# Patient Record
Sex: Male | Born: 1950 | Race: White | Hispanic: No | Smoking: Never smoker
Health system: Southern US, Community
[De-identification: ages and names within clinical notes are randomized; demographics above are authoritative.]

## PROBLEM LIST (undated history)

## (undated) DIAGNOSIS — G8929 Other chronic pain: Secondary | ICD-10-CM

## (undated) DIAGNOSIS — E785 Hyperlipidemia, unspecified: Secondary | ICD-10-CM

## (undated) DIAGNOSIS — E119 Type 2 diabetes mellitus without complications: Secondary | ICD-10-CM

## (undated) DIAGNOSIS — I480 Paroxysmal atrial fibrillation: Secondary | ICD-10-CM

## (undated) DIAGNOSIS — I809 Phlebitis and thrombophlebitis of unspecified site: Secondary | ICD-10-CM

## (undated) DIAGNOSIS — M549 Dorsalgia, unspecified: Secondary | ICD-10-CM

## (undated) DIAGNOSIS — I2510 Atherosclerotic heart disease of native coronary artery without angina pectoris: Secondary | ICD-10-CM

## (undated) DIAGNOSIS — I4892 Unspecified atrial flutter: Secondary | ICD-10-CM

## (undated) DIAGNOSIS — I10 Essential (primary) hypertension: Secondary | ICD-10-CM

## (undated) DIAGNOSIS — I8390 Asymptomatic varicose veins of unspecified lower extremity: Secondary | ICD-10-CM

## (undated) HISTORY — DX: Phlebitis and thrombophlebitis of unspecified site: I80.9

## (undated) HISTORY — DX: Other chronic pain: G89.29

## (undated) HISTORY — DX: Essential (primary) hypertension: I10

## (undated) HISTORY — PX: CHOLECYSTECTOMY: SHX55

## (undated) HISTORY — DX: Type 2 diabetes mellitus without complications: E11.9

## (undated) HISTORY — DX: Dorsalgia, unspecified: M54.9

## (undated) HISTORY — DX: Paroxysmal atrial fibrillation: I48.0

## (undated) HISTORY — DX: Hyperlipidemia, unspecified: E78.5

## (undated) HISTORY — DX: Unspecified atrial flutter: I48.92

## (undated) HISTORY — DX: Asymptomatic varicose veins of unspecified lower extremity: I83.90

## (undated) HISTORY — DX: Atherosclerotic heart disease of native coronary artery without angina pectoris: I25.10

---

## 2002-03-19 HISTORY — PX: CORONARY ARTERY BYPASS GRAFT: SHX141

## 2003-01-12 ENCOUNTER — Inpatient Hospital Stay (HOSPITAL_COMMUNITY): Admission: AD | Admit: 2003-01-12 | Discharge: 2003-01-22 | Payer: Self-pay

## 2003-01-13 ENCOUNTER — Encounter: Payer: Self-pay | Admitting: Cardiology

## 2003-04-05 ENCOUNTER — Inpatient Hospital Stay (HOSPITAL_COMMUNITY): Admission: EM | Admit: 2003-04-05 | Discharge: 2003-04-06 | Payer: Self-pay | Admitting: *Deleted

## 2004-12-14 ENCOUNTER — Emergency Department (HOSPITAL_COMMUNITY): Admission: EM | Admit: 2004-12-14 | Discharge: 2004-12-14 | Payer: Self-pay | Admitting: Emergency Medicine

## 2004-12-19 ENCOUNTER — Ambulatory Visit: Payer: Self-pay | Admitting: Cardiology

## 2006-03-19 HISTORY — PX: CARDIAC CATHETERIZATION: SHX172

## 2006-12-16 ENCOUNTER — Ambulatory Visit: Payer: Self-pay | Admitting: *Deleted

## 2006-12-16 ENCOUNTER — Inpatient Hospital Stay (HOSPITAL_COMMUNITY): Admission: EM | Admit: 2006-12-16 | Discharge: 2006-12-18 | Payer: Self-pay | Admitting: Emergency Medicine

## 2006-12-17 ENCOUNTER — Encounter: Payer: Self-pay | Admitting: Cardiology

## 2006-12-17 ENCOUNTER — Ambulatory Visit: Payer: Self-pay | Admitting: Surgery

## 2007-01-09 ENCOUNTER — Ambulatory Visit: Payer: Self-pay | Admitting: Cardiology

## 2007-12-16 ENCOUNTER — Emergency Department (HOSPITAL_COMMUNITY): Admission: EM | Admit: 2007-12-16 | Discharge: 2007-12-16 | Payer: Self-pay | Admitting: Emergency Medicine

## 2008-07-12 ENCOUNTER — Emergency Department (HOSPITAL_COMMUNITY): Admission: EM | Admit: 2008-07-12 | Discharge: 2008-07-12 | Payer: Self-pay | Admitting: Emergency Medicine

## 2008-07-12 ENCOUNTER — Encounter: Payer: Self-pay | Admitting: Cardiology

## 2008-07-30 ENCOUNTER — Encounter: Payer: Self-pay | Admitting: Cardiology

## 2008-07-30 ENCOUNTER — Ambulatory Visit: Payer: Self-pay | Admitting: Cardiology

## 2008-08-02 ENCOUNTER — Encounter: Payer: Self-pay | Admitting: Cardiology

## 2008-08-02 ENCOUNTER — Ambulatory Visit: Payer: Self-pay | Admitting: Cardiology

## 2008-08-03 ENCOUNTER — Encounter: Payer: Self-pay | Admitting: Cardiology

## 2008-08-05 ENCOUNTER — Ambulatory Visit: Payer: Self-pay | Admitting: Cardiology

## 2008-08-06 ENCOUNTER — Ambulatory Visit: Payer: Self-pay | Admitting: Cardiology

## 2008-12-03 ENCOUNTER — Encounter: Admission: RE | Admit: 2008-12-03 | Discharge: 2008-12-03 | Payer: Self-pay | Admitting: Endocrinology

## 2009-01-03 DIAGNOSIS — I2510 Atherosclerotic heart disease of native coronary artery without angina pectoris: Secondary | ICD-10-CM

## 2009-01-03 DIAGNOSIS — R609 Edema, unspecified: Secondary | ICD-10-CM | POA: Insufficient documentation

## 2009-03-19 DIAGNOSIS — I4892 Unspecified atrial flutter: Secondary | ICD-10-CM

## 2009-03-19 HISTORY — DX: Unspecified atrial flutter: I48.92

## 2009-06-17 ENCOUNTER — Ambulatory Visit: Payer: Self-pay | Admitting: Cardiology

## 2009-06-17 DIAGNOSIS — E782 Mixed hyperlipidemia: Secondary | ICD-10-CM

## 2009-09-02 ENCOUNTER — Telehealth (INDEPENDENT_AMBULATORY_CARE_PROVIDER_SITE_OTHER): Payer: Self-pay | Admitting: *Deleted

## 2009-09-21 ENCOUNTER — Encounter: Payer: Self-pay | Admitting: Cardiology

## 2009-09-26 ENCOUNTER — Encounter (INDEPENDENT_AMBULATORY_CARE_PROVIDER_SITE_OTHER): Payer: Self-pay | Admitting: Surgery

## 2009-09-26 ENCOUNTER — Encounter: Payer: Self-pay | Admitting: Cardiology

## 2009-09-26 ENCOUNTER — Ambulatory Visit: Payer: Self-pay | Admitting: Cardiology

## 2009-09-26 ENCOUNTER — Ambulatory Visit (HOSPITAL_COMMUNITY): Admission: RE | Admit: 2009-09-26 | Discharge: 2009-09-28 | Payer: Self-pay | Admitting: Surgery

## 2009-10-06 ENCOUNTER — Ambulatory Visit: Payer: Self-pay | Admitting: Cardiology

## 2009-10-06 ENCOUNTER — Telehealth (INDEPENDENT_AMBULATORY_CARE_PROVIDER_SITE_OTHER): Payer: Self-pay | Admitting: *Deleted

## 2009-10-06 DIAGNOSIS — I4892 Unspecified atrial flutter: Secondary | ICD-10-CM

## 2009-10-06 DIAGNOSIS — I10 Essential (primary) hypertension: Secondary | ICD-10-CM | POA: Insufficient documentation

## 2009-10-11 ENCOUNTER — Ambulatory Visit: Payer: Self-pay | Admitting: Cardiology

## 2009-10-11 LAB — CONVERTED CEMR LAB: POC INR: 1.2

## 2009-10-18 ENCOUNTER — Ambulatory Visit: Payer: Self-pay | Admitting: Cardiology

## 2009-10-18 LAB — CONVERTED CEMR LAB: POC INR: 1.6

## 2009-10-19 ENCOUNTER — Emergency Department (HOSPITAL_COMMUNITY): Admission: EM | Admit: 2009-10-19 | Discharge: 2009-10-20 | Payer: Self-pay | Admitting: Emergency Medicine

## 2009-10-25 ENCOUNTER — Ambulatory Visit: Payer: Self-pay | Admitting: Cardiology

## 2009-10-25 LAB — CONVERTED CEMR LAB: POC INR: 4.5

## 2009-11-01 ENCOUNTER — Ambulatory Visit: Payer: Self-pay | Admitting: Cardiology

## 2009-11-01 LAB — CONVERTED CEMR LAB: POC INR: 2.1

## 2009-11-11 ENCOUNTER — Ambulatory Visit: Payer: Self-pay | Admitting: Cardiology

## 2009-11-11 ENCOUNTER — Encounter: Payer: Self-pay | Admitting: Cardiology

## 2009-11-11 LAB — CONVERTED CEMR LAB: POC INR: 4

## 2009-11-18 ENCOUNTER — Ambulatory Visit: Payer: Self-pay | Admitting: Cardiology

## 2009-11-18 ENCOUNTER — Encounter (INDEPENDENT_AMBULATORY_CARE_PROVIDER_SITE_OTHER): Payer: Self-pay | Admitting: *Deleted

## 2009-11-18 DIAGNOSIS — M109 Gout, unspecified: Secondary | ICD-10-CM

## 2009-11-18 LAB — CONVERTED CEMR LAB: POC INR: 3.4

## 2009-11-24 ENCOUNTER — Ambulatory Visit (HOSPITAL_COMMUNITY): Admission: RE | Admit: 2009-11-24 | Discharge: 2009-11-24 | Payer: Self-pay | Admitting: Cardiology

## 2009-11-24 ENCOUNTER — Ambulatory Visit: Payer: Self-pay | Admitting: Cardiology

## 2009-11-28 ENCOUNTER — Encounter: Payer: Self-pay | Admitting: Physician Assistant

## 2009-11-29 ENCOUNTER — Ambulatory Visit: Payer: Self-pay | Admitting: Cardiology

## 2009-11-29 LAB — CONVERTED CEMR LAB: POC INR: 3

## 2009-12-06 ENCOUNTER — Ambulatory Visit: Payer: Self-pay | Admitting: Cardiology

## 2009-12-06 LAB — CONVERTED CEMR LAB: POC INR: 2

## 2009-12-22 ENCOUNTER — Encounter: Payer: Self-pay | Admitting: Cardiology

## 2009-12-22 ENCOUNTER — Ambulatory Visit: Payer: Self-pay | Admitting: Cardiology

## 2009-12-22 LAB — CONVERTED CEMR LAB: POC INR: 2.4

## 2009-12-23 ENCOUNTER — Encounter: Payer: Self-pay | Admitting: Cardiology

## 2010-01-03 ENCOUNTER — Ambulatory Visit: Payer: Self-pay | Admitting: Cardiology

## 2010-01-03 LAB — CONVERTED CEMR LAB: POC INR: 2.2

## 2010-02-14 ENCOUNTER — Ambulatory Visit: Payer: Self-pay | Admitting: Cardiology

## 2010-02-14 LAB — CONVERTED CEMR LAB: POC INR: 1.2

## 2010-02-28 ENCOUNTER — Ambulatory Visit: Payer: Self-pay | Admitting: Cardiology

## 2010-03-28 ENCOUNTER — Ambulatory Visit: Admission: RE | Admit: 2010-03-28 | Discharge: 2010-03-28 | Payer: Self-pay | Source: Home / Self Care

## 2010-04-10 ENCOUNTER — Encounter: Payer: Self-pay | Admitting: Cardiology

## 2010-04-18 NOTE — Assessment & Plan Note (Signed)
Summary: EPH-PER [name] [name] NEEDS TOBE EVAL   Primary Provider:  Dr. [name]   History of Present Illness: 60 year old male presents for followup. He was recently seen in consultation by Dr. Daleen Squibb on 11 July related to atrial fibrillation documented in the setting of an elective laparoscopic cholecystectomy. I cannot locate a discharge summary, however the patient tells me that no new cardiac medications were added, and followup was scheduled with me to discuss further options for management.  He reports recuperating from surgery with some mild incisional discomfort, normal appetite, no bleeding issues. He has followup soon with his surgeon.  Recent labs from 6 July showed hemoglobin 14.9, platelets 153, sodium 135, potassium 5.1, BUN 15, creatinine 1.0, AST 37, ALT 31.   We discussed the implications and treatment options for atrial fibrillation and atrial flutter. His CHADS2 score is 2. This is his first documented episode, and fortunately he is not particularly symptomatic.  Preventive Screening-Counseling & Management  Alcohol-Tobacco     Smoking Status: never  Current Medications (verified): 1)  Lisinopril 20 Mg Tabs (Lisinopril) .... Take One Tablet By Mouth Daily 2)  Furosemide 40 Mg Tabs (Furosemide) .... Take One Tablet By Mouth Daily As Needed 3)  Metoprolol Tartrate 50 Mg Tabs (Metoprolol Tartrate) .... Take One Tablet By Mouth Twice A Day 4)  Ecotrin 325 Mg Tbec (Aspirin) .... Take 1 Tablet By Mouth Once A Day 5)  Metformin Hcl 500 Mg Tabs (Metformin Hcl) .... Take 1 Tablet By Mouth Two Times A Day 6)  Nitrostat 0.4 Mg Subl (Nitroglycerin) .... Use As Directed 7)  Actos 45 Mg Tabs (Pioglitazone Hcl) .... Take 1 Tablet By Mouth Once A Day  Allergies (verified): 1)  * Shellfish  Comments:  Nurse/Medical Assistant: The patient's medications and allergies were verbally reviewed with the patient and were updated in the Medication and Allergy Lists.  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         No palpitations. Some residual incisional pain. No bleeding problems. Otherwise reviewed and negative.  Vital Signs:  Patient profile:   60 year old male Height:      70 inches Weight:      242 pounds Pulse rate:   71 / minute BP sitting:   125 / 79  (left arm) Cuff size:   large  Vitals Entered By: Carlye Grippe (October 06, 2009 10:30 AM)  Physical Exam  Additional Exam:  Obese male no acute distress. HEENT: Conjunctiva and lids are normal, oropharynx clear. Neck: Supple, no elevated JVP or bruits. Lungs: Clear auscultation, nonlabored. Cardiac: Irregularly irregular rate and rhythm, no S3. Abdomen: Obese, status post laparoscopic cholecystectomy, bowel sounds present. Extremities: Venous stasis, distal pulses one plus. Skin: Warm and dry. Musculoskeletal: No kyphosis. Neuropsychiatric: Alert and oriented x3, affect appropriate.   CXR  Procedure date:  09/21/2009  Findings:      CHEST - 2 VIEW    Comparison: 07/12/2008    Findings: Stable cardiomegaly/post  CABG.  No congestive heart   failure or active disease.    IMPRESSION:   Cardiomegaly/post CABG.  No active disease or interval change.  Echocardiogram  Procedure date:  08/05/2008  Findings:      Mild LVH with LVEF 55-60%, mild to moderate left atrial enlargement, mild mitral regurgitation, mild aortic  leaflet calcification, mild aortic calcification, normal RV function, mild tricuspid regurgitation, RVSP 30 mm mercury.  EKG  Procedure date:  10/06/2009  Findings:      Atrial flutter with variable block, heart rate 78 beats per minute, nonspecific ST-T changes.  Impression & Recommendations:  Problem # 1:  ATRIAL FLUTTER (ICD-427.32)  Largely asymptomatic at this time with no sense of palpitations. Patient was reported to develop perioperative atrial fibrillation around the time of elective laparoscopic cholecystectomy in mid July. Presumably this has modified to atrial flutter in the meanwhile. His atrial arrhythmia has persisted. His CHADS2 score is 2. We discussed implications and treatment options for his atrial arrhythmias. At this point our plan is to initiate Coumadin, reduce aspirin to 81 mg daily, and follow PT/INR checks on a weekly basis, in anticipation of elective cardioversion attempt in 4 weeks. It is not entirely clear whether he will need continued anticoagulant coagulation long term, in light of the fact that this is his first documented episode of atrial fibrillation or flutter. We can readdress this as needed. Coumadin followup will be established in our Coumadin clinic. He would like to have his cardioversion done at [facility].  His updated medication list for this problem includes:    Metoprolol Tartrate 50 Mg Tabs (Metoprolol tartrate) .Marland Kitchen... Take one tablet by mouth twice a day    Aspirin 81 Mg Tbec (Aspirin) .Marland Kitchen... Take one tablet by mouth daily    Warfarin Sodium 5 Mg Tabs (Warfarin sodium) ..... Use as directed by anticoagulation clinic  Problem # 2:  CAD, NATIVE VESSEL (ICD-414.01)  Symptomatically stable without active angina.  His updated medication list for this problem includes:    Lisinopril 20 Mg Tabs (Lisinopril) .Marland Kitchen... Take one tablet by mouth daily    Metoprolol Tartrate 50 Mg Tabs (Metoprolol tartrate) .Marland Kitchen... Take one tablet by mouth twice a day     Aspirin 81 Mg Tbec (Aspirin) .Marland Kitchen... Take one tablet by mouth daily    Nitrostat 0.4 Mg Subl (Nitroglycerin) ..... Use as directed    Warfarin Sodium 5 Mg Tabs (Warfarin sodium) ..... Use as directed by anticoagulation clinic  Orders: EKG w/ Interpretation (93000)  Problem # 3:  ESSENTIAL HYPERTENSION, BENIGN (ICD-401.1)  Blood pressure well controlled today.  His updated medication list for this problem includes:    Lisinopril 20 Mg Tabs (Lisinopril) .Marland Kitchen... Take one tablet by mouth daily    Furosemide 40 Mg Tabs (Furosemide) .Marland Kitchen... Take one tablet by mouth daily as needed    Metoprolol Tartrate 50 Mg Tabs (Metoprolol tartrate) .Marland Kitchen... Take one tablet by mouth twice a day    Aspirin 81 Mg Tbec (Aspirin) .Marland Kitchen... Take one tablet by mouth daily  Patient Instructions: 1)  Start Coumadin (warfarin) today. Take 5mg  by mouth every afternoon (around 4 or 5pm).  2)  Return for Coumadin Clinic visit on Tuesday, October 11, 2009 at 1:30pm. 3)  Return for appt with Dr. Diona Browner on Friday, November 18, 2009 at 1:40pm. 4)  Decrease Aspirin to 81mg  by mouth once daily. Prescriptions: WARFARIN SODIUM 5 MG TABS (WARFARIN SODIUM) Use as directed by Anticoagulation Clinic  #45 x 1   Entered by:  Cyril Loosen, RN, BSN   Authorized by:   Loreli Slot, MD, [facility]   Signed by:   Cyril Loosen, RN, BSN on 10/06/2009   Method used:   Electronically to        [facility]  [address] [id number] (retail)       [address]       [place], [place]  [address]       Ph: [phone] or [phone]       Fax: [phone]   RxID:   [id number]

## 2010-04-18 NOTE — Medication Information (Signed)
Summary: ccr-lr  Anticoagulant Therapy  Managed by: Vashti Hey RN PCP: Dr. [name] Supervising MD: Antoine Poche MD, Fayrene Fearing Indication 1: Atrial Fibrillation Indication 2: Atrial Flutter Lab Used: [facility]  Site: [place] INR POC 4.5  Dietary changes: no    Health status changes: yes       Details: had gout flare up and went to [facility]   Bleeding/hemorrhagic complications: no    Recent/future hospitalizations: no    Any changes in medication regimen? yes       Details: Been taking prednisone taper pack for gout   Has 2 days left  Recent/future dental: no  Any missed doses?: no       Is patient compliant with meds? yes       Allergies: 1)  * Shellfish  Anticoagulation Management History:      The patient is taking warfarin and comes in today for a routine follow up visit.  He is being anticoagulated due to Atrial Fibrillation /flutter.  Positive risk factors for bleeding include presence of serious comorbidities.  Negative risk factors for bleeding include an age less than 74 years old, no history of CVA/TIA, and no history of GI bleeding.  The bleeding index is 'intermediate risk'.  Positive CHADS2 values include History of HTN and History of Diabetes.  Negative CHADS2 values include History of CHF, Age > 74 years old, and Prior Stroke/CVA/TIA.  The start date was 10/06/2009.  Anticoagulation responsible Alverta Caccamo: Antoine Poche MD, Fayrene Fearing.  INR POC: 4.5.  Cuvette Lot#: [id number].    Anticoagulation Management Assessment/Plan:      The patient's current anticoagulation dose is Warfarin sodium 5 mg tabs: Use as directed by Anticoagulation Clinic.  The target INR is 2.0-3.0.  The next INR is due 11/01/2009.  Anticoagulation instructions were given to patient.  Results were reviewed/authorized by Vashti Hey RN.  He was notified by Vashti Hey RN.         Prior Anticoagulation Instructions: INR 1.6 Increase coumadin to 7.5mg  once daily except 5mg  on Sundays  Current  Anticoagulation Instructions: INR 4.5 Hold coumadin tonight, take 1/2 tablet tomorrow night then 7.5mg  once daily except 5mg  on Thursday and Saturday

## 2010-04-18 NOTE — Medication Information (Signed)
Summary: Coumadin Clinic  Anticoagulant Therapy  Managed by: Cyril Loosen, RN PCP: Dr. Perrin Maltese MD: Andee Lineman MD, Michelle Piper Indication 1: Atrial Fibrillation Indication 2: Atrial Flutter Lab Used: [facility] [place] Site: [place] INR POC 2.4  Dietary changes: no    Health status changes: no    Bleeding/hemorrhagic complications: no    Recent/future hospitalizations: no    Any changes in medication regimen? no    Recent/future dental: no  Any missed doses?: yes     Details: pt has been taking 5mg  qd   Is patient compliant with meds? no     Details: cancelled last office visit and decreased coumadin dose   Allergies: 1)  * Shellfish  Anticoagulation Management History:      The patient is taking warfarin and comes in today for a routine follow up visit.  He is being anticoagulated because of Atrial Fibrillation /flutter.  Positive risk factors for bleeding include presence of serious comorbidities.  Negative risk factors for bleeding include an age less than 30 years old, no history of CVA/TIA, and no history of GI bleeding.  The bleeding index is 'intermediate risk'.  Positive CHADS2 values include History of HTN and History of Diabetes.  Negative CHADS2 values include History of CHF, Age > 4 years old, and Prior Stroke/CVA/TIA.  The start date was 10/06/2009.  Anticoagulation responsible provider: Andee Lineman MD, Michelle Piper.  INR POC: 2.4.  Cuvette Lot#: [id number].    Anticoagulation Management Assessment/Plan:      The patient's current anticoagulation dose is Warfarin sodium 5 mg tabs: Use as directed by Anticoagulation Clinic.  The target INR is 2.0-3.0.  The next INR is due 01/03/2010.  Anticoagulation instructions were given to patient.  Results were reviewed/authorized by Cyril Loosen, RN.  He was notified by Vashti Hey RN.         Prior Anticoagulation Instructions: INR 2.0 Take coumadin 7.5mg  tonight then resume 5mg  once daily except 7.5mg  on  Thursdays  Current Anticoagulation Instructions: INR 2.4 Pt has been taking 5mg  once daily  Continue 5mg  once daily

## 2010-04-18 NOTE — Medication Information (Signed)
Summary: ONG:EXBMW 10/06/09-JM  Anticoagulant Therapy  Managed by: Vashti Hey RN PCP: Dr. [name] Supervising MD: Andee Lineman MD, Michelle Piper Indication 1: Atrial Fibrillation Indication 2: Atrial Flutter Lab Used: [facility] [place] Site: [place] INR POC 1.2  Dietary changes: no    Health status changes: no    Bleeding/hemorrhagic complications: no    Recent/future hospitalizations: yes       Details: pending DCCV    new onset atrial fib/flutter in hospital  Any changes in medication regimen? yes       Details: started on coumadin 5mg  qd 10/06/09   Has 5mg  tablet  Recent/future dental: no  Any missed doses?: no       Is patient compliant with meds? yes       Allergies: 1)  * Shellfish  Anticoagulation Management History:      The patient comes in today for his initial visit for anticoagulation therapy.  He is being anticoagulated due to Atrial Fibrillation /flutter.  Positive risk factors for bleeding include presence of serious comorbidities.  Negative risk factors for bleeding include an age less than 12 years old, no history of CVA/TIA, and no history of GI bleeding.  The bleeding index is 'intermediate risk'.  Positive CHADS2 values include History of HTN and History of Diabetes.  Negative CHADS2 values include History of CHF, Age > 6 years old, and Prior Stroke/CVA/TIA.  The start date was 10/06/2009.  Anticoagulation responsible provider: Andee Lineman MD, Michelle Piper.  INR POC: 1.2.    Anticoagulation Management Assessment/Plan:      The patient's current anticoagulation dose is Warfarin sodium 5 mg tabs: Use as directed by Anticoagulation Clinic.  The target INR is 2.0-3.0.  The next INR is due 10/18/2009.  Anticoagulation instructions were given to patient.  Results were reviewed/authorized by Vashti Hey RN.  He was notified by Vashti Hey RN.        Coagulation management information includes: Pending DCCV when theraputic x 4 weeks.  Current Anticoagulation Instructions: INR  1.2 Increase coumadin to 5mg  once daily except 7.5mg  on T,Th, Sat

## 2010-04-18 NOTE — Medication Information (Signed)
Summary: ccr  Anticoagulant Therapy  Managed by: Vashti Hey, RN PCP: Dr. Perrin Maltese MD: Andee Lineman MD, Michelle Piper Indication 1: Atrial Fibrillation Indication 2: Atrial Flutter Lab Used: [facility] [place] Site: [place] INR POC 1.2  Dietary changes: no    Health status changes: no    Bleeding/hemorrhagic complications: no    Recent/future hospitalizations: no    Any changes in medication regimen? no    Recent/future dental: no  Any missed doses?: yes     Details: Pt admits to missing 2 doses  Is patient compliant with meds? yes       Allergies: 1)  * Shellfish  Anticoagulation Management History:      The patient is taking warfarin and comes in today for a routine follow up visit.  He is being anticoagulated because of Atrial Fibrillation /flutter.  Positive risk factors for bleeding include presence of serious comorbidities.  Negative risk factors for bleeding include an age less than 62 years old, no history of CVA/TIA, and no history of GI bleeding.  The bleeding index is 'intermediate risk'.  Positive CHADS2 values include History of HTN and History of Diabetes.  Negative CHADS2 values include History of CHF, Age > 62 years old, and Prior Stroke/CVA/TIA.  The start date was 10/06/2009.  Anticoagulation responsible provider: Andee Lineman MD, Michelle Piper.  INR POC: 1.2.  Cuvette Lot#: [id number].    Anticoagulation Management Assessment/Plan:      The patient's current anticoagulation dose is Warfarin sodium 5 mg tabs: Use as directed by Anticoagulation Clinic.  The target INR is 2.0-3.0.  The next INR is due 02/28/2010.  Anticoagulation instructions were given to patient.  Results were reviewed/authorized by Vashti Hey, RN.  He was notified by Vashti Hey RN.         Prior Anticoagulation Instructions: INR 2.2 Continue coumadin 5mg  once daily   Current Anticoagulation Instructions: INR 1.2 Take coumadin 2 tablets tonight and tomorrow night then resume 1 tablet once daily

## 2010-04-18 NOTE — Medication Information (Signed)
Summary: ccr-lr  Anticoagulant Therapy  Managed by: Vashti Hey RN PCP: Dr. [name] Supervising MD: Antoine Poche MD, Fayrene Fearing Indication 1: Atrial Fibrillation Indication 2: Atrial Flutter Lab Used: [facility] [place] Site: [place] INR POC 2.1  Dietary changes: no    Health status changes: no    Bleeding/hemorrhagic complications: no    Recent/future hospitalizations: yes       Details: pending DCCV  Any changes in medication regimen? no    Recent/future dental: no  Any missed doses?: no       Is patient compliant with meds? yes       Allergies: 1)  * Shellfish  Anticoagulation Management History:      The patient is taking warfarin and comes in today for a routine follow up visit.  He is being anticoagulated due to Atrial Fibrillation /flutter.  Positive risk factors for bleeding include presence of serious comorbidities.  Negative risk factors for bleeding include an age less than 23 years old, no history of CVA/TIA, and no history of GI bleeding.  The bleeding index is 'intermediate risk'.  Positive CHADS2 values include History of HTN and History of Diabetes.  Negative CHADS2 values include History of CHF, Age > 8 years old, and Prior Stroke/CVA/TIA.  The start date was 10/06/2009.  Anticoagulation responsible provider: Antoine Poche MD, Fayrene Fearing.  INR POC: 2.1.  Cuvette Lot#: [id number].    Anticoagulation Management Assessment/Plan:      The patient's current anticoagulation dose is Warfarin sodium 5 mg tabs: Use as directed by Anticoagulation Clinic.  The target INR is 2.0-3.0.  The next INR is due 11/11/2009.  Anticoagulation instructions were given to patient.  Results were reviewed/authorized by Vashti Hey RN.  He was notified by Vashti Hey RN.         Prior Anticoagulation Instructions: INR 4.5 Hold coumadin tonight, take 1/2 tablet tomorrow night then 7.5mg  once daily except 5mg  on Thursday and Saturday  Current Anticoagulation Instructions: INR 2.1 Take coumadin  7.5mg  once daily except 5mg  on Mondays and Fridays

## 2010-04-18 NOTE — Assessment & Plan Note (Signed)
Summary: 1 MO F/U, POST DCCV-JM   Visit Type:  Follow-up Primary Provider:  Dr. Leandrew Koyanagi   History of Present Illness: 60 year old male presents for followup. he was seen back in early September, with plans for subsequent elective cardioversion of atrial fibrillation/flutter. Cardioversion was successful, performed by Dr. Jens Som on 8 September, with restoration of sinus rhythm. He remains on Coumadin.  He missed his last scheduled Coumadin check. He reports no bleeding problems. He reports plans to establish primary care with Dr. Leandrew Koyanagi. He has not yet seen him.  He has had no chest pain. Reports NYHA class II dyspnea on exertion. He does state that he has had some balance problems, also tingling in his feet, likely neuropathy. He was also wondering about a prior stroke, although he has had no memory problems, speech deficits, or focal motor weakness. We discussed this some today. He has a CHADS2 score of 2, and at this point I recommended that he continue on Coumadin. He asked about Pradaxa as an alternative, which would be a reasonable choice as well, assuming he is comfortable with cost difference. He plans to check into this with his insurance company.  Preventive Screening-Counseling & Management  Alcohol-Tobacco     Smoking Status: never  Current Medications (verified): 1)  Lisinopril 20 Mg Tabs (Lisinopril) .... Take One Tablet By Mouth Daily 2)  Furosemide 40 Mg Tabs (Furosemide) .... Take One Tablet By Mouth Daily As Needed 3)  Metoprolol Tartrate 50 Mg Tabs (Metoprolol Tartrate) .... Take One Tablet By Mouth Twice A Day 4)  Metformin Hcl 500 Mg Tabs (Metformin Hcl) .... Take 1 Tablet By Mouth Two Times A Day 5)  Nitrostat 0.4 Mg Subl (Nitroglycerin) .... Use As Directed 6)  Actos 45 Mg Tabs (Pioglitazone Hcl) .... Take 1 Tablet By Mouth Once A Day 7)  Warfarin Sodium 5 Mg Tabs (Warfarin Sodium) .... Use As Directed By Anticoagulation Clinic 8)  Colcrys 0.6 Mg Tabs (Colchicine)  .... Take 1 Tablet By Mouth Once A Day  Allergies (verified): 1)  * Shellfish  Comments:  Nurse/Medical Assistant: The patient's medications and allergies were verbally reviewed with the patient and were updated in the Medication and Allergy Lists.  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 The left ventricular chamber size is normal. Mild concentric left         ventricular hypertrophy is observed. Global left ventricular wall         motion and contractility are within normal limits. The estimated         ejection fraction is 55-60%.          2. The left atrium is mild to moderately dilated.          3. There is mild mitral regurgitation.          4. Mild aortic leaflet calcification is visualized. There is no         evidence of aortic regurgitation. The mean gradient of the aortic  valve is 3 mmHg.          5. The right ventricle is mildly dilated.  The right ventricular         global systolic function is normal.         6. There is mild tricuspid regurgitation. The right ventricular         systolic pressure is calculated at 30 mmHg.          7. There is no pericardial effusion. (08/05/2008)    Review of Systems  The patient denies anorexia, fever, chest pain, syncope, dyspnea on exertion, hemoptysis, melena, and hematochezia.         Otherwise reviewed and negative except as outlined above.  Vital Signs:  Patient profile:   60 year old  male Height:      70 inches Weight:      237 pounds Pulse rate:   62 / minute BP sitting:   156 / 83  (left arm) Cuff size:   large  Vitals Entered By: Carlye Grippe (December 22, 2009 1:24 PM)  Physical Exam  Additional Exam:  GEN: 60 year old male, overweight, sitting up, in no distress HEENT: NCAT,PERRLA,EOMI NECK: palpable pulses, no bruits; no JVD; no TM LUNGS: CTA bilaterally HEART: regular rate and rhythm (S1S2); no significant murmurs; no rubs; no gallops ABD: soft, NT; intact BS EXT: trace bilateral lower extremity edema SKIN: warm, dry MUSC: no obvious deformity NEURO: A/O (x3)     EKG  Procedure date:  12/22/2009  Findings:      Sinus rhythm in 62 beats per minute with nonspecific T-wave changes.  Impression & Recommendations:  Problem # 1:  CAD, NATIVE VESSEL (ICD-414.01)  Symptomatically stable on present medical regimen. Followup electrocardiogram is reviewed above. No changes made to medical regimen.   His updated medication list for this problem includes:    Lisinopril 20 Mg Tabs (Lisinopril) .Marland Kitchen... Take one tablet by mouth daily    Metoprolol Tartrate 50 Mg Tabs (Metoprolol tartrate) .Marland Kitchen... Take one tablet by mouth twice a day    Nitrostat 0.4 Mg Subl (Nitroglycerin) ..... Use as directed    Warfarin Sodium 5 Mg Tabs (Warfarin sodium) ..... Use as directed by anticoagulation clinic  Orders: EKG w/ Interpretation (93000)  Problem # 2:  ATRIAL FLUTTER (ICD-427.32)  Maintaining sinus rhythm following elective cardioversion in early September. At this point will remain on Coumadin with CHADS2 score of 2, possibly transition to Pradaxa once the patient has a chance check into the cost. Has history of normal renal function. He would need a followup CBC and BMET.  His updated medication list for this problem includes:    Metoprolol Tartrate 50 Mg Tabs (Metoprolol tartrate) .Marland Kitchen... Take one tablet by mouth twice a day    Warfarin Sodium 5 Mg Tabs (Warfarin  sodium) ..... Use as directed by anticoagulation clinic  Problem # 3:  ESSENTIAL HYPERTENSION, BENIGN (ICD-401.1)  Blood pressure up today. We have discussed weight loss and sodium restriction. Plans to follow up with Dr. Leandrew Koyanagi soon. He may need further medicine up titration.  His updated medication list for this problem includes:    Lisinopril 20 Mg Tabs (Lisinopril) .Marland Kitchen... Take one tablet by mouth daily    Furosemide 40 Mg Tabs (Furosemide) .Marland Kitchen... Take one tablet by mouth daily as needed    Metoprolol Tartrate 50 Mg Tabs (Metoprolol tartrate) .Marland Kitchen... Take one tablet by mouth twice a day  Problem # 4:  MIXED HYPERLIPIDEMIA (ICD-272.2)  Should have  followup lipid profile once established with Dr. Leandrew Koyanagi.  Patient Instructions: 1)  Your physician wants you to follow-up in: 3 months. You will receive a reminder letter in the mail one-two months in advance. If you don't receive a letter, please call our office to schedule the follow-up appointment. 2)  Your physician recommends that you continue on your current medications as directed. Please refer to the Current Medication list given to you today.

## 2010-04-18 NOTE — Progress Notes (Signed)
Summary: PHONE: PRE OP CLEARanCE  Phone Note From Other Clinic   Caller: [facility] DR. Jamey Ripa Request: Talk with Provider Details for Reason: CLEARANCE FOR GALLBLADDER SURGERY-PAT OF DR. MCDOWELL Summary of Call: Mr. Mayhall needs to have gallbladder surgery. Dr. Jamey Ripa needs to know how to manage his ASA 325mg  and does patient need to have office visit with Dr. Diona Browner before the surgery. Office # [phone] Fax # [phone] attention: Edie Initial call taken by: Zachary George,  September 02, 2009 10:42 AM  Follow-up for Phone Call        Pt last seen 06/17/09. He has a reminder for 12/17/09. Pt states he's been doing fine from a cardiac standpoint. He's not having any problems except for his gallbladder. He is aware a message will be sent to Dr. Diona Browner for further review and surgeon will be notified of his response. Follow-up by: Cyril Loosen, RN, BSN,  September 02, 2009 2:30 PM  Additional Follow-up for Phone Call Additional follow up Details #1::        Patient recently seen 4/11 and clinically stable at that time.  No clear need for further cardiac evaluation for elective gallbladder surgery unless new cardiac symptoms in the interim.  Should be able to temporarily hold aspirin for surgery as needed.  We can see him in consultation as needed. Additional Follow-up by: Loreli Slot, MD, [facility],  September 02, 2009 2:34 PM

## 2010-04-18 NOTE — Medication Information (Signed)
Summary: ccr-lr  Anticoagulant Therapy  Managed by: Vashti Hey RN PCP: Dr. [name] Supervising MD: Antoine Poche MD, Fayrene Fearing Indication 1: Atrial Fibrillation Indication 2: Atrial Flutter Lab Used: [facility] [place] Site: [place] INR POC 2.0  Dietary changes: no    Health status changes: no    Bleeding/hemorrhagic complications: no    Recent/future hospitalizations: no    Any changes in medication regimen? no    Recent/future dental: no  Any missed doses?: no       Is patient compliant with meds? yes       Allergies: 1)  * Shellfish  Anticoagulation Management History:      The patient is taking warfarin and comes in today for a routine follow up visit.  He is being anticoagulated because of Atrial Fibrillation /flutter.  Positive risk factors for bleeding include presence of serious comorbidities.  Negative risk factors for bleeding include an age less than 22 years old, no history of CVA/TIA, and no history of GI bleeding.  The bleeding index is 'intermediate risk'.  Positive CHADS2 values include History of HTN and History of Diabetes.  Negative CHADS2 values include History of CHF, Age > 6 years old, and Prior Stroke/CVA/TIA.  The start date was 10/06/2009.  Anticoagulation responsible provider: Antoine Poche MD, Fayrene Fearing.  INR POC: 2.0.  Cuvette Lot#: [id number].    Anticoagulation Management Assessment/Plan:      The patient's current anticoagulation dose is Warfarin sodium 5 mg tabs: Use as directed by Anticoagulation Clinic.  The target INR is 2.0-3.0.  The next INR is due 12/16/2009.  Anticoagulation instructions were given to patient.  Results were reviewed/authorized by Vashti Hey RN.  He was notified by Vashti Hey RN.         Prior Anticoagulation Instructions: INR 3.0 Take coumadin 5mg  once daily except 7.5mg  on Thursdays  Current Anticoagulation Instructions: INR 2.0 Take coumadin 7.5mg  tonight then resume 5mg  once daily except 7.5mg  on Thursdays

## 2010-04-18 NOTE — Medication Information (Signed)
Summary: ccr-lr  Anticoagulant Therapy  Managed by: Vashti Hey RN PCP: Dr. [name] Supervising MD: Andee Lineman MD, Michelle Piper Indication 1: Atrial Fibrillation Indication 2: Atrial Flutter Lab Used: [facility] [place] Site: [place] INR POC 1.6  Dietary changes: no    Health status changes: no    Bleeding/hemorrhagic complications: no    Recent/future hospitalizations: yes       Details: pending DCCV  Any changes in medication regimen? no    Recent/future dental: no  Any missed doses?: no       Is patient compliant with meds? yes       Allergies: 1)  * Shellfish  Anticoagulation Management History:      The patient is taking warfarin and comes in today for a routine follow up visit.  He is being anticoagulated due to Atrial Fibrillation /flutter.  Positive risk factors for bleeding include presence of serious comorbidities.  Negative risk factors for bleeding include an age less than 59 years old, no history of CVA/TIA, and no history of GI bleeding.  The bleeding index is 'intermediate risk'.  Positive CHADS2 values include History of HTN and History of Diabetes.  Negative CHADS2 values include History of CHF, Age > 44 years old, and Prior Stroke/CVA/TIA.  The start date was 10/06/2009.  Anticoagulation responsible provider: Andee Lineman MD, Michelle Piper.  INR POC: 1.6.  Cuvette Lot#: [id number].    Anticoagulation Management Assessment/Plan:      The patient's current anticoagulation dose is Warfarin sodium 5 mg tabs: Use as directed by Anticoagulation Clinic.  The target INR is 2.0-3.0.  The next INR is due 10/25/2009.  Anticoagulation instructions were given to patient.  Results were reviewed/authorized by Vashti Hey RN.  He was notified by Vashti Hey RN.         Prior Anticoagulation Instructions: INR 1.2 Increase coumadin to 5mg  once daily except 7.5mg  on T,Th, Sat  Current Anticoagulation Instructions: INR 1.6 Increase coumadin to 7.5mg  once daily except 5mg  on Sundays

## 2010-04-18 NOTE — Letter (Signed)
Summary: Cardioversion/TEE Catering manager at [facility] [address]   [place], [place] [address]   Phone: [phone]  Fax: [phone]    Cardioversion / TEE Cardioversion Instructions  11/18/2009 MRN: [id number]  [name] [name] [address] [place], [place]  [address]  Dear Mr. MCDILL, You are scheduled for a Cardioversion on ____Thursday, Sept 8, 2011 ________________________ with Dr. _____Crenshaw____________________________________.   Please arrive at the [facility] at ___12 Noon__________  on the day of your procedure.  1)   DIET:  A)   Nothing to eat or drink after midnight except your medications with a sip of water.  B)   May have [name] liquid breakfast, then nothing to eat or drink after _________ a.m. / p.m.      [name] liquids include:  water, broth, Sprite, Ginger Ale, black coffee, tea (no sugar),      cranberry / grape / apple juice, jello (not red), popsicle from [name] juices (not red).  2)   Come to the [place] office on ____________________ for lab work. The lab at [facility] is open from 8:30 a.m. to 1:30 p.m. and 2:30 p.m. to 5:00 p.m. The lab at [facility] is open from 7:30 a.m. to 5:30 p.m. You do not have to be fasting.  3)   MAKE SURE YOU TAKE YOUR COUMADIN.  4)   A)   DO NOT TAKE these medications before your procedure:      ___________________________________________________________________     ___________________________________________________________________     ___________________________________________________________________  B)   YOU MAY TAKE ALL of your remaining medications with a small amount of water.    C)   START NEW medications:       ___________________________________________________________________     ___________________________________________________________________  5)  Must have a responsible person to drive you home.  6)   Bring a current list of your  medications and current insurance cards.   * Special Note:  Every effort is made to have your procedure done on time. Occasionally there are emergencies that present themselves at the hospital that may cause delays. Please be patient if a delay does occur.  * If you have any questions after you get home, please call the office at [phone].

## 2010-04-18 NOTE — Medication Information (Signed)
Summary: ccr-lr  Anticoagulant Therapy  Managed by: Vashti Hey RN PCP: Dr. [name] Supervising MD: Antoine Poche MD, Fayrene Fearing Indication 1: Atrial Fibrillation Indication 2: Atrial Flutter Lab Used: [facility] [place] Site: [place] INR POC 4.0  Dietary changes: no    Health status changes: yes       Details: C/O leg. foot and toe pain  Pt is convinced it is coming from coumadin.  Has been to ED and tx for gout with prednisone and PMD and started on colchicine   Feet and legs are no better  Pt wants to know if there is another option other than coumadin.  Discussed with Dr Diona Browner.  Can switch pt to pradaxa if he is willing to pay the cost.  After lengthy discussion pt has agreed to stay on coumadin another week until he sees Dr Diona Browner on 11/18/09.  If legs aren't better by 8/30 he will call me back.  Bleeding/hemorrhagic complications: no    Recent/future hospitalizations: no    Any changes in medication regimen? yes       Details: Started on colchicine for foot pain   States he has taken 50 tylenol since LOV  Recent/future dental: no  Any missed doses?: no       Is patient compliant with meds? yes      Comments: EKG done due to above conversation.  Pt in atrial flutter.  Allergies: 1)  * Shellfish  Anticoagulation Management History:      The patient is taking warfarin and comes in today for a routine follow up visit.  He is being anticoagulated due to Atrial Fibrillation /flutter.  Positive risk factors for bleeding include presence of serious comorbidities.  Negative risk factors for bleeding include an age less than 62 years old, no history of CVA/TIA, and no history of GI bleeding.  The bleeding index is 'intermediate risk'.  Positive CHADS2 values include History of HTN and History of Diabetes.  Negative CHADS2 values include History of CHF, Age > 62 years old, and Prior Stroke/CVA/TIA.  The start date was 10/06/2009.  Anticoagulation responsible provider: Antoine Poche MD,  Fayrene Fearing.  INR POC: 4.0.  Cuvette Lot#: [id number].    Anticoagulation Management Assessment/Plan:      The patient's current anticoagulation dose is Warfarin sodium 5 mg tabs: Use as directed by Anticoagulation Clinic.  The target INR is 2.0-3.0.  The next INR is due 11/18/2009.  Anticoagulation instructions were given to patient.  Results were reviewed/authorized by Vashti Hey RN.  He was notified by Vashti Hey RN.         Prior Anticoagulation Instructions: INR 2.1 Take coumadin 7.5mg  once daily except 5mg  on Mondays and Fridays  Current Anticoagulation Instructions: INR 4.0   See comments Hold coumadin tonight, Take 2.5mg  Saturday then decrease dose to 5mg  once daily except 7.5mg  on Mondays and Thursdays

## 2010-04-18 NOTE — Assessment & Plan Note (Signed)
Summary: 6 WK FOLLOW UP PER 7/21 OV-JM   Visit Type:  Follow-up Primary Provider:  Dr. [name]   History of Present Illness: 60 year old [name] presents for followup. I saw him back in July. Coumadin was initiated at that time with ultimate plan for elective cardioversion of atrial fibrillation/flutter. He has been followed through our Coumadin clinic, and mentions some recent foot and toe discomfort that he is concerned may be a side effect of Coumadin.  Since last seen, patient complains of recurrent "gout" in both toes, for which he placed himself back on colchicine. Of note, however, he did not seek medical attention for this, and treated himself with medication, which he already had. He is convinced that this is due to the recent addition of Coumadin.  He otherwise reports no significant change in baseline exercise tolerance level; he continues to have mild DOE, with no associated chest pain. He has occasional palpitations. He denies any evidence of overt bleeding.  A 12-lead EKG in our office today indicates persistent atrial fibrillation/flutter at 87 bpm.  Patient has been followed in our Coumadin clinic, and has had therapeutic readings of 4.5, 2.1, and 4.0, as of August 9. His level today is 3.4.  Preventive Screening-Counseling & Management  Alcohol-Tobacco     Smoking Status: never  Current Medications (verified): 1)  Lisinopril 20 Mg Tabs (Lisinopril) .... Take One Tablet By Mouth Daily 2)  Furosemide 40 Mg Tabs (Furosemide) .... Take One Tablet By Mouth Daily As Needed 3)  Metoprolol Tartrate 50 Mg Tabs (Metoprolol Tartrate) .... Take One Tablet By Mouth Twice A Day 4)  Metformin Hcl 500 Mg Tabs (Metformin Hcl) .... Take 1 Tablet By Mouth Two Times A Day 5)  Nitrostat 0.4 Mg Subl (Nitroglycerin) .... Use As Directed 6)  Actos 45 Mg Tabs (Pioglitazone Hcl) .... Take 1 Tablet By Mouth Once A Day 7)  Warfarin Sodium 5 Mg Tabs (Warfarin Sodium) .... Use As Directed By  Anticoagulation Clinic 8)  Colcrys 0.6 Mg Tabs (Colchicine) .... Take 1 Tablet By Mouth Once A Day  Allergies (verified): 1)  * Shellfish  Comments:  Nurse/Medical Assistant: The patient's medications and allergies were verbally reviewed with the patient and were updated in the Medication and Allergy Lists.  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 Complains of bilateral toe pain, similar to prior episodes of gout. Has also developed diarrhea, which he attributes to colchicine. All other systems negative.   Vital Signs:  Patient profile:   60 year old [name] Height:      70 inches Weight:      243 pounds Pulse rate:   66 / minute BP sitting:   142 / 88  (left arm) Cuff size:   large  Vitals Entered By: Carlye Grippe (November 18, 2009 1:40 PM)  Physical Exam  Additional Exam:  GEN: 60 year old [name], overweight, sitting up, in no distress HEENT: NCAT,PERRLA,EOMI NECK: palpable pulses, no bruits; no JVD; no TM LUNGS: CTA bilaterally HEART: irregular  irregular (S1S2); no significant murmurs; no rubs; no gallops ABD: soft, NT; intact BS EXT: 2-3+, bilateral lower extremity edema SKIN: warm, dry MUSC: no obvious deformity NEURO: A/O (x3)     EKG  Procedure date:  11/18/2009  Findings:      atrial fibrillation/flutter at 87 bpm; normal axis; isolated PVCs  Impression & Recommendations:  Problem # 1:  ATRIAL  FLUTTER (ICD-427.32)  Will proceed with recommended DC cardioversion next week, following documentation of successive therapeutic INR levels, over the past 4 weeks. We will schedule a return clinic visit in one month, with repeat EKG. At that time, we will make a determination as to whether or not he is to remain on Coumadin, indefinitely, given that this is his first documented episode of atrial fibrillation/flutter, and with a [place] score of 2. He has expressed a desire to have the procedure scheduled at [facility].  Problem # 2:  GOUT (ICD-274.9)  patient complains of possible recurrent gout, secondary to initiation of Coumadin. He has placed himself back on colchicine, which he has used in the past. He has requested treatment with steroids, and we will provide a prescription for a Medrol Dosepak. However, I informed him that he would need to establish with a primary care physician for further monitoring and management.  Problem # 3:  CAD, NATIVE VESSEL (ICD-414.01)  clinically stable on current medication regimen.  Problem # 4:  EDEMA (ICD-782.3)  the patient has normal LV function.  Other Orders: EKG w/ Interpretation (93000) Cardioversion (Cardioversion)  Patient Instructions: 1)  Medrol pack as directed. 2)  Coumadin check on Tues, Sept 13, 2011 at 3:30pm. 3)  Follow up with Dr. Diona Browner on Thursday, December 22, 2009 at 1:20pm.  4)  Call Dr. Leandrew Koyanagi at [facility] to establish with primary care. [phone] 5)  Your physician has recommended that you have a cardioversion (DCCV).  Electrical cardioversion uses a jolt of electricity to your heart either through paddles or wired patches attached to your chest. This is a controlled, usually prescheduled, procedure. Defibrillation is done under light anesthesia in the hospital, and you usually go home the day of the procedure. This is done to get your heart back into a normal rhythm. You are not awake for the procedure.  Please see the instruction sheet given to you today. Prescriptions: MEDROL (PAK) 4 MG TABS (METHYLPREDNISOLONE) Take as directed.  #1 x 0   Entered by:   Cyril Loosen, RN, BSN   Authorized by:   Nelida Meuse, PA-C   Signed by:   Cyril Loosen, RN, BSN on 11/18/2009   Method used:   [facility]  [address] [address] [id number]       [address]       [place], [place]  [address]       Ph: [phone] or [phone]       Fax: [phone]   RxID:   [id number]  I have reviewed and approved all prescriptions at the time of the office visit. Nelida Meuse, PA-C  November 18, 2009 3:00 PM

## 2010-04-18 NOTE — Medication Information (Signed)
Summary: ccr-lr-POST CARDIOVERSION ON 9/8-JM  Anticoagulant Therapy  Managed by: Vashti Hey RN PCP: Dr. [name] Supervising MD: Diona Browner MD, Remi Deter Indication 1: Atrial Fibrillation Indication 2: Atrial Flutter Lab Used: [facility]  Site: [place] INR POC 3.0  Dietary changes: no    Health status changes: no    Bleeding/hemorrhagic complications: no    Recent/future hospitalizations: no    Any changes in medication regimen? no    Recent/future dental: no  Any missed doses?: no       Is patient compliant with meds? yes       Allergies: 1)  * Shellfish  Anticoagulation Management History:      The patient is taking warfarin and comes in today for a routine follow up visit.  He is being anticoagulated because of Atrial Fibrillation /flutter.  Positive risk factors for bleeding include presence of serious comorbidities.  Negative risk factors for bleeding include an age less than 52 years old, no history of CVA/TIA, and no history of GI bleeding.  The bleeding index is 'intermediate risk'.  Positive CHADS2 values include History of HTN and History of Diabetes.  Negative CHADS2 values include History of CHF, Age > 69 years old, and Prior Stroke/CVA/TIA.  The start date was 10/06/2009.  Anticoagulation responsible provider: Diona Browner MD, Remi Deter.  INR POC: 3.0.  Cuvette Lot#: [id number].    Anticoagulation Management Assessment/Plan:      The patient's current anticoagulation dose is Warfarin sodium 5 mg tabs: Use as directed by Anticoagulation Clinic.  The target INR is 2.0-3.0.  The next INR is due 12/06/2009.  Anticoagulation instructions were given to patient.  Results were reviewed/authorized by Vashti Hey RN.  He was notified by Vashti Hey RN.        Coagulation management information includes: DCCV 11/24/09.  Prior Anticoagulation Instructions: INR 3.4 Continue coumadin 5mg  once daily except 7.5mg  on Mondays and Thursdays  Current Anticoagulation  Instructions: INR 3.0 Take coumadin 5mg  once daily except 7.5mg  on Thursdays

## 2010-04-18 NOTE — Medication Information (Signed)
Summary: ccr-lr  Anticoagulant Therapy  Managed by: Vashti Hey RN PCP: Dr. [name] Supervising MD: Antoine Poche MD, Fayrene Fearing Indication 1: Atrial Fibrillation Indication 2: Atrial Flutter Lab Used: [facility] [place] Site: [place] INR POC 3.4  Dietary changes: no    Health status changes: no    Bleeding/hemorrhagic complications: no    Recent/future hospitalizations: yes       Details: pending DCCV  Any changes in medication regimen? no    Recent/future dental: no  Any missed doses?: no       Is patient compliant with meds? yes       Allergies: 1)  * Shellfish  Anticoagulation Management History:      The patient is taking warfarin and comes in today for a routine follow up visit.  He is being anticoagulated due to Atrial Fibrillation /flutter.  Positive risk factors for bleeding include presence of serious comorbidities.  Negative risk factors for bleeding include an age less than 22 years old, no history of CVA/TIA, and no history of GI bleeding.  The bleeding index is 'intermediate risk'.  Positive CHADS2 values include History of HTN and History of Diabetes.  Negative CHADS2 values include History of CHF, Age > 32 years old, and Prior Stroke/CVA/TIA.  The start date was 10/06/2009.  Anticoagulation responsible provider: Antoine Poche MD, Fayrene Fearing.  INR POC: 3.4.  Cuvette Lot#: [id number].    Anticoagulation Management Assessment/Plan:      The patient's current anticoagulation dose is Warfarin sodium 5 mg tabs: Use as directed by Anticoagulation Clinic.  The target INR is 2.0-3.0.  The next INR is due 11/25/2009.  Anticoagulation instructions were given to patient.  Results were reviewed/authorized by Vashti Hey RN.  He was notified by Vashti Hey RN.         Prior Anticoagulation Instructions: INR 4.0   See comments Hold coumadin tonight, Take 2.5mg  Saturday then decrease dose to 5mg  once daily except 7.5mg  on Mondays and Thursdays  Current Anticoagulation Instructions: INR  3.4 Continue coumadin 5mg  once daily except 7.5mg  on Mondays and Thursdays

## 2010-04-18 NOTE — Medication Information (Signed)
Summary: CCR-LAST CHECK 12/22/09-JM  Anticoagulant Therapy  Managed by: Vashti Hey, RN PCP: Dr. Perrin Maltese MD: Diona Browner MD, Remi Deter Indication 1: Atrial Fibrillation Indication 2: Atrial Flutter Lab Used: [facility] [place] Site: [place] INR POC 2.2  Dietary changes: no    Health status changes: no    Bleeding/hemorrhagic complications: no    Recent/future hospitalizations: no    Any changes in medication regimen? no    Recent/future dental: no  Any missed doses?: no       Is patient compliant with meds? yes       Allergies: 1)  * Shellfish  Anticoagulation Management History:      The patient is taking warfarin and comes in today for a routine follow up visit.  He is being anticoagulated because of Atrial Fibrillation /flutter.  Positive risk factors for bleeding include presence of serious comorbidities.  Negative risk factors for bleeding include an age less than 52 years old, no history of CVA/TIA, and no history of GI bleeding.  The bleeding index is 'intermediate risk'.  Positive CHADS2 values include History of HTN and History of Diabetes.  Negative CHADS2 values include History of CHF, Age > 51 years old, and Prior Stroke/CVA/TIA.  The start date was 10/06/2009.  Anticoagulation responsible provider: Diona Browner MD, Remi Deter.  INR POC: 2.2.  Cuvette Lot#: [id number].    Anticoagulation Management Assessment/Plan:      The patient's current anticoagulation dose is Warfarin sodium 5 mg tabs: Use as directed by Anticoagulation Clinic.  The target INR is 2.0-3.0.  The next INR is due 01/31/2010.  Anticoagulation instructions were given to patient.  Results were reviewed/authorized by Vashti Hey, RN.  He was notified by Vashti Hey RN.         Prior Anticoagulation Instructions: INR 2.4 Pt has been taking 5mg  once daily  Continue 5mg  once daily   Current Anticoagulation Instructions: INR 2.2 Continue coumadin 5mg  once daily

## 2010-04-18 NOTE — Assessment & Plan Note (Signed)
Summary: 6 MO FU PER OCT REMINDER-SRS   Visit Type:  Follow-up Referring Jeremian Whitby:  Dr. Dagoberto Ligas Primary Kynisha Memon:  Dr. Sherril Croon   History of Present Illness: 60 year old male presents for a followup visit. He was last seen back in May of 2010. He reports doing well in general, without any significant angina. His blood sugar has been under better control by his report under the direction of Dr. Dagoberto Ligas. Medications were reviewed and outlined below.  Mr. Meadowcroft has lower extremity edema occasionally, particularly when he does not elevate his legs. P.r.n. Lasix has been very helpful. He has had no orthopnea or PND.  Mr. Jafri has been trying to lose some weight and we discussed exercise some today.  Current Medications (verified): 1)  Lisinopril 20 Mg Tabs (Lisinopril) .... Take One Tablet By Mouth Daily 2)  Furosemide 40 Mg Tabs (Furosemide) .... Take One Tablet By Mouth Daily As Needed 3)  Metoprolol Tartrate 50 Mg Tabs (Metoprolol Tartrate) .... Take One Tablet By Mouth Twice A Day 4)  Ecotrin 325 Mg Tbec (Aspirin) .... Take 1 Tablet By Mouth Once A Day 5)  Metformin Hcl 500 Mg Tabs (Metformin Hcl) .... Take 1 Tablet By Mouth Two Times A Day 6)  Nitrostat 0.4 Mg Subl (Nitroglycerin) .... Use As Directed 7)  Actos 45 Mg Tabs (Pioglitazone Hcl) .... Take 1 Tablet By Mouth Once A Day  Allergies (verified): 1)  * Shellfish  Comments:  Nurse/Medical Assistant: The patient's medications and allergies were reviewed with the patient and were updated in the Medication and Allergy Lists. Verbally gave names and doses.  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 The left ventricular chamber size is normal. Mild concentric left         ventricular hypertrophy is observed. Global left ventricular wall         motion and contractility are within normal limits. The estimated         ejection fraction is 55-60%.          2. The left atrium is mild to moderately dilated.          3. There is mild mitral regurgitation.          4. Mild aortic leaflet calcification is visualized. There is no         evidence of aortic regurgitation. The mean gradient of the aortic         valve is 3 mmHg.          5. The right ventricle is mildly dilated.  The right ventricular         global systolic function is normal.         6. There is mild tricuspid regurgitation. The right ventricular         systolic pressure is calculated at 30 mmHg.          7. There is no pericardial effusion. (08/05/2008)    Review of Systems  The patient denies anorexia, fever, chest pain, syncope, prolonged cough, headaches, melena, and  hematochezia.         Chronic back problems. Otherwise reviewed and negative.  Vital Signs:  Patient profile:   60 year old male Height:      70 inches Weight:      246 pounds BMI:     35.42 Pulse rate:   74 / minute BP sitting:   158 / 88  (left arm) Cuff size:   large  Vitals Entered By: Carlye Grippe (June 17, 2009 2:24 PM)  Nutrition Counseling: Patient's BMI is greater than 25 and therefore counseled on weight management options.  Physical Exam  Additional Exam:  Obese male no acute distress. HEENT: Conjunctiva and lids are normal, oropharynx clear. Neck: Supple, no elevated JVP or bruits. Lungs: Clear auscultation, nonlabored. Cardiac: Regular rate and rhythm, no S3. Abdomen: Obese, nontender, bowel sounds present. Extremities: Venous stasis, distal pulses one  plus.   EKG  Procedure date:  06/17/2009  Findings:      Normal sinus rhythm at 77 beats per minute with nonspecific T-wave changes.  Impression & Recommendations:  Problem # 1:  CAD, NATIVE VESSEL (ICD-414.01)  Symptomatically stable without active angina. Refill given for nitroglycerin to maintain fresh bottle. Electrocardiogram is stable. I asked him to keep an eye on his blood pressure, which is elevated today, although he denies that this has been a typical trend. Discussed weight loss and exercise. I will see him back in 6 months.  His updated medication list for this problem includes:    Lisinopril 20 Mg Tabs (Lisinopril) .Marland Kitchen... Take one tablet by mouth daily    Metoprolol Tartrate 50 Mg Tabs (Metoprolol tartrate) .Marland Kitchen... Take one tablet by mouth twice a day    Ecotrin 325 Mg Tbec (Aspirin) .Marland Kitchen... Take 1 tablet by mouth once a day    Nitrostat 0.4 Mg Subl (Nitroglycerin) ..... Use as directed  Orders: EKG w/ Interpretation (93000)  Problem # 2:  EDEMA (ICD-782.3)  Intermittent, largely dependent, and associated with venous stasis. This has been stable, and well-controlled with p.r.n. Lasix.  Problem # 3:  MIXED HYPERLIPIDEMIA (ICD-272.2)  Followed by Dr. Dagoberto Ligas.  The following medications were removed from the medication list:    Crestor 5 Mg Tabs (Rosuvastatin calcium) .Marland Kitchen... Take 1 tablet by mouth once a day  Patient Instructions: 1)  Your physician wants you to follow-up in: 6 months. You will receive a reminder letter in the mail one-two months in advance. If you don't receive a letter, please call our office to schedule the follow-up appointment. 2)  Your physician recommends that you continue on your current medications as directed. Please refer to the Current Medication list given to you today. Prescriptions: NITROSTAT 0.4 MG SUBL (NITROGLYCERIN) use as directed  #25 Tablet x 3   Entered by:   Cyril Loosen, RN, BSN   Authorized by:   Loreli Slot, MD,  [facility]   Signed by:   Cyril Loosen, RN, BSN on 06/17/2009   Method used:   [facility]  [address] [address] [facility]       [address]       [place], [place]  [address]       Ph: [phone] or [phone]       Fax: [phone]   RxID:   [id number]

## 2010-04-18 NOTE — Letter (Signed)
Summary: Internal Correspondence/ FAXED CARDIOVERSION ORDER  Internal Correspondence/ FAXED CARDIOVERSION ORDER   Imported By: Dorise Hiss 11/29/2009 11:00:42  _____________________________________________________________________  External Attachment:    Type:   Image     Comment:   External Document

## 2010-04-18 NOTE — Consult Note (Signed)
Summary: Consultation Report  Consultation Report   Imported By: Dorise Hiss 10/05/2009 14:29:20  _____________________________________________________________________  External Attachment:    Type:   Image     Comment:   External Document

## 2010-04-18 NOTE — Progress Notes (Signed)
Summary: Pt wants anxiety medication  Phone Note Call from Patient Call back at Home Phone [phone]   Summary of Call: Pt called the office stating he saw Dr. Diona Browner this am. He states he is a "nervous wreck." He asked if Dr. Diona Browner would prescribe him something for his nerves. Pt notified that Dr. Diona Browner generally refers any non-cardiac medications or problems to primary MD. Pt states he does not have a primary MD. Notified pt he needs to try to establish with someone. He states he just hasn't found anyone he likes or that is taking new pts since Dr. Eliberto Ivory died. Pt notified to contact [facility] and try to establish care with Dr. Leandrew Koyanagi. Pt notified that a note will be sent to Dr. Diona Browner to notify him of pt's request for anxiety medication and that pt will be notified of Dr. Ival Bible response.  Initial call taken by: Cyril Loosen, RN, BSN,  October 06, 2009 3:42 PM  Follow-up for Phone Call        Should establish with primary care provider to more completely assess and treat anxiety. Follow-up by: Loreli Slot, MD, [facility],  October 06, 2009 7:30 PM  Additional Follow-up for Phone Call Additional follow up Details #1::        Pt notified and verbalized understanding. Additional Follow-up by: Cyril Loosen, RN, BSN,  October 07, 2009 4:54 PM

## 2010-04-20 NOTE — Medication Information (Signed)
Summary: ccr-lr  Anticoagulant Therapy  Managed by: Vashti Hey, RN PCP: Dr. Perrin Maltese MD: Diona Browner MD, Remi Deter Indication 1: Atrial Fibrillation Indication 2: Atrial Flutter Lab Used: [facility] [place] Site: [place] INR POC 3.3  Dietary changes: no    Health status changes: no    Bleeding/hemorrhagic complications: no    Recent/future hospitalizations: no    Any changes in medication regimen? no    Recent/future dental: no  Any missed doses?: no       Is patient compliant with meds? yes       Allergies: 1)  * Shellfish  Anticoagulation Management History:      The patient is taking warfarin and comes in today for a routine follow up visit.  Warfarin therapy is being given due to Atrial Fibrillation /flutter.  Positive risk factors for bleeding include presence of serious comorbidities.  Negative risk factors for bleeding include an age less than 50 years old, no history of CVA/TIA, and no history of GI bleeding.  The bleeding index is 'intermediate risk'.  Positive CHADS2 values include History of HTN and History of Diabetes.  Negative CHADS2 values include History of CHF, Age > 26 years old, and Prior Stroke/CVA/TIA.  The start date was 10/06/2009.  Anticoagulation responsible provider: Diona Browner MD, Remi Deter.  INR POC: 3.3.  Cuvette Lot#: [id number].    Anticoagulation Management Assessment/Plan:      The patient's current anticoagulation dose is Warfarin sodium 5 mg tabs: Use as directed by Anticoagulation Clinic.  The target INR is 2.0-3.0.  The next INR is due 04/28/2010.  Anticoagulation instructions were given to patient.  Results were reviewed/authorized by Vashti Hey, RN.  He was notified by Vashti Hey RN.         Prior Anticoagulation Instructions: INR 2.5 Continue coumadin 5mg  once daily   Current Anticoagulation Instructions: INR 3.3 Hold coumadin tonight then resume 5mg  once daily

## 2010-04-20 NOTE — Medication Information (Signed)
Summary: ccr-lr  Anticoagulant Therapy  Managed by: Vashti Hey, RN PCP: Dr. Perrin Maltese MD: Diona Browner MD, Remi Deter Indication 1: Atrial Fibrillation Indication 2: Atrial Flutter Lab Used: [facility] [place]: [place] INR POC 2.5  Dietary changes: no    Health status changes: no    Bleeding/hemorrhagic complications: no    Recent/future hospitalizations: no    Any changes in medication regimen? no    Recent/future dental: no  Any missed doses?: no       Is patient compliant with meds? yes       Allergies: 1)  * Shellfish  Anticoagulation Management History:      The patient is taking warfarin and comes in today for a routine follow up visit.  Warfarin therapy is being given due to Atrial Fibrillation /flutter.  Positive risk factors for bleeding include presence of serious comorbidities.  Negative risk factors for bleeding include an age less than 65 years old, no history of CVA/TIA, and no history of GI bleeding.  The bleeding index is 'intermediate risk'.  Positive CHADS2 values include History of HTN and History of Diabetes.  Negative CHADS2 values include History of CHF, Age > 18 years old, and Prior Stroke/CVA/TIA.  The start date was 10/06/2009.  Anticoagulation responsible provider: Diona Browner MD, Remi Deter.  INR POC: 2.5.  Cuvette Lot#: [id number].    Anticoagulation Management Assessment/Plan:      The patient's current anticoagulation dose is Warfarin sodium 5 mg tabs: Use as directed by Anticoagulation Clinic.  The target INR is 2.0-3.0.  The next INR is due 03/28/2010.  Anticoagulation instructions were given to patient.  Results were reviewed/authorized by Vashti Hey, RN.  He was notified by Vashti Hey RN.         Prior Anticoagulation Instructions: INR 1.2 Take coumadin 2 tablets tonight and tomorrow night then resume 1 tablet once daily   Current Anticoagulation Instructions: INR 2.5 Continue coumadin 5mg  once daily

## 2010-04-28 ENCOUNTER — Encounter: Payer: Self-pay | Admitting: Cardiology

## 2010-04-28 ENCOUNTER — Encounter (INDEPENDENT_AMBULATORY_CARE_PROVIDER_SITE_OTHER): Payer: BC Managed Care – PPO

## 2010-04-28 DIAGNOSIS — Z7901 Long term (current) use of anticoagulants: Secondary | ICD-10-CM

## 2010-04-28 DIAGNOSIS — I4891 Unspecified atrial fibrillation: Secondary | ICD-10-CM

## 2010-04-28 LAB — CONVERTED CEMR LAB: POC INR: 1.4

## 2010-05-03 ENCOUNTER — Telehealth (INDEPENDENT_AMBULATORY_CARE_PROVIDER_SITE_OTHER): Payer: Self-pay | Admitting: *Deleted

## 2010-05-05 ENCOUNTER — Encounter (INDEPENDENT_AMBULATORY_CARE_PROVIDER_SITE_OTHER): Payer: Self-pay | Admitting: *Deleted

## 2010-05-05 ENCOUNTER — Encounter: Payer: Self-pay | Admitting: Cardiology

## 2010-05-05 ENCOUNTER — Encounter (INDEPENDENT_AMBULATORY_CARE_PROVIDER_SITE_OTHER): Payer: BC Managed Care – PPO

## 2010-05-05 ENCOUNTER — Ambulatory Visit (INDEPENDENT_AMBULATORY_CARE_PROVIDER_SITE_OTHER): Payer: BC Managed Care – PPO | Admitting: Cardiology

## 2010-05-05 DIAGNOSIS — I2510 Atherosclerotic heart disease of native coronary artery without angina pectoris: Secondary | ICD-10-CM

## 2010-05-05 DIAGNOSIS — I4892 Unspecified atrial flutter: Secondary | ICD-10-CM

## 2010-05-05 DIAGNOSIS — R0989 Other specified symptoms and signs involving the circulatory and respiratory systems: Secondary | ICD-10-CM

## 2010-05-10 NOTE — Assessment & Plan Note (Signed)
Summary: patient request ekg/thinks heart out of rhythm [place]  Nurse Visit  CC: ekg per phone note Comments patient denied dizziness,chest pain,SOB   Preventive Screening-Counseling & Management  Alcohol-Tobacco     Smoking Status: never  Visit Type:  nurse visit  CC:  ekg per phone note.   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

## 2010-05-10 NOTE — Assessment & Plan Note (Addendum)
Summary: per jenni add to see Dr. Myrtis Ser   Visit Type:  Add-on visit Referring Provider:  Dr. Dagoberto Ligas Primary Provider:  Dr. Leandrew Koyanagi  CC:  EKG change.  History of Present Illness: The patient called this morning to say that he had felt that his heart beat might be irregular.  He has been cardioverted in the past.  He came in for an EKG.  They showed sinus rhythm.  However the EKG was later reviewed electronically by Dr. Diona Browner who noted that there were some ST changes since his prior tracing.  We contacted the patient and brought him back for a full evaluation by me today.  He does have known coronary disease post CABG in 2004.  He is not having any chest pain.  He's not had any significant shortness of breath.  His activity level is limited by discomfort that he is having his feet and knees.  Recently warfarin was changed to brand name Coumadin to see if this helped.  He thinks that some of his foot and knee pain are new since the time of his warfarin.  He has been on brand name Coumadin for several days and thinks he may in fact be feeling better.  Preventive Screening-Counseling & Management  Alcohol-Tobacco     Smoking Status: never  Current Medications (verified): 1)  Lisinopril 20 Mg Tabs (Lisinopril) .... Take One Tablet By Mouth Daily 2)  Furosemide 40 Mg Tabs (Furosemide) .... Take One Tablet By Mouth Daily As Needed 3)  Metoprolol Tartrate 50 Mg Tabs (Metoprolol Tartrate) .... Take One Tablet By Mouth Twice A Day 4)  Metformin Hcl 500 Mg Tabs (Metformin Hcl) .... Take 1 Tablet By Mouth Two Times A Day 5)  Nitrostat 0.4 Mg Subl (Nitroglycerin) .... Use As Directed 6)  Actos 45 Mg Tabs (Pioglitazone Hcl) .... Take 1 Tablet By Mouth Once A Day 7)  Warfarin Sodium 5 Mg Tabs (Warfarin Sodium) .... Use As Directed By Anticoagulation Clinic 8)  Colcrys 0.6 Mg Tabs (Colchicine) .... Take 1 Tablet By Mouth Once A Day  Allergies (verified): 1)  * Shellfish  Comments:  Nurse/Medical  Assistant: The patient's medications and allergies were verbally reviewed with the patient and were updated in the Medication and Allergy Lists.  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  Vital Signs:  Patient profile:   60 year old male Height:      70 inches Weight:      236 pounds BMI:     33.98 Pulse rate:   67 / minute BP sitting:   135 / 88  (left arm) Cuff size:   large  Vitals Entered By: Carlye Grippe (May 05, 2010 10:44 AM)  Nutrition Counseling: Patient's BMI is greater than 25 and therefore counseled on weight management options.  Physical Exam  General:  Patient is stable today.  He is overweight. Head:  head is atraumatic. Eyes:  no xanthelasma. Neck:  no jugular venous distention. Chest Wall:  no chest wall tenderness. Lungs:  lungs are clear.  Respiratory effort is not labored. Heart:  cardiac exam reveals S1-S2.  No clicks or significant murmurs. Abdomen:  abdomen soft. Msk:  no musculoskeletal deformities. Extremities:  no peripheral edema. Skin:  no skin rashes. Psych:  patient is oriented to person time and place.  Affect is normal.   Impression & Recommendations:  Problem # 1:  GOUT (ICD-274.9) The patient has discomfort in his toes ankles and knees.  He has recently been switched from warfarin to brand name Coumadin.  He thinks this may be helping.  No further workup today.  Problem # 2:  ESSENTIAL HYPERTENSION, BENIGN (ICD-401.1) Systolic blood pressure is mildly elevated today.  We brought him into the office twice and worried him about  his EKGs.  I am not inclined to change his medicines.  He should have further blood pressure checks over time.  Problem # 3:  ATRIAL FLUTTER (ICD-427.32) EKG is done today.  He has normal sinus rhythm.  There is no definite return of his atrial arrhythmia.  No further workup.  Problem # 4:  EDEMA (ICD-782.3) The patient does not have any significant edema today.  No further workup.  Problem # 5:  CAD, NATIVE VESSEL (ICD-414.01) The patient has known coronary disease post catheter in 2004.  He had an echo in May, 2010.  Ejection fraction at that time was 55-60%.  The EKG done this morning was reviewed by me.  The patient does have sinus rhythm.  There are ST-T wave changes with some T-wave inversions that are new since his last tracing of October, 2011.  He is not having any significant symptoms.  I have chosen to proceed with a 2-D echo to reassess LV function.  I feel he does not need any change in his medicines as of today.  He will followup with Dr. Diona Browner after his echo.  At that time decisions can be made as to whether any further workup is needed.  Other Orders: EKG w/ Interpretation (93000) 2-D Echocardiogram (2D Echo)  Patient Instructions: 1)  Follow up with Dr. Diona Browner on  Wednesday, May 24, 2010 at 1pm. 2)  Your physician has requested that you have an echocardiogram.  Echocardiography is a painless test that uses sound waves to create images of your heart. It provides your doctor with information about the size and shape of your heart and how well your heart's chambers and valves are working.  This procedure takes approximately one hour. There are no restrictions for this procedure. 3)  Your physician recommends that you continue on your current medications as directed. Please refer to the Current Medication list given to you today.

## 2010-05-10 NOTE — Progress Notes (Signed)
Summary: change warfarin to coumadin  Phone Note Outgoing Call   Call placed by: Vashti Hey RN,  May 03, 2010 9:32 AM Call placed to: Patient Summary of Call: Spoke with pt.  Have Brand name coumadin 5mg  samples available for pt to try since he thinks his foot pain is caused by warfarin.  He will pick up samples today.  We will see if side effects improve with med change. Initial call taken by: Vashti Hey RN,  May 03, 2010 9:34 AM

## 2010-05-10 NOTE — Progress Notes (Signed)
Summary: patient feels heart out of rhythm  Phone Note Call from Patient Call back at Home Phone [phone]   Reason for Call: Talk to Nurse Summary of Call: patient feels his heart is out of rhythm.  He requested an appointment with Diona Browner before telling me about wanting and EKG... Initial call taken by: Claudette Laws,  May 03, 2010 12:46 PM  Follow-up for Phone Call        nurse called and informed patient he could come for nurse visit. nurse offered an appt in am and patient said he couldn't come tomorrow and wanted to come Friday. Nurse informed patient to come Friday am @8 :15am. Follow-up by: Carlye Grippe,  May 03, 2010 4:35 PM     Appended Document: patient feels heart out of rhythm Agree that he should be seen and have an ECG. I am not in [place] for the remainder of the week.

## 2010-05-16 ENCOUNTER — Encounter: Payer: Self-pay | Admitting: Cardiology

## 2010-05-16 ENCOUNTER — Encounter (INDEPENDENT_AMBULATORY_CARE_PROVIDER_SITE_OTHER): Payer: BC Managed Care – PPO

## 2010-05-16 DIAGNOSIS — I4892 Unspecified atrial flutter: Secondary | ICD-10-CM

## 2010-05-16 DIAGNOSIS — Z7901 Long term (current) use of anticoagulants: Secondary | ICD-10-CM

## 2010-05-16 DIAGNOSIS — I4891 Unspecified atrial fibrillation: Secondary | ICD-10-CM

## 2010-05-16 LAB — CONVERTED CEMR LAB: POC INR: 3.1

## 2010-05-24 ENCOUNTER — Encounter: Payer: Self-pay | Admitting: Physician Assistant

## 2010-05-24 ENCOUNTER — Encounter (INDEPENDENT_AMBULATORY_CARE_PROVIDER_SITE_OTHER): Payer: Self-pay | Admitting: *Deleted

## 2010-05-24 ENCOUNTER — Ambulatory Visit (INDEPENDENT_AMBULATORY_CARE_PROVIDER_SITE_OTHER): Payer: BC Managed Care – PPO | Admitting: Cardiology

## 2010-05-24 DIAGNOSIS — I4891 Unspecified atrial fibrillation: Secondary | ICD-10-CM

## 2010-05-25 NOTE — Medication Information (Signed)
Summary: ccr-lr  Anticoagulant Therapy  Managed by: Vashti Hey, RN PCP: Dr. Perrin Maltese MD: Diona Browner MD, Remi Deter Indication 1: Atrial Fibrillation Indication 2: Atrial Flutter Lab Used: [facility]  Site: [place] INR POC 3.1  Dietary changes: no    Health status changes: no    Bleeding/hemorrhagic complications: no    Recent/future hospitalizations: no    Any changes in medication regimen? no    Recent/future dental: no  Any missed doses?: no       Is patient compliant with meds? yes       Allergies: 1)  * Shellfish  Anticoagulation Management History:      The patient is taking warfarin and comes in today for a routine follow up visit.  Anticoagulation is being administered due to Atrial Fibrillation /flutter.  Positive risk factors for bleeding include presence of serious comorbidities.  Negative risk factors for bleeding include an age less than 59 years old, no history of CVA/TIA, and no history of GI bleeding.  The bleeding index is 'intermediate risk'.  Positive CHADS2 values include History of HTN and History of Diabetes.  Negative CHADS2 values include History of CHF, Age > 25 years old, and Prior Stroke/CVA/TIA.  The start date was 10/06/2009.  Anticoagulation responsible provider: Diona Browner MD, Remi Deter.  INR POC: 3.1.  Cuvette Lot#: [id number].    Anticoagulation Management Assessment/Plan:      The patient's current anticoagulation dose is Coumadin 5 mg tabs: Take 1 tablet daily.  The target INR is 2.0-3.0.  The next INR is due 06/02/2010.  Anticoagulation instructions were given to patient.  Results were reviewed/authorized by Vashti Hey, RN.  He was notified by Vashti Hey RN.         Prior Anticoagulation Instructions: INR 1.4 Take warfarin 10mg  tonight and tomorrow night then resume 5mg  once daily  Pt still complains of feet pain on warfarin Will bring pt samples of brand name coumadin to try and see if side effects improve  Current  Anticoagulation Instructions: INR 3.1 Pt has been on brand name coumadin about 2 weeks.  Changed due to feet and legs hurting.  States symptoms have improved. Decrease coumadin to 5mg  once daily except 2.5mg  on Tuesdays

## 2010-05-30 NOTE — Letter (Signed)
Summary: Generic Engineer, agricultural at [facility] [address]   [place], [place] [address]   Phone: [phone]  Fax: [phone]        May 24, 2010 MRN: [id number]    [name] [name] [address] [place], [place]  [address]    Dear [name],    Upon further review of your records following your office visit today, Gene Serpe, PA-C would like for you to have lab work to check your cholesterol and liver function.     Please, take the enclosed order to the [facility] at your earliest convenience and have this lab work drawn. Do not eat or drink after midnight.       Sincerely,  Cyril Loosen, RN, BSN  This letter has been electronically signed by your physician.

## 2010-05-30 NOTE — Assessment & Plan Note (Signed)
Summary: 2-3 WK F/U PER 2/17 OV W/DR. KATZ-JM   Visit Type:  Follow-up Primary Provider:  Dr. Leandrew Koyanagi   History of Present Illness: pt presents for follow up of recent 2-D echocardiogram, per Dr. Willa Rough, for review of recent ST changes on EKG.   Patient has history of atrial flutter, and was in NSR at time of visit. He also presented with no complaint of chest pain, but essentially wanted reassurance that he was still maintaining normal sinus rhythm. He is on Coumadin, followed here in our clinic. No medication adjustments were recommended.  A 2-D echo was performed on November 28, yielding normal LVF (EF 55-60%), with no focal wall motion abnormalities, and no significant valvular abnormalities. He returns today reporting no interim development of angina pectoris, DOE, or tachycardia palpitations.    Preventive Screening-Counseling & Management  Alcohol-Tobacco     Smoking Status: never  Current Medications (verified): 1)  Lisinopril 20 Mg Tabs (Lisinopril) .... Take One Tablet By Mouth Daily 2)  Furosemide 40 Mg Tabs (Furosemide) .... Take One Tablet By Mouth Daily As Needed 3)  Metoprolol Tartrate 50 Mg Tabs (Metoprolol Tartrate) .... Take One Tablet By Mouth Twice A Day 4)  Metformin Hcl 500 Mg Tabs (Metformin Hcl) .... Take 1 Tablet By Mouth Two Times A Day 5)  Nitrostat 0.4 Mg Subl (Nitroglycerin) .... Use As Directed 6)  Actos 45 Mg Tabs (Pioglitazone Hcl) .... Take 1 Tablet By Mouth Once A Day 7)  Coumadin 5 Mg Tabs (Warfarin Sodium) .... 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 All other systems negative.   Vital Signs:  Patient profile:   60 year old male Weight:      238.75 pounds Pulse rate:   59 / minute BP sitting:   133 / 83  (right arm) Cuff size:   regular  Vitals Entered By: Hoover Brunette, LPN (May 23, 1608 1:26 PM) Is Patient Diabetic? Yes Comments 2-3 wks f/u on echo   Physical Exam  Additional Exam:  GEN: 60 year old male, obese, no distress HEENT: NCAT,PERRLA,EOMI NECK: palpable pulses, no bruits; no JVD; no TM LUNGS: CTA bilaterally HEART: RRR (S1S2); no significant murmurs; no rubs; no gallops ABD: protuberant; intact BS EXT: 2+ bottle, nonpitting chronic edema, with venous stasis changes SKIN: warm, dry MUSC: no obvious deformity NEURO: A/O (x3)     Impression & Recommendations:  Problem # 1:  * NEW ST-T WAVE CHANGES FEBRUARY, 2012  no further workup indicated. recent echocardiogram indicates continued preserved LVF, with no interim development of occult wall motion changes. patient also presents with no symptoms suggestive of UAP. he is CABG in 2004, complicated by postop atrial fibrillation/flutter. Recent EKG, reviewed by Dr. Myrtis Ser, indicated continued maintenance of NSR. We'll schedule return visit with Dr. Diona Browner in 6 months.  Problem # 2:  CAD, NATIVE VESSEL (ICD-414.01)  continue current medication regimen.  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 On chronic Coumadin, followed in our clinic.  Problem # 4:  MIXED HYPERLIPIDEMIA (  ICD-272.2)  currently not on a cholesterol-lowering agent. We'll reassess with FLP/LFTs profile.  Patient Instructions: 1)  Your physician wants you to follow-up in: 6 months. You will receive a reminder letter in the mail one-two months in advance. If you don't receive a letter, please call our office to schedule the  follow-up appointment. 2)  Your physician recommends that you continue on your current medications as directed. Please refer to the Current Medication list given to you today.  Appended Document: Orders Update    Clinical Lists Changes  Orders: Added new Test order of T-Lipid Profile [phone]) - Signed Added new Test order of T-Hepatic Function [phone]) - Signed

## 2010-06-01 LAB — COMPREHENSIVE METABOLIC PANEL
ALT: 17 U/L (ref 0–53)
AST: 28 U/L (ref 0–37)
Albumin: 3.8 g/dL (ref 3.5–5.2)
CO2: 28 mEq/L (ref 19–32)
Calcium: 9.1 mg/dL (ref 8.4–10.5)
GFR calc Af Amer: >60 mL/min (ref >60)
Sodium: 136 mEq/L (ref 135–145)
Total Protein: 6.7 g/dL (ref 6.0–8.3)

## 2010-06-01 LAB — CBC
Hemoglobin: 15.6 g/dL (ref 13.0–17.0)
MCHC: 33 g/dL (ref 30.0–36.0)
Platelets: 180 10*3/uL (ref 150–400)
RDW: 14.9 % (ref 11.5–15.5)

## 2010-06-01 LAB — GLUCOSE, CAPILLARY: Glucose-Capillary: 95 mg/dL (ref 70–99)

## 2010-06-04 LAB — URINALYSIS, ROUTINE W REFLEX MICROSCOPIC
Bilirubin Urine: NEGATIVE
Glucose, UA: NEGATIVE mg/dL
Ketones, ur: NEGATIVE mg/dL
Nitrite: NEGATIVE
Specific Gravity, Urine: 1.027 (ref 1.005–1.030)
pH: 5 (ref 5.0–8.0)

## 2010-06-04 LAB — CBC
MCH: 34.5 pg — ABNORMAL HIGH (ref 26.0–34.0)
MCHC: 33.9 g/dL (ref 30.0–36.0)
MCV: 101.8 fL — ABNORMAL HIGH (ref 78.0–100.0)
Platelets: 153 10*3/uL (ref 150–400)
RBC: 4.31 MIL/uL (ref 4.22–5.81)
RDW: 13.8 % (ref 11.5–15.5)

## 2010-06-04 LAB — GLUCOSE, CAPILLARY
Glucose-Capillary: 109 mg/dL — ABNORMAL HIGH (ref 70–99)
Glucose-Capillary: 109 mg/dL — ABNORMAL HIGH (ref 70–99)
Glucose-Capillary: 131 mg/dL — ABNORMAL HIGH (ref 70–99)

## 2010-06-04 LAB — DIFFERENTIAL
Basophils Absolute: 0 10*3/uL (ref 0.0–0.1)
Eosinophils Relative: 2 % (ref 0–5)
Lymphocytes Relative: 18 % (ref 12–46)
Lymphs Abs: 1.3 10*3/uL (ref 0.7–4.0)
Monocytes Absolute: 0.7 10*3/uL (ref 0.1–1.0)
Neutro Abs: 5.4 10*3/uL (ref 1.7–7.7)

## 2010-06-04 LAB — SURGICAL PCR SCREEN: MRSA, PCR: NEGATIVE

## 2010-06-04 LAB — COMPREHENSIVE METABOLIC PANEL
AST: 37 U/L (ref 0–37)
Albumin: 3.8 g/dL (ref 3.5–5.2)
BUN: 15 mg/dL (ref 6–23)
Calcium: 9.4 mg/dL (ref 8.4–10.5)
Creatinine, Ser: 1.06 mg/dL (ref 0.4–1.5)
GFR calc Af Amer: >60 mL/min (ref >60)
Total Protein: 6.8 g/dL (ref 6.0–8.3)

## 2010-06-07 ENCOUNTER — Ambulatory Visit (INDEPENDENT_AMBULATORY_CARE_PROVIDER_SITE_OTHER): Payer: BC Managed Care – PPO | Admitting: *Deleted

## 2010-06-07 DIAGNOSIS — I4892 Unspecified atrial flutter: Secondary | ICD-10-CM

## 2010-06-07 DIAGNOSIS — Z7901 Long term (current) use of anticoagulants: Secondary | ICD-10-CM

## 2010-06-07 LAB — POCT INR: INR: 3

## 2010-06-15 ENCOUNTER — Ambulatory Visit: Payer: BC Managed Care – PPO | Admitting: Cardiology

## 2010-06-28 LAB — POCT I-STAT, CHEM 8
BUN: 28 mg/dL — ABNORMAL HIGH (ref 6–23)
Calcium, Ion: 1.04 mmol/L — ABNORMAL LOW (ref 1.12–1.32)
Chloride: 98 mEq/L (ref 96–112)
Creatinine, Ser: 1.2 mg/dL (ref 0.4–1.5)
Glucose, Bld: 112 mg/dL — ABNORMAL HIGH (ref 70–99)
HCT: 51 % (ref 39.0–52.0)
Hemoglobin: 17.3 g/dL — ABNORMAL HIGH (ref 13.0–17.0)
Potassium: 3.8 meq/L (ref 3.5–5.1)
Sodium: 134 meq/L — ABNORMAL LOW (ref 135–145)
TCO2: 32 mmol/L (ref 0–100)

## 2010-06-28 LAB — CBC
HCT: 46.8 % (ref 39.0–52.0)
Hemoglobin: 16.2 g/dL (ref 13.0–17.0)
RBC: 4.86 MIL/uL (ref 4.22–5.81)
WBC: 7.9 10*3/uL (ref 4.0–10.5)

## 2010-06-28 LAB — POCT CARDIAC MARKERS
CKMB, poc: <1 ng/mL — ABNORMAL LOW (ref 1.0–8.0)
Myoglobin, poc: 73 ng/mL (ref 12–200)
Troponin i, poc: <0.05 ng/mL (ref 0.00–0.09)

## 2010-06-28 LAB — DIFFERENTIAL
Basophils Absolute: 0 10*3/uL (ref 0.0–0.1)
Eosinophils Relative: 1 % (ref 0–5)
Lymphocytes Relative: 22 % (ref 12–46)
Lymphs Abs: 1.8 10*3/uL (ref 0.7–4.0)
Monocytes Absolute: 0.6 10*3/uL (ref 0.1–1.0)
Monocytes Relative: 8 % (ref 3–12)
Neutro Abs: 5.4 10*3/uL (ref 1.7–7.7)

## 2010-06-28 LAB — BRAIN NATRIURETIC PEPTIDE: Pro B Natriuretic peptide (BNP): 91 pg/mL (ref 0.0–100.0)

## 2010-07-04 ENCOUNTER — Encounter: Payer: BC Managed Care – PPO | Admitting: *Deleted

## 2010-07-06 ENCOUNTER — Ambulatory Visit (INDEPENDENT_AMBULATORY_CARE_PROVIDER_SITE_OTHER): Payer: BC Managed Care – PPO | Admitting: *Deleted

## 2010-07-06 DIAGNOSIS — I4892 Unspecified atrial flutter: Secondary | ICD-10-CM

## 2010-07-06 DIAGNOSIS — Z7901 Long term (current) use of anticoagulants: Secondary | ICD-10-CM

## 2010-07-06 LAB — POCT INR: INR: 3.7

## 2010-07-27 ENCOUNTER — Ambulatory Visit (INDEPENDENT_AMBULATORY_CARE_PROVIDER_SITE_OTHER): Payer: BC Managed Care – PPO | Admitting: *Deleted

## 2010-07-27 DIAGNOSIS — Z7901 Long term (current) use of anticoagulants: Secondary | ICD-10-CM

## 2010-07-27 DIAGNOSIS — I4892 Unspecified atrial flutter: Secondary | ICD-10-CM

## 2010-07-27 LAB — POCT INR: INR: 2.2

## 2010-08-01 NOTE — Assessment & Plan Note (Signed)
[facility]                          [facility] OFFICE NOTE   NAME:[name] [name], [name] [name]                       MRN:          [id number]  DATE:01/09/2007                            DOB:          [birth year]    PRIMARY CARE PHYSICIAN:  Dr. [name]   REASON FOR VISIT:  Followup, cardiac catheterization.   HISTORY OF PRESENT ILLNESS:  [name] [name] was referred for a cardiac  catheterization back in late September.  He has a previous history of  coronary artery bypass grafting in 2004.  He was noted to have severe  native two-vessel coronary artery disease with a patent LIMA to the left  anterior descending, patent saphenous vein graft to the first diagonal  branch of the left anterior descending and second obtuse marginal branch  of the left circumflex, patent saphenous vein graft to the third  diagonal branch, left anterior descending, and patent saphenous vein  graft to the left posterior descending branch.  Left ventricular  systolic function was normal.  He was placed on a proton pump inhibitor  and actually reports an improvement in his prior chest discomfort, which  likely represents reflux.  He was very reassured by the cardiac  catheterization findings and we have discussed lifestyle modification,  including weight-loss and exercise.  He is due to see Dr. Sherril Croon next  month for a complete physical.   He has a goal of losing approximately 20 pounds and I have asked him to  do this in reasonable fashion, perhaps ten pounds a month.  His  electrocardiogram today shows sinus rhythm with nonspecific STT-wave  changes.   ALLERGIES:  SHELLFISH.   PRESENT MEDICATIONS:  1. Enteric coated aspirin 325 mg p.o. daily.  2. Zocor 40 mg p.o. daily.  3. Lopressor 50 mg p.o. b.i.d.  4. Lisinopril 10 mg p.o. daily.  5. Metformin 500 mg p.o. b.i.d.  6. Protonix 40 mg p.o. daily.  7. Sublingual nitroglycerin 0.4 mg p.r.n.   REVIEW OF SYSTEMS:  As described in  History of Present Illness.   EXAMINATION:  Blood pressure 152/92, heart rate is 82, weight is 223  pounds.  He is an overweight male in no acute distress.  Examination of the neck reveals no elevated jugular venous pressure.  LUNGS:  Clear.  CARDIAC EXAM:  Reveals a regular rate and rhythm without murmur or  gallop.  EXTREMITIES:  Show no pitting edema.   IMPRESSION/RECOMMENDATIONS:  1. Severe native coronary artery disease with patent bypass grafts,      documented at recent angiography.  Ejection fraction is normal and      the patient is improved symptomatically on proton pump inhibitor,      suggesting reflux disease as an etiology.  I have recommended that      he continue to follow up with Dr. Sherril Croon and we will see him back in      one year's time for symptom review.  I agree with the plan for      weight-loss and more activity.  Would aim for better blood pressure  control.  He may need further up-titration of lisinopril.  Would      also be aggressive with lipids, aiming for LDL of around 70.  2. Further plans to follow.     Jonelle Sidle, MD  Electronically Signed    SGM/MedQ  DD: 01/09/2007  DT: 01/10/2007  Job #: [id number]   cc:   [name]

## 2010-08-01 NOTE — Assessment & Plan Note (Signed)
[facility]                          [facility] OFFICE NOTE   NAME:Delpino, TARVARES LANT                       MRN:          [id number]  DATE:07/30/2008                            DOB:          1950-07-16    PRIMARY CARE PHYSICIAN:  None.   REASON FOR PRESENTATION:  Evaluate the patient with lower extremity  swelling and known coronary disease.   HISTORY OF PRESENT ILLNESS:  The patient is a pleasant 60 year old  gentleman with a history of coronary disease, status post CABG in 2004.  He has not seen a cardiologist in a while.  He does see Dr. Dagoberto Ligas for  management of his dyslipidemia and diabetes.  He has had his meds  adjusted for management of his dyslipidemia recently.  He did see  somebody about his back recently and was noted to have some significant  lower extremity swelling.  Therefore, he was referred to [place].  The  patient is limited by chronic back pain.  He actually has to sleep with  his feet down because of this.  He has noticed, over the last several  days, increased lower extremity swelling.  This is a new finding.  He  has not had a dramatic weight gain though he has had a slow and gradual  weight gain overtime.  He has not had chest pressure, neck or arm  discomfort.  He has not had any palpitation, presyncope or syncope.  He  says he has no shortness of breath and denies any PND or orthopnea.  He  was able to push a manual lawn mower and rake his yard without problems.  He has not had any pain in his legs.  He has had no calf tenderness.  He  has had no long trips or immobility.  He has had no recent  cardiovascular testing.   PAST MEDICAL HISTORY:  Diabetes mellitus since 2004, hypertension since  2004, hyperlipidemia, obesity, coronary artery disease (left main  normal; LAD, severe, diffuse disease with up to 90% stenosis to the mid  vessel; 80% lesions in diagonals; the circumflex 30% proximal stenosis;  40% PDA stenosis.  The  RCA was nondominant); CABG (LIMA to the LAD,  saphenous vein graft to the first diagonal, saphenous vein graft to  obtuse marginal, saphenous vein graft to third diagonal, saphenous vein  graft to PDA).   ALLERGIES/INTOLERANCES:  SHELLFISH caused swelling and nausea.   MEDICATIONS:  Aspirin 325 mg daily, Lopressor 50 mg b.i.d., lisinopril  10 mg daily, metformin 500 mg b.i.d., Crestor 5 mg daily.   SOCIAL HISTORY:  The patient lives in [place].  He is retired.  He quit  smoking years ago.  He does drink alcohol occasionally.   FAMILY HISTORY:  Noncontributory for early coronary artery disease.   REVIEW OF SYSTEMS:  As stated in the HPI and negative for all other  systems.   PHYSICAL EXAMINATION:  GENERAL:  The patient is pleasant and in no  distress.  VITAL SIGNS:  Blood pressure 190/112, heart rate 68 and regular, 97%  saturation on room air, weight 239 pounds.  HEENT:  Eyes unremarkable; pupils equal, round, and reactive to light;  fundi not visualized; oral mucosa unremarkable.  NECK:  No jugular venous distention at 45 degrees, carotid upstroke  brisk and symmetrical, no bruits, no thyromegaly.  LYMPHATICS:  No cervical, axillary, or inguinal adenopathy.  LUNGS:  Clear to auscultation bilaterally.  BACK:  No costovertebral angle tenderness.  CHEST:  Well-healed sternotomy scar.  HEART:  PMI not displaced or sustained, S1 and S2 within normal limits,  no S3, no S4, no clicks, no rubs, no murmurs.  ABDOMEN:  Morbidly obese, positive bowel sounds, normal in frequency and  pitch, no bruits, no rebound, no guarding, no midline pulsatile mass, no  hepatomegaly, no splenomegaly.  SKIN:  No rashes, no nodules.  EXTREMITIES:  2+ upper pulses, diminished posterior tibialis and  dorsalis pedis bilaterally, moderate to severe bilateral lower extremity  edema to mid calf.  NEUROLOGIC:  Oriented to person, place, and time; cranial nerves II  through XII grossly intact; motor grossly  intact.   EKG; sinus rhythm, rate 80, axis within normal limits, intervals within  normal limits, no acute ST-T wave changes.   ASSESSMENT AND PLAN:  1. Lower extremity edema.  The patient has some significant lower      extremity edema.  I do not appreciate evidence of left or right-      sided heart failure, however.  This may be venous incompetence.  I      am to going to start with Lasix 40 mg daily and potassium 20 mEq      daily.  He had some recent labs but Dr. Jerelene Redden office was closed      and so I could not get these.  I am going to bring him back on      Monday for BMET.  He cannot keep his feet elevated because of his      back pain.  We might able to get him to wear compression stockings      if he has continued swelling.  I am going to check an      echocardiogram to evaluate his pulmonary pressures, RV and LV      function.  2. Hypertension.  Blood pressure was significantly elevated, however,      came down to 154/94 after 0.1 of clonidine.  I increased his      lisinopril to 20 mg daily.  He will almost definitely need some      addition to this as well.  This could be checked when he comes back      on Monday and meds changed as needed.  3. Back pain.  He is probably going to need some procedure for this.      He would need stress perfusion imaging prior to this.  I have asked      him to not take Celebrex, which he just started until I can check      his renal function.  He is only been on this for a few days, and I      do not think this is contributing to any volume retention.  4. Diabetes per Dr. Dagoberto Ligas.  I would like to get the recent labs.  5. Dyslipidemia.  Again, this is followed by Dr. Dagoberto Ligas and meds have      recently been adjusted.  The goal should be an LDL less than 70 and      HDL greater than 40.  6. Other.  I would have a low threshold for sleep study in this      patient with the appropriate      body habitus and hypertension.  We will discuss  this in the future      (greater than 45 minutes for this appointment).     Rollene Rotunda, MD, [facility]  Electronically Signed    JH/MedQ  DD: 07/30/2008  DT: 07/31/2008  Job #: [id number]

## 2010-08-01 NOTE — H&P (Signed)
NAMEKERMITT, HARJO NO.:  [id number]   MEDICAL RECORD NO.:  [id number]          PATIENT TYPE:  INP   LOCATION:  6527                         FACILITY:  [facility]   PHYSICIAN:  Unice Cobble, MD     DATE OF BIRTH:  1950/12/21   DATE OF ADMISSION:  12/16/2006  DATE OF DISCHARGE:                              HISTORY & PHYSICAL   PRIMARY CARE PHYSICIAN:  Dr. Sherryll Burger.   CHIEF COMPLAINT:  Chest pain.   HISTORY OF PRESENT ILLNESS:  This is a 60 year old white male with a  history of coronary artery disease, status post CABG, who presents with  a 10-day history of chest pain.  He describes the chest pain as  tightness at rest.  He can walk leisurely for about 30 minutes 3 to 4  times a week.  The chest pain started 3 to 4 weeks ago, but he has no  symptoms with walking.  He never really exerts himself above and beyond  that.  The chest pain is intermittent and 5/10 at worst.  It sometimes  starts after meals and has never woken him from sleep.  He has tried  Alka-Seltzer a few times which has helped briefly.  Each episode lasts  for approximately 30 to 40 minutes and is not associated with shortness  of breath, nausea, vomiting, or diaphoresis.  However, today the reason  he came to the emergency room is that he did have some diaphoresis with  his pain.   PAST MEDICAL HISTORY:  1. The patient is status post CABG after a non-ST elevation MI in      2004.  He thereafter had LIMA to LAD, saphenous venous graft to D1      and OM1, saphenous venous graft to D3, and saphenous venous graft      to the PDA.  2. Diabetes.  3. Hypertension.  4. Hyperlipidemia.   ALLERGIES:  1. HE HAS A SHELLFISH ALLERGY AND HAS BEEN PREMEDICATED BEFORE PRIOR      CATHETERIZATION.   MEDICATIONS:  1. Glucophage.  2. Lopressor.  3. Lisinopril.  4. Aspirin 325 mg.   The patient does not know his dosages.   SOCIAL HISTORY:  He lives in [place] alone.  He is retired.  He has 4-  pack/years and  quit 30 years ago.  He drinks 1/5 of alcohol per week.  No drugs.   FAMILY HISTORY:  His mother is alive at the age of 3.  She does not  have coronary artery disease.  His father is also 30 and does not have  coronary artery disease.  There is no evidence of coronary artery  disease in his family.   REVIEW OF SYSTEMS:  He has had weight gain from eating too much, but  otherwise a complete review of systems is done and found to be negative  except as stated in the HPI.   PHYSICAL EXAMINATION:  VITAL SIGNS:  Temperature is 98, with a pulse of  85, respiratory rate is 20, blood pressure is 159/92, with a O2 sat of  97% on room  air.  GENERAL:  This is an obese white male in no acute distress.  HEENT:  Shows PERRLA.  EOMI.  MMM.  NECK:  Supple without lymphadenopathy, thyromegaly, or jugular venous  distention.  He has bilateral bruits.  No evidence of lymphadenopathy.  HEART:  Has a regular rate and rhythm with a normal S1 and S2.  There is  no significant murmur, rub, or gallop.  His pulses are 2+ and equal  bilaterally in his distal extremities.  LUNGS:  Clear to auscultation without wheezes, rales, or rhonchi.  SKIN:  No skin rashes are noticed.  ABDOMEN:  Soft, nontender with normal bowel sounds.  EXTREMITIES:  Show no clubbing, cyanosis, or edema.  MUSCULOSKELETAL:  No joint deformity or effusions.  NEUROLOGICALLY:  He is alert and oriented x3.  Cranial nerves II-XII  intact.   Chest x-ray no acute disease.   EKG:  He has a rate of 90 and normal sinus rhythm.  There are inferior  lateral T wave inversions, but no ST changes.  No evidence of prior  infarct.   LABORATORY DATA:  Show a normal white count and hemoglobin.  Creatinine  1, potassium 5.  His point of care cardiac enzymes are negative x1.   ASSESSMENT/PLAN:  This is a 60 year old white male with a history of  diabetes, hypertension, and hyperlipidemia, as well as 4-vessel coronary  artery bypass graft in 2004 who  has not seen a cardiologist for 2 years,  who presents with atypical chest pain for 10 days.  The differential  diagnosis includes gastroesophageal reflux disease versus angina.  His  EKG is changed from prior EKG 2 years ago, however, his enzymes are  negative.  He will be admitted tonight for rule out of myocardial  infarction with serial enzymes and EKGs.  Will plan for a possible  catheterization in the a.m. based on his symptoms in the setting of  diabetes being nonspecific and EKG changes.  In the interim, he will be  given aggressive gastroesophageal reflux disease therapy with a GI  cocktail and Protonix.  Insulin sliding scale will be given while  holding Glucophage.  I will place him on his home medication doses that  were present 2 years prior, and the actual doses can be obtained from  his pharmacy in the morning.  He will be premedicated for shellfish  allergy.      Unice Cobble, MD  Electronically Signed     ACJ/MEDQ  D:  12/16/2006  T:  12/17/2006  Job:  [id number]

## 2010-08-01 NOTE — Discharge Summary (Signed)
NAME:  [name] [name], [name] [name] NO.:  [id number]   MEDICAL RECORD NO.:  [id number]          PATIENT TYPE:  INP   LOCATION:  6527                         FACILITY:  [facility]   PHYSICIAN:  Jonelle Sidle, MD DATE OF BIRTH:  1951/02/23   DATE OF ADMISSION:  12/16/2006  DATE OF DISCHARGE:  12/18/2006                         DISCHARGE SUMMARY - REFERRING   BRIEF HISTORY:  [name] [name] is a 60 year old white male who presented  with chest tightness occurring at rest.  He states that the discomfort  started approximately 3-4 weeks ago. He denies exertional symptoms as he  could walk leisurely for 30 minutes three to four times a week without  difficulty.  He describes the discomfort as intermittent at 5 on a scale  of zero to 10 at worst. Sometimes it begins after meals and has never  awoken him up from sleep. Alka-Seltzer has helped briefly. Each episode  lasts less than 40 minutes and is not associated with shortness of  breath, nausea, vomiting or diaphoresis.  However, with the episode on  presentation, he did have some diaphoresis.   PAST MEDICAL HISTORY:  Is notable for:  1. Non-ST-elevated myocardial infarction.  2. Post bypass surgery in 2004.  He had a LIMA to the LAD, saphenous      vein graft to the diagonal-1 and OM-1, saphenous vein graft to the      diagonal-3 and saphenous vein graft to PDA.  3. He also has a history of diabetes.  4. Hypertension.  5. Hyperlipidemia.  6. SHELLFISH allergy   LABORATORY DATA:  Hemoglobin 15.6, hematocrit 45.9, normal indices,  platelets 218, WBC 10.5.  Prior to discharge, hemoglobin was 13.8,  hematocrit 40.3, normal indices, platelets 208, WBC 7.6.  PTT 31, PT  12.5. Admission sodium 137, potassium 4.2, BUN 13, creatinine 0.88.  LFTs showed AST and ALT slightly elevated 41 at 67, respectively.  Otherwise within normal limits.  Magnesium on September 30 was 2.0.  Prior to discharge on the 10th sodium was 137, potassium 4.3, BUN  14,  creatinine 1.15, glucose 205.  CK-MBs, relative indexes, and troponins  were within normal limits.  Fasting lipids showed a total cholesterol  195, triglycerides 290, HDL 53, LDL 84.    Chest x-ray on September 28 showed no active disease.   Chest x-ray on September 29 showed no active disease.   EKG showed baseline artifact, normal sinus rhythm, normal axis, early R  wave, nonspecific ST-T wave changes, and some inferolateral T-wave  inversion.   HOSPITAL COURSE:  [name] [name] was admitted to [facility].  Overnight he continued to have chest discomfort.  However, ruled out for  myocardial infarction.  EKG did show some new inferolateral T-wave  changes.  Given these findings, it was felt that he should undergo  cardiac catheterization.  Dr. Diona Browner reviewed and agreed with the  plan, although he had not seen the patient since 2006. Dr. Diona Browner also  felt that the discomfort might be related to GI issues. He felt that if  his grafts were intact, then a proton pump inhibitor should be  added.  Carotid Dopplers were completed and did not show any ICA stenosis with  bilateral vertebral antegrade flow.  Catheterization was performed on  November 20, 2006, by Dr. Excell Seltzer, and this showed native two-vessel  coronary artery disease in the LAD and circumflex and left main. The RCA  was small and nondominant. The saphenous vein grafts were patent.  However, he did have a 50% lesion at the proximal saphenous vein graft  to the left PDA. Dr. Excell Seltzer recommended medical treatment.  LV function  was normal. On December 18, 2006, post sheath removal, bed rest and  ambulation, catheterization site was stable. After review, Dr. Diona Browner  felt that the patient could be discharged home. With his hyperlipidemia  and slightly elevated LDL, he will address statin  in the office.   DISCHARGE DIAGNOSIS:  1. Chest discomfort of uncertain etiology. It does not appear to be      cardiac in  etiology given catheterization results.  2. Abnormal electrocardiogram.  3. Hypertension.  4. Hyperlipidemia.  5. History of bypass surgery with slight progression of coronary      artery disease based on catheterization.   PROCEDURES PERFORMED:  Cardiac catheterization on September 30,2008, by  Dr. Excell Seltzer.  Please list discharge diagnoses and procedures first   DISPOSITION:  The patient is discharged home.  He is asked to maintain a  low-sodium, heart-healthy ADA diet.  Activities and wound care dictated  per supplemental discharge sheet. He was asked to resume:  1. Glucophage 500 mg b.i.d.  on Thursday morning.  2. He is asked to continue Zocor 40 mg nightly.  3. Aspirin 325 mg daily.  4. Lopressor, dose unknown.  5. Lisinopril, dose unknown.  6. He received new prescription for nitroglycerin 0.4 as needed.  7. Protonix 40 mg p.o. daily.   He is asked to bring all medications to all appointments.  He will  follow up with Dr. Diona Browner in the [facility] office on his first available  appointment, January 09, 2007, at 11:5. At time of followup,  Dr.  Diona Browner will consider increasing his statin.   Discharge time:  25 minutes.      Joellyn Rued, PA-C      Jonelle Sidle, MD  Electronically Signed    EW/MEDQ  D:  12/18/2006  T:  12/18/2006  Job:  [id number]   cc:   Dr. Clelia Croft

## 2010-08-01 NOTE — Assessment & Plan Note (Signed)
[facility]                          [facility]ICE NOTE   NAME:[name] [name] BLANSETT                       MRN:          [id number]  DATE:08/02/2008                            DOB:          Feb 12, 1951    REFERRING PHYSICIAN:  [name], MD   HISTORY OF PRESENT ILLNESS:  The patient is a 60 year old male patient  of Dr. Diona Browner with a history of coronary artery disease, status post  coronary artery bypass grafting.  The patient has a normal ejection  fraction, was seen in clinic by Dr. Antoine Poche on Jul 30, 2008.  The  patient presented then with edema of the feet and legs for approximately  1 week.  The patient was also markedly hypertensive here at the office  visit with a blood pressure of 175/100.  However, of note that when we  were looking back at the records previously, the patient has also had a  blood pressure of 150/92 in 2008.  Dr. Antoine Poche suspect the patient  developed lower extremity edema secondary to heart failure and I gave  him Lasix 40 mg a day and increased his lisinopril to 20 mg a day.  We  also scheduled an echocardiogram.  We also asked the patient to wear  compression stockings.  The patient was seen by the pharmacist this  morning for compression stocking.  She called to our office because she  was concerned that the patient did not wear the stockings.  Indeed the  patient had swelling and erythema of the lower extremities and his legs  are tender to the touch bilaterally.  I told the pharmacist to send the  patient to the office and we would take a look at his legs.  Clinically,  it appears that he has superficial thrombophlebitis.  It does not appear  that he has edema secondary to congestive heart failure.  We will  continue the patient's medical therapy that Dr. Antoine Poche instituted then  we will await for the echo result.  He is to schedule a followup  appointment with Dr. Diona Browner at the end of the week.   In  reviewing the patient's history, he states that he has had  significant back pain to the point where he cannot lay in bed and he has  been laying in a recliner chair with his back propped up and his legs  hanging over a recliner chair, it has been going on for 6 weeks.  The  patient clinically also has superficial varicosities and likely  secondary to the hanging position of his legs and his underlying  varicosities, he has developed superficial thrombophlebitis.  Also, of  note is that the patient has been applying cold compresses to his feet,  which likely contributes to increase the pain sensation.  I told the  patient that I am clinically certain that he has superficial  thrombophlebitis, but we should do lower extremity Dopplers to rule out  venous thrombosis.  The latter is probably unlikely, but could change  our therapy.  In the interim, I recommended heat and elevation of his  legs.  I told him not to wear the compression stockings.  We will give  the patient a prescription of diclofenac 50 mg p.o. t.i.d., if his lower  extremity Dopplers are negative.  If positive, of course, we will take a  different course of action.  I also looked into the possibility of  heparin gel, which is available in [place], but unfortunately not  available in the [place].  Also, I think, systemic therapy with diclofenac is  better than diclofenac gel at this point in time.  We will have Dr.  Diona Browner reevaluate the patient next Friday.  He also needs particular  close attention to his blood pressure, but again I do not think the  patient has clinical heart failure or diastolic dysfunction for that  matter.  He could have obstructive sleep apnea, although he  doubts this.  Again, it does not appear to me that he has right heart  failure, but rather superficial thrombophlebitis and we will treat as  such.     Learta Codding, MD,FACC  Electronically Signed    GED/MedQ  DD: 08/02/2008  DT: 08/03/2008  Job  #: [id number]   cc:   [name], MD

## 2010-08-01 NOTE — Cardiovascular Report (Signed)
NAMECOREYON, NICOTRA NO.:  [id number]   MEDICAL RECORD NO.:  [id number]          PATIENT TYPE:  INP   LOCATION:  6527                         FACILITY:  [facility]   PHYSICIAN:  Veverly Fells. Excell Seltzer, MD  DATE OF BIRTH:  1951/03/03   DATE OF PROCEDURE:  12/17/2006  DATE OF DISCHARGE:                            CARDIAC CATHETERIZATION   PROCEDURE:  1. Left heart catheterization.  2. Selective coronary angiography.  3. Left ventricular angiography.  4. LIMA angiography.  5. Saphenous vein graft angiography.  6. StarClose of the right femoral artery.   INDICATIONS:  [name] [name] is a 60 year old gentleman with known CAD.  He  has diabetes and prior coronary bypass surgery in 2004.  He had  multivessel bypass for severe native CAD.  He has had increasing chest  pain over the last 2 weeks, and was sent for cardiac catheterization.   The risks and indications of the procedure were reviewed with the  patient.  Informed consent was obtained.  The right groin was prepped,  draped, and anesthetized with 1% lidocaine.  A 6-French sheath was  placed in the right femoral artery using the modified Seldinger  technique.  Standard 6-French Judkins catheters were used for native  coronary angiography.  The JR-4 catheter was then used for saphenous  vein graft angiography, I attempted to image the LIMA with a JR-4, but  could not selectively engage the left subclavian artery.   A multipurpose catheter was used to image the saphenous vein graft to  the PDA, and a LIMA catheter was used to selectively image the left  internal mammary artery.  Following native and graft angiography, a  pigtail catheter was inserted into the left ventricle where pressures  were recorded.  A left ventriculogram was performed.  A pullback across  the aortic valve was done.  All catheter exchanges were performed over a  guidewire.  There were no immediate complications.  A StarClose device  was used to seal  the femoral arteriotomy at the conclusion of the  procedure.   FINDINGS:  Aortic pressure 131/73 with a mean of 99, left ventricular  pressure is 132/20.   CORONARY ANGIOGRAPHY:  1. The left mainstem has diffuse 40% stenosis.  It bifurcates into the      LAD and left circumflex.  2. The LAD is occluded at the site of the first septal perforator.  3. The left circumflex is diffusely diseased.  The mid circumflex has      50% stenosis.  The left circumflex is dominant.  There are multiple      small OM branches visualized, the first OM appears very small.  The      second OM fills competitively, but also appears small, and appears      to have severe proximal stenosis of approximately 80%.  The      circumflex then courses down and supplies two posterolateral      branches.  Just beyond the posterolateral branches, there is      competitive filling of the left PDA.  4. The right coronary artery is  small and nondominant.  There is no      significant angiographic stenosis.  It supplies two RV marginal      branches.   SAPHENOUS VEIN GRAFT ANGIOGRAPHY:  1. The saphenous vein graft sequenced to the first diagonal and second      OM branches is widely patent.  There are no significant stenoses.      The first diagonal branch is very small.  The second OM branch is a      large vessel that supplies multiple branches.  The branches fill in      retrograde fashion from the graft flow.  2. The saphenous vein graft to the third diagonal is patent.  It has      diffuse disease, but no significant focal stenoses.  The vein graft      is comprised of a large vein that is anastomosed to a small vessel,      and  there is significant vein coronary mismatch  3. The saphenous vein graft to the left PDA is widely patent.  There      is a smooth 50% stenosis in the proximal body of the graft.  The      left PDA has no significant angiographic stenosis.  4. The left internal mammary artery is widely  patent.  It is      anastomosed to the mid-LAD.  The mid LAD fills completely via graft      flow.  There is no significant angiographic stenosis in the mid-or-      distal aspects of the LAD.  There is some retrograde filling of the      proximal LAD from the graft.   LEFT VENTRICULOGRAPHY:  Demonstrates normal LV function.  The LVEF is  65%.  There is no mitral regurgitation.   ASSESSMENT:  1. Severe native 2-vessel CAD in a patient with left coronary      dominance.  2. Patent LIMA to LAD.  3. Patent saphenous vein graft sequenced to the first diagonal branch      of the LAD and the second OM branch of the left circumflex.  4. Patent saphenous vein graft to the third diagonal branch of the      LAD.  5. Patent saphenous vein graft to the left PDA.  6. Normal left ventricular systolic function.   PLAN:  Mr. Carter has had an excellent surgical revascularization now 4  years out from bypass surgery.  He should continue with aggressive  medical therapy.  There are no areas that warrant intervention at this  point.      Veverly Fells. Excell Seltzer, MD  Electronically Signed     MDC/MEDQ  D:  12/17/2006  T:  12/18/2006  Job:  [id number]

## 2010-08-01 NOTE — Assessment & Plan Note (Signed)
[facility]                          [facility] OFFICE NOTE   NAME:Nied, KENDRE JACINTO                       MRN:          [id number]  DATE:08/06/2008                            DOB:          13-May-1950    REASON FOR VISIT:  Scheduled followup of lower extremity edema and  superficial thrombophlebitis.   HISTORY OF PRESENT ILLNESS:  Mr. Mcaffee was just recently seen on the  17th by Dr. Andee Lineman.  His history is well detailed in the previous note.  He has been treated with a combination of diuretic therapy, advancement  in lisinopril, and diclofenac due to progressive lower extremity edema  and apparent superficial thrombophlebitis.  He did undergo lower  extremity venous Dopplers which were negative for deep venous thrombosis  and also had concurrent arterial studies which were normal.  It was  recommended that he not wear lower extremity compression hose until he  had improvement in edema and pain in his legs.  He had a significant  improvement on diclofenac and Lasix, in fact states that his lower back  pain is also starting to feel better.  He does have a followup visit  scheduled with [facility] for further evaluation in this  regard.  Otherwise from a cardiac perspective, he is not describing any  angina or breathlessness.   ALLERGIES:  No known drug allergies.  He does have a SHELLFISH allergy.   MEDICATIONS:  1. Enteric-coated aspirin 325 mg p.o. daily.  2. Lopressor 50 mg p.o. b.i.d.  3. Metformin 500 mg 2 tablets p.o. b.i.d.  4. Crestor 5 mg p.o. daily.  5. Lasix 40 mg p.o. every other day.  6. Diclofenac 50 mg p.o. t.i.d. for a 3-week course.  7. Lisinopril 20 mg p.o. daily.  8. Sublingual nitroglycerin 0.4 mg p.r.n.   REVIEW OF SYSTEMS:  Outlined above.  Otherwise reviewed and negative.   PHYSICAL EXAMINATION:  VITAL SIGNS:  Blood pressure down to 146/90,  heart rate is 76, weight is 232 pounds.  GENERAL:  The patient is  comfortable in no acute distress.  NECK:  No elevated jugular venous pressure.  No loud carotid bruits.  LUNGS:  Essentially clear without labored breathing.  CARDIAC:  Regular rate and rhythm.  No S3 gallop.  EXTREMITIES:  Trace edema.  At this point evidence of venous stasis and  venous varicosities, although no residual erythema and no marked degree  of pitting at this point.  Distal pulses are 2+.   IMPRESSION AND RECOMMENDATIONS:  1. Recent episode of superficial thrombophlebitis with lower extremity      edema.  He did not have evidence of deep venous thrombosis based on      venous studies and has had significant improvement on a combination      of diuretic therapy and anti-inflammatory therapy.  At this point,      I recommended that he continue his limited course of diclofenac and      also uses Lasix on an every-other-day basis.  When his legs are      completely back to baseline, he should  start using lower extremity      compression stockings given his baseline venous varicosities,      hopefully to prevent further episodes.  2. Cardiovascular disease, status post coronary artery bypass grafting      in 2004 with documentation of patent bypass grafts at angiography      in 2008.  Ejection fraction has been normal overtime.  We will plan      to continue medical therapy in the absence of any progressive      angina or breathlessness.  A routine visit will be scheduled for      him over the next 6 months.     Jonelle Sidle, MD  Electronically Signed    SGM/MedQ  DD: 08/06/2008  DT: 08/06/2008  Job #: [id number]

## 2010-08-04 NOTE — Consult Note (Signed)
NAME:  [name] [name], [name] [name] NO.:  [id number]   MEDICAL RECORD NO.:  [id number]                   PATIENT TYPE:  INP   LOCATION:  2904                                 FACILITY:  [facility]   PHYSICIAN:  Salvatore Decent. Dorris Fetch, M.D.         DATE OF BIRTH:  Mar 02, 1951   DATE OF CONSULTATION:  01/14/2003  DATE OF DISCHARGE:                                   CONSULTATION   REASON FOR CONSULTATION:  Severe coronary artery disease.   CHIEF COMPLAINT:  Chest pressure and shortness of breath.   HISTORY OF PRESENT ILLNESS:  [name] [name] is a 60 year old gentleman with a  history of hypertension, dyslipidemia, and type 2, adult-onset noninsulin-  dependent diabetes.  He has no prior cardiac history.  He noted that he had  been feeling poorly.  He had played 27 holes of golf on Sunday and had been  feeling very tired and fatigued.  He had chest discomfort off and on he  attributed to indigestion.  At 3 a.m., on October 26, he was awakened with  shortness of breath and wheezing.  He went up to the bathroom and sat up and  felt a little better.  When he tried to lay back down, his wheezing and  shortness of breath recurred.  He was having some chest pressure which he  attributed to indigestion.  He also said he felt cold and clammy.  He then  drove himself from [place] to [place] to the [facility].  There, he was found to be hypertensive.  There were no dramatic  EKG changes, but he did have pulmonary edema on x-ray.  He was treated with  diuretics, Lopressor, clonidine, and nitrates.  His symptoms resolved.  He  has been fairly active, but he has noticed, in retrospect, that he had been  having similar, but much milder symptoms for the past several weeks and also  had increasing fatigue and lack of energy for approximately six months.   PAST MEDICAL HISTORY:  Hypertension, type 2 diabetes, hypercholesterolemia.   PAST SURGICAL HISTORY:  None.   MEDICATIONS:  1. Glucophage 500 mg p.o. daily.  2. Tenormin 100 mg p.o. daily.  3. Lisinopril unknown dose.  4. Valium p.r.n. for muscle spasms.   ALLERGIES:  He has no known drug allergies.   FAMILY HISTORY:  No history for early coronary artery disease.   SOCIAL HISTORY:  The patient lives alone in [place].  He has one adult son.  He  works for [employer].  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  He has remote minimal tobacco use history.  He does drink 4-  5 alcoholic drinks daily.   REVIEW OF SYSTEMS:  He has a history of vertigo.  He also has had increased  stress recently.  He is more fatigued more easily.  He has the chest pain.  He has had indigestion-type  symptoms for years, which he does not attribute  to his heart.  All other systems are negative.   PHYSICAL EXAMINATION:  GENERAL:  [name] [name] is a well-appearing, 60 year old  white male in no acute distress.  He is well-developed, well-nourished.  VITAL SIGNS:  Blood pressure is 122/72, pulse is 60 and regular,  respirations are 12.  NEUROLOGIC:  He is alert and oriented x3 and grossly intact.  HEENT:  Unremarkable.  NECK:  Supple without thyromegaly, adenopathy, or bruits.  CARDIAC:  Regular rate and rhythm, normal S1, S2.  No rubs, murmurs, or  gallops.  LUNGS:  Clear anteriorly.  ABDOMEN:  Soft, nontender.  EXTREMITIES:  Without clubbing, cyanosis, or edema.  He has 2+ pulses  throughout.  He has normal Allen's test on the left hand.  SKIN:  Warm, pink, and dry.   LABORATORY DATA:  Cardiac catheterization, as mentioned.  His white count  was 7.7, hematocrit 49, platelets 191.  CK was 687, MB was 65, maximum  troponin was 13.97.  Sodium 134, potassium 4.1, BUN and creatinine 17 and  1.2.  Cholesterol was 262, triglycerides 90, his HDL was 60, LDL was 184.  Glycosylated hemoglobin was 6.  PT was 12.3, PTT 31.   IMPRESSION:  [name] [name] is a 60 year old gentleman with multiple cardiac  risk  factors.  He presents with a non-Q-wave MI and a catheterization showed  severe complex disease of his LAD.  He had some moderate disease in the  circumflex.  His coronary is nondominant and his angiographically normal.  His LAD disease is not amenable to percutaneous intervention, therefore,  coronary artery bypass graft is indicated to relieve his symptoms and  myocardial preservation.  I have discussed with he and his son the  indications, risks, benefits, and alternative treatments.  They understand  the nature of the surgery including the physician to be used as well as  expected postoperative recovery time.  They understand the risks of surgery  include, but are not limited to death, stroke, MI, DVT, PE, bleeding,  possible need for transfusions, infections, as well as other organ systems  dysfunction and risk for renal or GI complications.  He understands and  accepts these risks and agrees to proceed.  [name] [name] is currently stable  and has been since admission.  He is currently on Lovenox.  He will be  scheduled for the first available OR slot which is Monday morning, November  1st, or he would develop recurrence or unstable symptoms in the interim to  be done on an emergent basis if any available operating time opens up  between now and then.  We will keep under observation based on urgency.                                               Salvatore Decent Dorris Fetch, M.D.    SCH/MEDQ  D:  01/14/2003  T:  01/14/2003  Job:  [id number]   cc:   Weyman Pedro, M.D.  [place], [place]

## 2010-08-04 NOTE — Op Note (Signed)
NAME:  [name] [name], STROHMEIER                          ACCOUNT NO.:  [id number]   MEDICAL RECORD NO.:  [id number]                   PATIENT TYPE:  INP   LOCATION:  2315                                 FACILITY:  [facility]   PHYSICIAN:  Salvatore Decent. Dorris Fetch, M.D.         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   PROCEDURE:  1. Median sternotomy.  2. Extracorporeal circulation.  3. Coronary artery bypass grafting times five (left internal mammary artery     to the left anterior descending, sequential saphenous vein graft to the     first diagonal and first obtuse marginal, saphenous vein graft to the     third diagonal, saphenous vein graft to the posterior descending artery).   SURGEON:  Salvatore Decent. Dorris Fetch, M.D.   ASSISTANTEber Jones A. Eustaquio Boyden.   ANESTHESIA:  General.   FINDINGS:  The first diagonal is small.  The remaining coronaries are good  size and quality.  The second diagonal was too small to graft.  Left  ventricular hypertrophy, normal ascending aorta, good quality conduits.   CLINICAL NOTE:  The patient is a 60 year old gentleman with diabetes.  He  presented with a non-Q wave myocardial infarction with peak CK of 680 and an  MB of 65.  He underwent cardiac catheterization which revealed a left  dominant circulation. He had severe and complex disease in his LAD which was  not amenable to percutaneous intervention.  He also had mild to moderate  disease in his circumflex.  There was no significant disease in the non-  dominant right coronary artery.  The patient was referred for coronary  artery bypass grafting.  The indications, risks, benefits and  alternatives  were discussed in detail with the patient.  He understood and accepted the  risks of surgery and agreed to proceed.  I did discuss the options of  grafting or not grafting the obtuse marginal one and the posterior  descending arising from the circumflex due to their moderate disease, but  feeling that there was likely 50% stenosis in each of the vessels and some  diffuse disease at the origin of the posterior descending artery.  The  decision was made to proceed with grafting of those vessels as well.  There  was not significant enough disease to warrant arterial grafting because of  concern for competitive flow.   DESCRIPTION OF PROCEDURE:  The patient was brought to the preoperative  holding area on January 18, 2003.  Lines were placed and monitored arterial,  central venous and pulmonary arterial pressure and intravenous antibiotics  were administered.  The patient was taken to  the operating room,  anesthetized and intubated.  A Foley catheter was placed.  The chest,  abdomen and legs were prepped and draped in the usual fashion.   A median sternotomy was performed.  The left internal mammary artery was  harvested using standard technique.  Simultaneously, an incision was made in  the medial aspect of the right leg at the level of the knee.  The greater  saphenous vein was identified and it was harvested endoscopically from the  right thigh and a small segment was harvested from the upper calf using a  bridged open technique.  The saphenous vein was of good quality as was the  mammary artery.  The patient was fully heparinized prior to dividing the  distal end of the mammary artery.  There was excellent flow through the cut  end of the vessel.   The pericardium was opened and the ascending aorta was inspected and  palpated.  It was relatively short in length, but did allow for placement of  clamp in proximals.  The aorta was cannulated via concentric 2-0 Ethibond   pledgeted pursestring sutures.  A dual stage venous cannula was placed via a  pursestring suture in the right atrial appendage.  Cardiopulmonary bypass  was instituted and the patient was cooled to 32 degrees Celsius.  The  coronary arteries were inspected and anastomotic sites were chosen.  The  conduits were inspected and cut to length.  A foam pad was placed in the  pericardium to protect the left phrenic nerve. A temperature probe was  placed in the myocardial septum and a cardioplegia cannula was placed in the  ascending aorta.   The aorta was cross clamped.  The left ventricle was emptied via the aortic  root.  Banked cardiac arrest was then achieved with a combination of cold  antegrade blood cardioplegia and topical ice saline.  With the  administration of the antegrade blood cardioplegia, there was immediate and  rapid diastolic arrest; however, there was sluggish septal cooling.  The  decision was made to proceed with grafting of the third diagonal first to  allow additional cooling of the anterior and septal wall.   The following distal anastomoses were then performed.  First, a reverse  saphenous vein graft was placed end to side to the third diagonal branch of  the LAD.  This was a 1.5 mm vessel and it bifurcated just beyond the  anastomosis and only a 1 mm probe passed distally.  The saphenous vein was  of good quality and relatively large caliber.  The anastomosis was performed  with a running 7-0 Prolene suture in an end to side fashion.  There was  excellent flow through the graft.  Cardioplegia was administered.  There was  additional septal cooling to 12 degrees Celsius.   Next, a reverse saphenous vein graft was placed end to side to the posterior  descending branch of the left circumflex coronary artery.  The posterior  descending artery had significant palpable atherosclerotic plaque in its  proximal portion.  It was grafted beyond the palpable plaque.  It was a  1.5 mm vessel.  It was of good quality.  The end to side anastomosis was  performed with a running 7-0 Prolene suture.  There was excellent flow  through the graft.  Cardioplegia was administered and there was good  hemostasis.   Next, a reverse saphenous vein graft was placed sequentially to the first  diagonal and first obtuse marginal. The first  diagonal was a high  anterolateral branch and it was 1 mm in diameter.  A side to side  anastomosis was performed off the side branch of the vein graft of this  vessel using a running 7-0 Prolene suture.  It was probed proximally and  distally to ensure patency.  There was good flow through this anastomosis.  The distal end of the vein graft then was cut to length and was anastomosed  end to side to the first obtuse marginal branch of the circumflex.  This was  a 1.5 mm good quality vessel at the site of the anastomosis.  At the  completion fo the second anastomosis, cardioplegia was administered and  there was good hemostasis at both anastomoses.   Next, the left internal mammary artery was brought through a window in the  pericardium.  The distal end was spatulated and it was then anastomosed end  to side to the distal LAD.  The LAD was more diffusely diseased than  appreciated on catheterization, but was a 1.5 mm good quality target at the  site of the anastomosis.  The mammary was a 2 mm good quality conduit.  The  end to side anastomosis was performed with a running 8-0 Prolene suture.  At  the completion of the mammary to LAD anastomosis the bull dog clamp was  briefly removed to inspect for hemostasis.  There was immediate rise in the  septal temperature and the bull dog clamp was replaced.  Systemic rewarming  was initiated.  The mammary pedicle was tacked to the epicardial surface of  the heart with 6-0 Prolene sutures.  Additional cardioplegia was  administered down the vein grafts.   Next, the cardioplegia cannula was removed from  the ascending aorta and the  proximal vein graft anastomoses were performed to 4.4 mm punch aortotomies  with running 6-0 Prolene sutures.  At the completion of the final proximal  anastomosis the patient was placed in Trendelenburg position.  The bull dog  clamp was again removed from the mammary artery and immediate and rapid  septal rewarming was noted.  Lidocaine was administered after completely de-  airing the aortic root.  The aortic cross clamp was removed.  The total  cross clamp time was 82 minutes.   The patient required two defibrillations with 20 joules.  He then maintained  sinus rhythm thereafter.  All proximal and distal anastomoses were inspected  for hemostasis.  Septal epicardial pacing wires were placed on the right  ventricle and right atrium.  When the patient had been re-warmed to a core  temperature of 37 degrees Celsius, he was weaned from cardiopulmonary bypass  without difficulty.  He was in sinus rhythm and on no inotropic support at the time of separation from bypass.  Total bypass time was 138 minutes.   A test dose of Protamine was administered and was well tolerated.  The  atrial and aortic cannulae were removed.  The remainder of the Protamine was  administered without incident.  The chest was irrigated with one liter of  warm normal saline containing one gram of vancomycin.  Hemostasis was  achieved.  A left pleural and two mediastinal chest tubes were placed  through separate subcostal incisions and secured with heavy silk sutures.  The pericardium was loosely reapproximated with interrupted 3-0 silk sutures  that came together easily without tension.  The sternum was closed with  heavy gauge interrupted stainless steel wires.  The remainder of the  incisions were  closed in standard fashion.  Subcuticular skin closures were  utilized.  At the completion of the procedure the patient was brought from  the operating room to the surgical intensive care unit,  intubated and in  critical but stable condition. All sponge, needle and instrument counts were  correct.                                               Salvatore Decent Dorris Fetch, M.D.    SCH/MEDQ  D:  01/18/2003  T:  01/18/2003  Job:  [id number]   cc:   Rollene Rotunda, M.D.   Nena Jordan

## 2010-08-04 NOTE — H&P (Signed)
NAME:  [name] [name], [name] [name] NO.:  [id number]   MEDICAL RECORD NO.:  [id number]                   PATIENT TYPE:  INP   LOCATION:  1823                                 FACILITY:  [facility]   PHYSICIAN:  Rollene Rotunda, M.D.                DATE OF BIRTH:  1950/11/02   DATE OF ADMISSION:  01/12/2003  DATE OF DISCHARGE:                                HISTORY & PHYSICAL   REASON FOR ADMISSION:  Shortness of breath.   HISTORY OF PRESENT ILLNESS:  The patient is a pleasant 60 year old white  gentleman with no prior cardiac history.  He did notice on Sunday, that he  did not feel well.  He had some chest discomfort off and on that he thought  was indigestion.  He felt relatively well yesterday.  However, at 3 a.m.  this morning, he awoke with shortness of breath.  He had some wheezing.  He  sat up and went to the bathroom.  He tried to lay back down and this  recurred.  This happened twice.  He did have some mild chest pressure which  he again thought was indigestion.  He had no radiation to his neck or to his  arms.  He denied any palpitations, presyncope or syncope.  He drove himself  to [employer] [facility].  There, he was found to be hypertensive  with a blood pressure of 180/100.  He had no acute EKG changes. He did have  pulmonary edema on chest x-ray. This responded to Lasix.  His blood pressure  has responded to treatment with clonidine, Lopressor, and IV nitrates.  He  now feels back to baseline.   He says he has been otherwise feeling relatively well.  He had not had any  new symptoms.  He works at an active job and had not noticed any decreased  exercise tolerance or pain.  He had had no other anginal equivalents.  He  has been under a lot of stress and has been drinking more alcohol than  usual.  He has also been eating takeout food with lots of pizza.   ALLERGIES:  No known drug allergies.   MEDICATIONS:  1. Glucophage 500 mg daily.  2.  Tenormin 100 mg daily.  3. Lisinopril (dose not clear).   PAST MEDICAL HISTORY:  1. Diabetes mellitus x1 year.  2. Hypertension x4 to 5 years.  3. Chest pain (the patient reports a negative Cardiolite one year ago).   PAST SURGICAL HISTORY:  None.   SOCIAL HISTORY:  The patient lives alone in [place], [place].  He has  one adult son.  He works in [employer] and [employer].  He smoked for  four years when he was a teenager.  He drinks four to five beers per day and  also four to five ounces of liquor.   FAMILY HISTORY:  Noncontributory for early  coronary artery disease.   REVIEW OF SYMPTOMS:  As stated in the HPI.  Positive for increased stress  with death in the family and a son losing his job, vertigo.  As stated in  the HPI and negative for all other systems.   PHYSICAL EXAMINATION:  GENERAL APPEARANCE:  The patient is in no distress.  VITAL SIGNS:  Blood pressure initially 190/115, heart rate 77 and regular,  afebrile, respiratory rate 16, 95% saturation on room air.  HEENT:  Eyelids unremarkable.  Pupils are equal, round and reactive to  light.  Fundi not visualized.  Oral mucosa unremarkable.  NECK:  No jugular venous distension.  Wave form within normal limits.  Carotid upstroke brisk and symmetric, no bruits, no thyromegaly.  LYMPHATICS:  No cervical, axillary or inguinal adenopathy.  CHEST:  Unremarkable.  LUNGS:  Clear to auscultation bilaterally.  BACK:  No costovertebral angle tenderness.  CARDIOVASCULAR:  PMI not displaced or sustained.  S1 and S2 within normal  limits.  No S3, no S4, no murmurs.  ABDOMEN:  Mildly obese, positive bowel sounds, normal in frequency and  pitch, no bruits, no guarding, no rebound, no midline pulsatile mass.  No  organomegaly.  SKIN:  No rash, no nodules.  EXTREMITIES:  There are 2+ pulses throughout, no clubbing, cyanosis, or  edema.  NEUROLOGIC:  Oriented to person, place and time.  Cranial nerves II-XII  grossly intact.  Motor grossly intact.   EKG sinus rhythm, rate 80, axis within normal limits, intervals within  normal limits, no acute STT wave changes.   ASSESSMENT/PLAN:  1. Shortness of breath.  The patient had pulmonary edema on chest x-ray.  He     responded to Lasix.  He did have some chest discomfort, had mildly     elevated enzymes.  I think this most likely represents an acute ischemic     episode.  I doubt left ventricular systolic dysfunction.  A second     possibility could be hypertensive urgency with a subsequent slight     troponin elevation.  The plan will be for IV nitroglycerin and heparin.     He will be treated with beta-blockers and aspirin.  He will have elective     cardiac catheterization and echocardiograph.  We will continue to cycle     serial enzymes.  2. Hypertension.  He did take his blood pressure medicines this morning.     Will need to adjust these based on blood pressure readings in the     hospital.  3. Risk reduction.  Will check lipid profile.  4. Diabetes mellitus.  Will hold his Glucophage pending cardiac     catheterization.  He will have sliding scale insulin.  He will have     education with diet.  5. Alcohol.  Will prophylax against withdrawal and he will have education     with the need for alcohol cessation.                                                Rollene Rotunda, M.D.    JH/MEDQ  D:  01/12/2003  T:  01/12/2003  Job:  [id number]   cc:   Nena Jordan

## 2010-08-04 NOTE — Discharge Summary (Signed)
NAME:  [name] [name], [name] [name]                          ACCOUNT NO.:  [id number]   MEDICAL RECORD NO.:  [id number]                   PATIENT TYPE:  INP   LOCATION:  4712                                 FACILITY:  [facility]   PHYSICIAN:  Maple Mirza, P.A.              DATE OF BIRTH:  03/23/1950   DATE OF ADMISSION:  04/05/2003  DATE OF DISCHARGE:  04/06/2003                                 DISCHARGE SUMMARY   DISCHARGE DIAGNOSIS:  1. Admitted with atypical chest pain.  Serial cardiac enzymes are negative.     Electrocardiogram shows sinus rhythm with T-wave inversions in leads II,     III and AVF.  2. Status post recent coronary artery bypass graft surgery times four     November, 2004, at [facility].   SECONDARY DIAGNOSES:  1. Diabetes.  2. Hypertension.  3. Dyslipidemia.   PROCEDURES:  1. April 05, 2003, computed tomogram of the chest which shows integrity of     the median sternotomy without evidence of abscess or inflammation, and     also the CT of the chest was negative for pulmonary embolism.  2. April 06, 2003, adenosine Cardiolite study.  Study was negative for     ischemia.  Ejection fraction estimated at 67%.  Patient at the time of     discharge on April 06, 2003, after undergoing adenosine Cardiolite is     chest pain free.  He is not having any trouble with breathing.  He is not     having nausea, vomiting, diaphoresis.  He does have some minor chest pain     which has been ongoing, left-sided at the costovertebral angle.   DISCHARGE MEDICATIONS:  1. Enteric coated aspirin 325 mg daily.  2. Metoprolol 50 mg twice daily.  3. Lisinopril 10 mg daily.  4. Glucophage 500 mg daily.  5. Zocor 40 mg daily at bedtime.  6. Xanax 0.25 mg one tab every eight hours as needed.  7. Nitroglycerin 0.4 mg one tab under the tongue every five minutes as     needed times three doses for chest pain.   PAIN MANAGEMENT:  Not applicable.   ACTIVITY:  As tolerated.   DISCHARGE DIET:  Low-sodium, low-cholesterol, ADA diet.   He has an office visit with Dr. Nona Dell in the [facility] office of  [facility] April 22, 2003, at 11:45 in the morning.   BRIEF HISTORY AND PHYSICAL:  [name] [name] is a 60 year old patient of Dr.  Eliberto Ivory and also Dr. Nona Dell.  He has had coronary artery bypass  graft surgery November of 2004.  He is a diabetic.  He has been having  somewhat atypical chest pain over the last day or so.  He mentions that he  is under a lot of stress.  The patient is divorced, has one son and is  having some difficulty with his relationship with his ex-wife.  His pain is  more located on the left side, not particularly exertional, there is some  pleuritic component.  He has been compliant with his medications.  He will  be admitted, cardiac enzymes will be cycled to rule out myocardial  infarction.  He will have a computed tomogram of the chest to rule out  pulmonary embolus and to check the integrity of the median sternotomy  incision and he will undergo adenosine Cardiolite.  If the adenosine  Cardiolite is negative he will be discharged home.  The patient has also  been placed on IV heparin during his hospitalization.   HOSPITAL COURSE:  Is as described in discharge disposition.  He was admitted  April 05, 2003, with chest pain.  His myocardial infarction has been ruled  out with serial cardiac enzymes.  He has been maintained on IV heparin  throughout this hospitalization and underwent adenosine Cardiolite which  showed no evidence of ischemia, ejection fraction of 67%.  The patient  discharged with medications and follow-up with Dr. Diona Browner on April 22, 2003, at 11:45 a.m.                                                Maple Mirza, P.A.    GM/MEDQ  D:  04/06/2003  T:  04/07/2003  Job:  [id number]   cc:   Jonelle Sidle, M.D. [facility]   [place], Dr.  [facility]  [address]  [place], [place] [address]

## 2010-08-04 NOTE — Discharge Summary (Signed)
NAME:  [name] [name], [name] [name] NO.:  [id number]   MEDICAL RECORD NO.:  [id number]                   PATIENT TYPE:  INP   LOCATION:  2009                                 FACILITY:  [facility]   PHYSICIAN:  Salvatore Decent. Dorris Fetch, M.D.         DATE OF BIRTH:  06-11-1950   DATE OF ADMISSION:  01/12/2003  DATE OF DISCHARGE:  01/22/2003                                 DISCHARGE SUMMARY   PRIMARY ADMITTING DIAGNOSIS:  Shortness of breath.   ADDITIONAL/DISCHARGE DIAGNOSES:  1. Severe coronary artery disease.  2. Hypertension.  3. Dyslipidemia.  4. Type 2 non-insulin-dependent diabetes mellitus.  5. Non-Q-wave myocardial infarction.   PROCEDURES PERFORMED:  1. Cardiac catheterization.  2. Coronary artery bypass grafting x5 (left internal mammary artery to the     left anterior descending, sequential saphenous vein graft to the first     diagonal and first obtuse marginal, saphenous vein graft to the third     diagonal, saphenous vein graft to the posterior descending).  3. Endoscopic vein harvest, right leg.   HISTORY:  The patient is a 60 year old male with no previous cardiac  history.  Over the several days prior to this admission, he had noted that  he was more fatigued than usual.  He had had some chest discomfort which he  attributed to indigestion.  In the early morning hours on January 12, 2003,  he was awakened with shortness of breath and wheezing.  He felt a little bit  better, once he sat up, but when he tried to lie back down, the shortness of  breath recurred.  He also had some chest pressure he again he thought was  indigestion.  When his symptoms did not resolve, he drove himself from [place]  to [place] to the [facility] Emergency Department.  He was found to be  hypertensive with a blood pressure of 180/100 on admission.  He was also  noted to have some pulmonary edema on chest x-ray but no acute EKG changes.  He was initially treated with Lasix,  Lopressor, clonidine and IV nitrates  with some relief of his symptoms.  He was seen by Dr. Rollene Rotunda and it  was felt that he should be admitted and ruled out for myocardial infarction.   HOSPITAL COURSE:  He remained stable and pain-free following admission.  He  was ruled in for a non-Q-wave myocardial infarction.  He underwent cardiac  catheterization on January 13, 2003 and was found to have severe coronary  artery disease including severe LAD stenosis involving all three diagonals.  The lesion in the LAD was complex and was not felt to be amenable to  percutaneous intervention.  He also had mild-to-moderate disease in the  circumflex.  Because of his anatomy, he was referred for cardiothoracic  surgery consultation.  Dr. Dorris Fetch saw the patient and reviewed his  films and agreed that surgical revascularization was his best option.  After  complete discussion with the patient about the risks, benefits and  alternatives, the patient agreed to proceed with surgery.  He was taken to  the operating room on January 18, 2003 and underwent CABG x5 as described in  detail above.  He tolerated the procedure well and was transferred to the  SICU in stable condition.  He was initially maintained on the Glucomander  protocol and was switched to Lantus insulin on postop day 1.  He was  hemodynamically stable and doing well on postop day 1.  At that time, his  chest tubes and hemodynamic monitoring devices were discontinued and he was  able to be transferred to the floor.  Postoperatively, he has done very  well.  He has been gently diuresed and is back down to within 2 to 3 pounds  of his preoperative weight.  He has been ambulating in the halls with  cardiac rehab phase 1, as well as independently, and is doing very well.  His labs have remained stable, with his most recent hemoglobin and  hematocrit at 11.5 and 33.2, respectively.  Other labs show white blood  count 11.3, platelets  164,000, potassium 4.1, BUN 15, creatinine 1.2.  As  his p.o. intake has improved, the Lantus was discontinued and his home dose  of Glucophage was restarted.  His blood sugars have been very stable back on  his home medications.  He has been treated with aggressive pulmonary toilet  measures and incentive spirometry and has been weaned from supplemental  oxygen.  He has remained afebrile and all vital signs have been stable.  His  surgical incisions sites are healing well without difficulty.  He is  tolerating a regular diet and is having normal bowel and bladder function.  He has remained in normal sinus rhythm throughout the admission.  It is felt  that if he remains stable over the next 24 hours, he should hopefully be  ready for discharge home on January 22, 2003.   DISCHARGE MEDICATIONS:  1. Enteric-coated aspirin 325 mg daily.  2. Zocor 40 mg nightly.  3. Lopressor 25 mg b.i.d.  4. Glucophage 400 mg daily.  5. Tylox one to two q.4 h. p.r.n. for pain.   DISCHARGE INSTRUCTIONS:  He is to refrain from driving, heavy lifting or  strenuous activity.  He may continue daily ambulation and use of his  incentive spirometer.  He is asked to continue a low-fat, low-sodium diet.  He is asked to shower daily and clean his incisions with soap and water.   DISCHARGE FOLLOWUP:  He will see Dr. Illene Bolus. [facility] on February 04, 2003 at 2 p.m.  He will have a chest x-ray at that visit and should  bring his x-ray to his followup visit with Dr. Viviann Spare C. Hendrickson.  He  will see Dr. Dorris Fetch on Wednesday, February 19, 2003, at 11:30 a.m.  He  is asked to call our office in the interm if he experiences any problems or  has any questions.      Coral Ceo, P.A.                        Salvatore Decent Dorris Fetch, M.D.    GC/MEDQ  D:  01/21/2003  T:  01/22/2003  Job:  [id number]   cc:   Wyline Copas, M.D. [facility]

## 2010-08-04 NOTE — Cardiovascular Report (Signed)
NAME:  [name] [name], [name] [name]                          ACCOUNT NO.:  [id number]   MEDICAL RECORD NO.:  [id number]                   PATIENT TYPE:  INP   LOCATION:  2904                                 FACILITY:  [facility]   PHYSICIAN:  Salvadore Farber, M.D.             DATE OF BIRTH:  [birth year]   DATE OF PROCEDURE:  01/13/2003  DATE OF DISCHARGE:                              CARDIAC CATHETERIZATION   PROCEDURE:  Left heart catheterization, left ventriculography, coronary  angiography.   INDICATION:  [name] is a 60 year old gentleman with diabetes mellitus,  hypertension, and alcohol abuse who presents with non-ST segment elevation  myocardial infarction complicated by heart failure.  CK rose to 687 with MB  of 65.  He was referred for diagnostic angiography.   PROCEDURAL TECHNIQUE:  Informed consent was obtained.  Under 1% lidocaine  local anesthesia, a 6 French sheath was placed in the right femoral artery  using the modified Seldinger technique.  Diagnostic angiography and  ventriculography were performed using JL-4, JR-4, and pigtail catheters.  The patient tolerated the procedure well and was transferred to the holding  room in stable condition.  Sheaths will be removed there.   COMPLICATIONS:  None.   FINDINGS:  1. LV:  146/15/31. EF 55% with severe anterolateral hypokinesis extending     from the mid portion of the anterior wall out to the apex.  2. No aortic stenosis or mitral regurgitation.  3. Left main:  Angiographically normal.  4. LAD:  The LAD is diffusely and severely diseased beginning proximally and     extending throughout the mid segment.  There is up to 90% stenosis.  This     segmented disease involves the ostia of all three diagonal branches.  The     second two diagonal branches have 80% ostial stenoses themselves.  5. Circumflex:  The circumflex is a large and dominant vessel.  It gives     rise to two obtuse marginals and the PDA.  There is a 30% stenosis of  the     proximal vessel.  The first obtuse marginal has a proximal 30% stenosis.     The PDA has a 40% stenosis.  6. RCA:  Small, nondominant vessel which is angiographically normal.   IMPRESSION/RECOMMENDATIONS:  The patient has long severe disease of the LAD  which covers the takeoff of all three diagonals.  The diagonals two and  three have severe ostial disease themselves.  In the setting of his diabetes  mellitus and the length of the stenosis involving the diagonals, I recommend  coronary artery bypass grafting.  Will refer to CVTS for consideration.  Anticoagulation will be continued in the interim.  Salvadore Farber, M.D.    WED/MEDQ  D:  01/13/2003  T:  01/13/2003  Job:  [id number]   cc:   Dr. Weyman Pedro in [place]

## 2010-08-04 NOTE — H&P (Signed)
NAME:  [name] [name], [name] [name] NO.:  [id number]   MEDICAL RECORD NO.:  [id number]                   PATIENT TYPE:  INP   LOCATION:  1824                                 FACILITY:  [facility]   PHYSICIAN:  Charlton Haws, M.D.                  DATE OF BIRTH:  [birth year]   DATE OF ADMISSION:  04/05/2003  DATE OF DISCHARGE:                                HISTORY & PHYSICAL   HISTORY OF PRESENT ILLNESS:  Ms. [name] [name] is a 60 year old patient of Dr.  Eliberto [name], Dr. Jonelle [name] and Dr. Rollene [name].  He has had CABG  surgery by Dr. Viviann Spare C. [name] in November of this past year.  He is a  diabetic.   His CABG involved LIMA to the LAD, vein graft to the diagonal and OM, vein  graft to second diagonal and vein graft to the PDA.  The patient has been  having somewhat atypical chest pain over the last day or so.  He is under a  lot of stress.  Apparently is divorced with 1 son and is having some  difficulty with the relationship with his ex-wife.  His pain is kind of left-  sided.  It is not particularly exertional.  There is some pleuritic  component to it.   __________ the patient is a diabetes, his sugars have been okay.  He is not  on insulin.   He has been compliant with his medications.   Other coronary risk factors include hypertension.   PAST SURGICAL HISTORY:  The patient has not had other surgery aside from his  CABG.   ALLERGIES:  He is allergic to [facility], [facility], [facility].  He was premedicated prior this  catheterization and had no difficulties.   REVIEW OF SYSTEMS:  He does not have particular shortness of breath.  He has  had a little bit of nausea and diaphoresis.  He does not have a history of  gallbladder disease.   MEDICATIONS:  His medications include:  1. Glucophage 500 mg a day.  2. Tenormin 100 mg a day.  3. Lisinopril 20 mg a day.   PHYSICAL EXAMINATION:  GENERAL:  His examination is remarkable for not  appearing in particular distress.  He is  somewhat anxious.  VITAL SIGNS:  Blood pressure is 130/71.  Pulse is 70 and regular.  LUNGS:  Lungs are clear.  CARDIAC:  Carotids normal.  There is an S1 and S2 with normal heart sounds.  Sternum appears well-healed.  He is still a little tender in the subxiphoid  area where his chest tube sites were.  ABDOMEN:  Right upper quadrant is not particularly tender.  There is no  abdominal aortic aneurysm.  EXTREMITIES:  Distal pulses are intact and no edema.   LABORATORY AND ACCESSORY CLINICAL DATA:  His EKG shows sinus rhythm with  right bundle branch block.  Previous T wave inversions in the  inferior leads  are slightly more prominent.   Blood work is pending.   Chest x-ray shows no active disease.   IMPRESSION:  [name] [name] has pain soon after bypass surgery.  He has no acute  electrocardiographic changes.  __________  ruled out for myocardial  infarction.  I would start him on heparin.   If he rules for myocardial infarction, I think an adenosine Cardiolite is  reasonable.   His blood pressure seems reasonably controlled.   We will have a CT scan of the chest in the emergency room to make sure his  sternum is well-healed and there are no signs of infection or pulmonary  embolus.   We will continue his Glucophage for his diabetes.   The patient will talk to Dr. Eliberto [name] further about some of his stresses.  He  has not returned to work and is not due to do this until February.   Further recommendations will be based on the results of his blood work and  probable Cardiolite study.                                                Charlton Haws, M.D.    PN/MEDQ  D:  04/05/2003  T:  04/05/2003  Job:  [id number]

## 2010-08-17 ENCOUNTER — Encounter: Payer: Self-pay | Admitting: *Deleted

## 2010-08-22 ENCOUNTER — Encounter: Payer: Self-pay | Admitting: *Deleted

## 2010-08-22 ENCOUNTER — Other Ambulatory Visit: Payer: Self-pay | Admitting: *Deleted

## 2010-08-22 MED ORDER — WARFARIN SODIUM 5 MG PO TABS: 5.0000 mg | ORAL_TABLET | ORAL | Status: DC

## 2010-08-22 NOTE — Progress Notes (Signed)
  Pt mailed certified letter on 08/17/10 regarding lab work for FLP/LFT ordered by Gene in March.  Received response card on 6/4 signed by pt on 08/18/10.

## 2010-08-24 ENCOUNTER — Encounter: Payer: BC Managed Care – PPO | Admitting: *Deleted

## 2010-09-07 ENCOUNTER — Ambulatory Visit (INDEPENDENT_AMBULATORY_CARE_PROVIDER_SITE_OTHER): Payer: BC Managed Care – PPO | Admitting: *Deleted

## 2010-09-07 DIAGNOSIS — Z7901 Long term (current) use of anticoagulants: Secondary | ICD-10-CM

## 2010-09-07 DIAGNOSIS — I4892 Unspecified atrial flutter: Secondary | ICD-10-CM

## 2010-10-05 ENCOUNTER — Encounter: Payer: BC Managed Care – PPO | Admitting: *Deleted

## 2010-10-09 ENCOUNTER — Ambulatory Visit (INDEPENDENT_AMBULATORY_CARE_PROVIDER_SITE_OTHER): Payer: BC Managed Care – PPO | Admitting: *Deleted

## 2010-10-09 DIAGNOSIS — Z7901 Long term (current) use of anticoagulants: Secondary | ICD-10-CM

## 2010-10-09 DIAGNOSIS — I4892 Unspecified atrial flutter: Secondary | ICD-10-CM

## 2010-11-02 ENCOUNTER — Ambulatory Visit (INDEPENDENT_AMBULATORY_CARE_PROVIDER_SITE_OTHER): Payer: BC Managed Care – PPO | Admitting: *Deleted

## 2010-11-02 DIAGNOSIS — I4892 Unspecified atrial flutter: Secondary | ICD-10-CM

## 2010-11-02 DIAGNOSIS — Z7901 Long term (current) use of anticoagulants: Secondary | ICD-10-CM

## 2010-11-02 LAB — POCT INR: INR: 1.9

## 2010-11-22 ENCOUNTER — Encounter: Payer: Self-pay | Admitting: Cardiology

## 2010-11-23 ENCOUNTER — Ambulatory Visit (INDEPENDENT_AMBULATORY_CARE_PROVIDER_SITE_OTHER): Payer: BC Managed Care – PPO | Admitting: *Deleted

## 2010-11-23 ENCOUNTER — Encounter: Payer: Self-pay | Admitting: Cardiology

## 2010-11-23 ENCOUNTER — Ambulatory Visit (INDEPENDENT_AMBULATORY_CARE_PROVIDER_SITE_OTHER): Payer: BC Managed Care – PPO | Admitting: Cardiology

## 2010-11-23 VITALS — BP 144/75 | HR 58 | Resp 18 | Ht 70.0 in | Wt 243.1 lb

## 2010-11-23 DIAGNOSIS — Z7901 Long term (current) use of anticoagulants: Secondary | ICD-10-CM

## 2010-11-23 DIAGNOSIS — I2510 Atherosclerotic heart disease of native coronary artery without angina pectoris: Secondary | ICD-10-CM

## 2010-11-23 DIAGNOSIS — I4892 Unspecified atrial flutter: Secondary | ICD-10-CM

## 2010-11-23 DIAGNOSIS — R609 Edema, unspecified: Secondary | ICD-10-CM

## 2010-11-23 DIAGNOSIS — E782 Mixed hyperlipidemia: Secondary | ICD-10-CM

## 2010-11-23 DIAGNOSIS — I10 Essential (primary) hypertension: Secondary | ICD-10-CM

## 2010-11-23 LAB — POCT INR: INR: 1.8

## 2010-11-23 NOTE — Assessment & Plan Note (Signed)
Reportedly less problematic, requiring Lasix on an as-needed basis.

## 2010-11-23 NOTE — Assessment & Plan Note (Signed)
No changes to present medical regimen. Have recommended weight loss and sodium restriction over time.

## 2010-11-23 NOTE — Assessment & Plan Note (Signed)
History of postoperative atrial fibrillation and flutter, maintaining sinus rhythm status post prior cardioversion. He continues on Coumadin with followup in the Coumadin clinic. No reported bleeding problems.

## 2010-11-23 NOTE — Patient Instructions (Signed)
Your physician recommends that you continue on your current medications as directed. Please refer to the Current Medication list given to you today.  Your physician recommends that you schedule a follow-up appointment in: 6 months  

## 2010-11-23 NOTE — Progress Notes (Signed)
Clinical Summary [name] [name] is a 60 y.o.male presenting for followup. He was seen by [name] [name] in the [place] office back in March.  He presents for a routine visit, states he is undergoing evaluation by the [facility] in an attempt to regain his driver's license. There is some question as to his lung capacity, and apparently he is being scheduled for formal evaluation with pulmonary function tests. Today I completed the cardiovascular section of his history form.  He denies any significant palpitations, has NYHA class II dyspnea at baseline, no angina.  Followup ECG is reviewed below.  He states that he is due for labs with assessment of lipids in October.   Allergies  Allergen Reactions  . Iohexol      Desc: THROAT SWELLING-REQUIRES 13 HR PREP     Medication list reviewed.  Past Medical History  Diagnosis Date  . Coronary atherosclerosis of native coronary artery     Multivessel, LVEF 65%  . Superficial thrombophlebitis   . Varicose veins   . Diabetes mellitus, type 2   . Hyperlipidemia   . Chronic back pain   . Essential hypertension, benign   . Atrial fibrillation     Postoperative  . Atrial flutter     Postoperative    Social History [name] [name] reports that he has quit smoking. His smoking use included Cigarettes. He has never used smokeless tobacco. [name] [name] reports that he drinks alcohol.  Review of Systems Otherwise negative except as outlined.  Physical Examination Filed Vitals:   11/23/10 0909  BP: 144/75  Pulse: 58  Resp: 18  Obese male in no acute distress. HEENT: Conjunctiva and lids normal, oropharynx with moist mucosa. Neck: Supple, no elevated JVP or carotid bruits, no thyromegaly. Lungs: Clear to auscultation, nonlabored. Cardiac: Regular rate and rhythm, no S3. Abdomen: Soft, nontender, bowel sounds present. Skin: Warm and dry, somewhat ruddy complexion. Extremities: Trace ankle edema, distal pulses 1-2+. Musculoskeletal: No  kyphosis. Neuropsychiatric: Alert and oriented x3, affect grossly appropriate.   ECG Sinus bradycardia at 59.  Studies Echocardiogram 08/05/2008: 1. The left ventricular chamber size is normal. Mild concentric left  ventricular hypertrophy is observed. Global left ventricular wall  motion and contractility are within normal limits. The estimated  ejection fraction is 55-60%.  2. The left atrium is mild to moderately dilated.  3. There is mild mitral regurgitation.  4. Mild aortic leaflet calcification is visualized. There is no  evidence of aortic regurgitation. The mean gradient of the aortic  valve is 3 mmHg.  5. The right ventricle is mildly dilated. The right ventricular  global systolic function is normal.  6. There is mild tricuspid regurgitation. The right ventricular  systolic pressure is calculated at 30 mmHg.  7. There is no pericardial effusion.   Problem List and Plan

## 2010-11-23 NOTE — Assessment & Plan Note (Signed)
Reported followup in October with lab work.

## 2010-11-23 NOTE — Assessment & Plan Note (Signed)
Symptomatically stable on medical therapy. ECG reviewed. For now continue observation.

## 2010-11-30 ENCOUNTER — Encounter: Payer: BC Managed Care – PPO | Admitting: *Deleted

## 2010-12-14 ENCOUNTER — Encounter: Payer: BC Managed Care – PPO | Admitting: *Deleted

## 2010-12-15 ENCOUNTER — Encounter: Payer: Self-pay | Admitting: *Deleted

## 2010-12-16 ENCOUNTER — Other Ambulatory Visit: Payer: Self-pay | Admitting: Cardiology

## 2010-12-18 LAB — CBC
Platelets: 186
RDW: 14.8
WBC: 6.9

## 2010-12-18 LAB — POCT CARDIAC MARKERS
CKMB, poc: 1.2
CKMB, poc: 1.6
Myoglobin, poc: 70
Myoglobin, poc: 73.6
Troponin i, poc: <0.05

## 2010-12-18 LAB — POCT I-STAT, CHEM 8
BUN: 16
Chloride: 106
HCT: 44
Potassium: 4.8
Sodium: 138

## 2010-12-21 ENCOUNTER — Ambulatory Visit (INDEPENDENT_AMBULATORY_CARE_PROVIDER_SITE_OTHER): Payer: BC Managed Care – PPO | Admitting: *Deleted

## 2010-12-21 DIAGNOSIS — Z7901 Long term (current) use of anticoagulants: Secondary | ICD-10-CM

## 2010-12-21 DIAGNOSIS — I4892 Unspecified atrial flutter: Secondary | ICD-10-CM

## 2010-12-28 LAB — DIFFERENTIAL
Basophils Absolute: 0
Lymphocytes Relative: 12
Monocytes Absolute: 0.8 — ABNORMAL HIGH
Monocytes Relative: 8
Neutro Abs: 8.4 — ABNORMAL HIGH

## 2010-12-28 LAB — CBC
HCT: 44.9
Hemoglobin: 15.3
Hemoglobin: 15.6
MCHC: 34
Platelets: 208
RBC: 4.69
RBC: 4.82
RDW: 13.6
RDW: 13.5
WBC: 10.5

## 2010-12-28 LAB — BASIC METABOLIC PANEL
BUN: 14
BUN: 12
CO2: 24
Calcium: 9.1
Calcium: 9.7
GFR calc non Af Amer: >60
GFR calc non Af Amer: >60
Glucose, Bld: 205 — ABNORMAL HIGH
Glucose, Bld: 196 — ABNORMAL HIGH

## 2010-12-28 LAB — PROTIME-INR: INR: 0.9

## 2010-12-28 LAB — CK TOTAL AND CKMB (NOT AT ARMC)
CK, MB: 1.7
Total CK: 41

## 2010-12-28 LAB — I-STAT 8, (EC8 V) (CONVERTED LAB)
BUN: 15
Bicarbonate: 27.6 — ABNORMAL HIGH
HCT: 48
Hemoglobin: 16.3
Operator id: 196461
Sodium: 136

## 2010-12-28 LAB — COMPREHENSIVE METABOLIC PANEL
ALT: 67 — ABNORMAL HIGH
AST: 41 — ABNORMAL HIGH
Albumin: 4
Alkaline Phosphatase: 88
GFR calc Af Amer: >60
Glucose, Bld: 114 — ABNORMAL HIGH
Potassium: 4.2
Sodium: 137
Total Protein: 6.8

## 2010-12-28 LAB — LIPID PANEL
Cholesterol: 195
HDL: 53
LDL Cholesterol: 84
Total CHOL/HDL Ratio: 3.7
Triglycerides: 290 — ABNORMAL HIGH

## 2010-12-28 LAB — CARDIAC PANEL(CRET KIN+CKTOT+MB+TROPI)
Relative Index: INVALID
Total CK: 52
Troponin I: 0.01

## 2010-12-28 LAB — POCT CARDIAC MARKERS: Myoglobin, poc: 52.5

## 2011-01-03 ENCOUNTER — Ambulatory Visit (INDEPENDENT_AMBULATORY_CARE_PROVIDER_SITE_OTHER): Payer: BC Managed Care – PPO | Admitting: Internal Medicine

## 2011-01-03 ENCOUNTER — Encounter: Payer: Self-pay | Admitting: Internal Medicine

## 2011-01-03 VITALS — BP 122/80 | HR 67 | Temp 98.2°F | Ht 70.0 in | Wt 254.0 lb

## 2011-01-03 DIAGNOSIS — R0609 Other forms of dyspnea: Secondary | ICD-10-CM

## 2011-01-03 DIAGNOSIS — I10 Essential (primary) hypertension: Secondary | ICD-10-CM

## 2011-01-03 DIAGNOSIS — R609 Edema, unspecified: Secondary | ICD-10-CM

## 2011-01-03 DIAGNOSIS — R0600 Dyspnea, unspecified: Secondary | ICD-10-CM | POA: Insufficient documentation

## 2011-01-03 MED ORDER — OLMESARTAN MEDOXOMIL-HCTZ 40-12.5 MG PO TABS: ORAL_TABLET | ORAL | Status: DC

## 2011-01-03 NOTE — Assessment & Plan Note (Addendum)
Symptoms are markedly disproportionate to objective findings and not clear this is a lung problem but pt does appear to have difficult airway management issues plus chf/edema plus obesity effects.   DDX of  difficult airways managment all start with A and  include Adherence, Ace Inhibitors, Acid Reflux, Active Sinus Disease, Alpha 1 Antitripsin deficiency, Anxiety masquerading as Airways dz,  ABPA,  allergy(esp in young), Aspiration (esp in elderly), Adverse effects of DPI,  Active smokers, plus two Bs  = Bronchiectasis and Beta blocker use..and one C= CHF  ACE inhibitors the leading suspect here since this problem dates back to immediate post cabg. And need trial off before returns for full pft's plus amb 02 if still symptomatic.    Acid reflux related to obesity also a concern  Anxiety dx of exclusion  No reason he can't trigger a breath activated switch from a pulmonary perspective

## 2011-01-03 NOTE — Assessment & Plan Note (Signed)

## 2011-01-03 NOTE — Patient Instructions (Addendum)
The actos may be causing breathing and swelling problems  The lisinopril may be causing throat problems and will need to be stopped and replaced benicar 40/12.5  One half pill a day until return for PFT's to see Tammy NP   Late add:  If not improved needs cxr, bnp, cbc and bmet on return

## 2011-01-03 NOTE — Progress Notes (Signed)
  Subjective:    Patient ID: [name] [name], male    DOB: November 16, 1950, 60 y.o.   MRN: [id number]  HPI  29 yowm never smoker with h/o MI on ACEI and c/o sob ever since cabg now unable to blow enough to activate a ignition lock and requesting spirometry to verify   01/03/2011 Initial pulmonary office eval in EMR era  Cc sob x one flight  Ok flat surface but limited by feet from walking more than a grocery store and no handicap parking needed.  Sensation that can't take a breath in. No cough. Sleeping ok without nocturnal  or early am exacerbation  of respiratory  C/o's - symptoms not variable  -  denies any obvious fluctuation of symptoms with weather or environmental changes or other aggravating or alleviating factors except as outlined above.    Review of Systems  Constitutional: Negative for fever, chills, activity change, appetite change and unexpected weight change.  HENT: Negative for congestion, sore throat, rhinorrhea, sneezing, trouble swallowing, dental problem, voice change and postnasal drip.   Eyes: Negative for visual disturbance.  Respiratory: Positive for shortness of breath. Negative for cough and choking.   Cardiovascular: Negative for chest pain and leg swelling.  Gastrointestinal: Negative for nausea, vomiting and abdominal pain.  Genitourinary: Negative for difficulty urinating.  Musculoskeletal: Negative for arthralgias.  Skin: Negative for rash.  Psychiatric/Behavioral: Negative for behavioral problems and confusion.       Objective:   Physical Exam  Obese mod hoarse amb wm nad   Wt 254 01/03/2011  HEENT: nl dentition, turbinates, and orophanx. Nl external ear canals without cough reflex   NECK :  without JVD/Nodes/TM/ nl carotid upstrokes bilaterally   LUNGS: no acc muscle use, clear to A and P bilaterally with prominent pseudowheeze  CV:  RRR  no s3 or murmur or increase in P2, 2+ pitting bilaterally  ABD:  soft and nontender with nl excursion in the  supine position. No bruits or organomegaly, bowel sounds nl  MS:  warm without deformities, calf tenderness, cyanosis or clubbing  SKIN: warm and dry without lesions    NEURO:  alert, approp, no deficits    cxr 09/21/09 Cardiomegaly/post CABG. No active disease or interval change     Assessment & Plan:

## 2011-01-03 NOTE — Assessment & Plan Note (Signed)
?   actos contributing ? Needs to be changed at next dm eval by Dr Sharl Ma

## 2011-01-18 ENCOUNTER — Ambulatory Visit (INDEPENDENT_AMBULATORY_CARE_PROVIDER_SITE_OTHER): Payer: BC Managed Care – PPO | Admitting: *Deleted

## 2011-01-18 DIAGNOSIS — Z7901 Long term (current) use of anticoagulants: Secondary | ICD-10-CM

## 2011-01-18 DIAGNOSIS — I4892 Unspecified atrial flutter: Secondary | ICD-10-CM

## 2011-01-18 LAB — POCT INR: INR: 2.5

## 2011-01-29 ENCOUNTER — Ambulatory Visit (INDEPENDENT_AMBULATORY_CARE_PROVIDER_SITE_OTHER): Payer: BC Managed Care – PPO | Admitting: Adult Health

## 2011-01-29 ENCOUNTER — Ambulatory Visit (INDEPENDENT_AMBULATORY_CARE_PROVIDER_SITE_OTHER): Payer: BC Managed Care – PPO | Admitting: Internal Medicine

## 2011-01-29 ENCOUNTER — Encounter: Payer: Self-pay | Admitting: Adult Health

## 2011-01-29 VITALS — BP 138/78 | HR 66 | Temp 98.1°F | Ht 70.0 in | Wt 244.0 lb

## 2011-01-29 DIAGNOSIS — R0600 Dyspnea, unspecified: Secondary | ICD-10-CM

## 2011-01-29 DIAGNOSIS — R0989 Other specified symptoms and signs involving the circulatory and respiratory systems: Secondary | ICD-10-CM

## 2011-01-29 DIAGNOSIS — R0609 Other forms of dyspnea: Secondary | ICD-10-CM

## 2011-01-29 LAB — PULMONARY FUNCTION TEST

## 2011-01-29 MED ORDER — OLMESARTAN MEDOXOMIL 40 MG PO TABS: 40.0000 mg | ORAL_TABLET | Freq: Every day | ORAL | Status: DC

## 2011-01-29 NOTE — Assessment & Plan Note (Signed)
Dyspnea supsected to be multifactoral in pt-never smoker.  Will once again take off ACE inhibitor -change to benicar 40mg  (avoid diuretic if possible due to gout flare)  Today check cxr and labs.  Reviewed PFT with pt.   Plan:

## 2011-01-29 NOTE — Progress Notes (Signed)
  Subjective:    Patient ID: [name] [name], male    DOB: [birth year], 60 y.o.   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  Sensation that can't take a breath in. No cough. Sleeping ok without nocturnal  or early am exacerbation  of respiratory  C/o's - symptoms not variable  -  denies any obvious fluctuation of symptoms with weather or environmental changes or other aggravating or alleviating factors except as outlined above. >>rec change ACE to Benicar   01/29/2011 Follow up and PFT Returns for follow up and PFT.  Today PFT shows FEV1 1.71l/m (53%), no significant  Change s/p SABA, ratio of 77, DLCO 58% He has paperwork to be completed for DUI for a ignition interlock waiver form.   Last ov pt was changed off ACE to Benicar HCT due to possible upper airway inflammation .  Pt was unable to stay off ACE inhibitor last ov, due to flare of GOUT (combination Benicar HCT) .  We discussed today taking Benicar alone- this is acceptable to the pt.  He says his breathing has not changed at all.      Review of Systems  Constitutional: Negative for fever, chills, activity change, appetite change and unexpected weight change.  HENT: Negative for congestion, sore throat, rhinorrhea, sneezing, trouble swallowing, dental problem, voice change and postnasal drip.   Eyes: Negative for visual disturbance.  Respiratory: Positive for shortness of breath. Negative for cough and choking.   Cardiovascular: Negative for chest pain and leg swelling.  Gastrointestinal: Negative for nausea, vomiting and abdominal pain.  Genitourinary: Negative for difficulty urinating.  Musculoskeletal: Negative  for arthralgias.  Skin: Negative for rash.  Psychiatric/Behavioral: Negative for behavioral problems and confusion.       Objective:   Physical Exam  Obese amb wm nad , morbidly obese  Wt 254 01/03/2011  >>244 01/29/2011   HEENT: nl dentition, turbinates, and orophanx. Nl external ear canals without cough reflex   NECK :  without JVD/Nodes/TM/ nl carotid upstrokes bilaterally   LUNGS: no acc muscle use, clear to A and P bilaterally   CV:  RRR  no s3 or murmur or increase in P2, 1-2+ pitting bilaterally  ABD:  soft and nontender with nl excursion in the supine position. No bruits or organomegaly, bowel sounds nl  MS:  warm without deformities, calf tenderness, cyanosis or clubbing  SKIN: warm and dry without lesions    NEURO:  alert, approp, no deficits    cxr 09/21/09 Cardiomegaly/post CABG. No active disease or interval change     Assessment & Plan:

## 2011-01-29 NOTE — Patient Instructions (Addendum)
Stop Lisinopril .  Begin Benicar 40mg  daily  I will call with labs and xray  follow up Dr. Sherene Sires  In 2 weeks and As needed

## 2011-01-29 NOTE — Progress Notes (Signed)
PFT done today. 

## 2011-02-04 ENCOUNTER — Encounter: Payer: Self-pay | Admitting: Internal Medicine

## 2011-02-06 ENCOUNTER — Telehealth: Payer: Self-pay | Admitting: Internal Medicine

## 2011-02-06 NOTE — Telephone Encounter (Signed)
I have the forms which are completed. Also attached are his spirometry and the full PFT's- the PFT needs to be signed by MW. I will have him sign tomorrow and call the pt when ready.

## 2011-02-07 NOTE — Telephone Encounter (Signed)
Form filled out and signed by TP.  Called spoke with patient, informed him of this.  Pt will pick up forms and copy of 11.12.12 pft and 10.17.12 spirometry on 11.23.12.  Copy of [employer]mv forms made and sent to be scanned into pt's chart.

## 2011-02-07 NOTE — Telephone Encounter (Signed)
Gave forms to JJ to have TP fill out one. He has to have two separate evaluations. Will await TP to fill out other form

## 2011-02-14 ENCOUNTER — Ambulatory Visit: Payer: BC Managed Care – PPO | Admitting: Internal Medicine

## 2011-02-19 ENCOUNTER — Ambulatory Visit (INDEPENDENT_AMBULATORY_CARE_PROVIDER_SITE_OTHER): Payer: BC Managed Care – PPO | Admitting: *Deleted

## 2011-02-19 ENCOUNTER — Telehealth: Payer: Self-pay | Admitting: Adult Health

## 2011-02-19 DIAGNOSIS — I4892 Unspecified atrial flutter: Secondary | ICD-10-CM

## 2011-02-19 DIAGNOSIS — Z7901 Long term (current) use of anticoagulants: Secondary | ICD-10-CM

## 2011-02-19 MED ORDER — OLMESARTAN MEDOXOMIL 40 MG PO TABS: 40.0000 mg | ORAL_TABLET | Freq: Every day | ORAL | Status: DC

## 2011-02-19 NOTE — Telephone Encounter (Signed)
Called and spoke with pt. He states needs 90 day supply of benicar rather than 30. Rx was sent to pharm and he states nothing further needed.

## 2011-03-02 ENCOUNTER — Other Ambulatory Visit: Payer: Self-pay | Admitting: *Deleted

## 2011-03-02 MED ORDER — WARFARIN SODIUM 5 MG PO TABS: 5.0000 mg | ORAL_TABLET | ORAL | Status: DC

## 2011-03-21 ENCOUNTER — Encounter: Payer: BC Managed Care – PPO | Admitting: *Deleted

## 2011-03-26 ENCOUNTER — Encounter (INDEPENDENT_AMBULATORY_CARE_PROVIDER_SITE_OTHER): Payer: BC Managed Care – PPO | Admitting: *Deleted

## 2011-03-26 DIAGNOSIS — I4892 Unspecified atrial flutter: Secondary | ICD-10-CM

## 2011-03-26 DIAGNOSIS — Z7901 Long term (current) use of anticoagulants: Secondary | ICD-10-CM

## 2011-03-26 LAB — POCT INR: INR: 3.5

## 2011-04-15 ENCOUNTER — Emergency Department (HOSPITAL_COMMUNITY): Payer: BC Managed Care – PPO

## 2011-04-15 ENCOUNTER — Emergency Department (HOSPITAL_COMMUNITY)
Admission: EM | Admit: 2011-04-15 | Discharge: 2011-04-16 | Disposition: A | Discharge status: Home / Self Care | Payer: BC Managed Care – PPO | Attending: Emergency Medicine | Admitting: Emergency Medicine

## 2011-04-15 ENCOUNTER — Encounter (HOSPITAL_COMMUNITY): Payer: Self-pay | Admitting: Emergency Medicine

## 2011-04-15 ENCOUNTER — Other Ambulatory Visit: Payer: Self-pay

## 2011-04-15 DIAGNOSIS — Z951 Presence of aortocoronary bypass graft: Secondary | ICD-10-CM | POA: Insufficient documentation

## 2011-04-15 DIAGNOSIS — W19XXXA Unspecified fall, initial encounter: Secondary | ICD-10-CM | POA: Insufficient documentation

## 2011-04-15 DIAGNOSIS — I2510 Atherosclerotic heart disease of native coronary artery without angina pectoris: Secondary | ICD-10-CM | POA: Insufficient documentation

## 2011-04-15 DIAGNOSIS — Z7901 Long term (current) use of anticoagulants: Secondary | ICD-10-CM | POA: Insufficient documentation

## 2011-04-15 DIAGNOSIS — Z9889 Other specified postprocedural states: Secondary | ICD-10-CM | POA: Insufficient documentation

## 2011-04-15 DIAGNOSIS — M549 Dorsalgia, unspecified: Secondary | ICD-10-CM

## 2011-04-15 DIAGNOSIS — E119 Type 2 diabetes mellitus without complications: Secondary | ICD-10-CM | POA: Insufficient documentation

## 2011-04-15 DIAGNOSIS — Z8672 Personal history of thrombophlebitis: Secondary | ICD-10-CM | POA: Insufficient documentation

## 2011-04-15 DIAGNOSIS — M25569 Pain in unspecified knee: Secondary | ICD-10-CM | POA: Insufficient documentation

## 2011-04-15 DIAGNOSIS — R609 Edema, unspecified: Secondary | ICD-10-CM | POA: Insufficient documentation

## 2011-04-15 DIAGNOSIS — E785 Hyperlipidemia, unspecified: Secondary | ICD-10-CM | POA: Insufficient documentation

## 2011-04-15 DIAGNOSIS — IMO0002 Reserved for concepts with insufficient information to code with codable children: Secondary | ICD-10-CM | POA: Insufficient documentation

## 2011-04-15 DIAGNOSIS — I8390 Asymptomatic varicose veins of unspecified lower extremity: Secondary | ICD-10-CM | POA: Insufficient documentation

## 2011-04-15 LAB — DIFFERENTIAL
Lymphs Abs: 0.6 10*3/uL — ABNORMAL LOW (ref 0.7–4.0)
Monocytes Absolute: 0.8 10*3/uL (ref 0.1–1.0)
Monocytes Relative: 9 % (ref 3–12)

## 2011-04-15 LAB — COMPREHENSIVE METABOLIC PANEL
Alkaline Phosphatase: 87 U/L (ref 39–117)
BUN: 22 mg/dL (ref 6–23)
Chloride: 96 mEq/L (ref 96–112)
Creatinine, Ser: 0.86 mg/dL (ref 0.50–1.35)
GFR calc Af Amer: >90 mL/min (ref >90)
GFR calc non Af Amer: >90 mL/min (ref >90)
Glucose, Bld: 104 mg/dL — ABNORMAL HIGH (ref 70–99)
Potassium: 4 mEq/L (ref 3.5–5.1)
Total Bilirubin: 1.4 mg/dL — ABNORMAL HIGH (ref 0.3–1.2)

## 2011-04-15 LAB — CBC
MCHC: 32.6 g/dL (ref 30.0–36.0)
MCV: 107.8 fL — ABNORMAL HIGH (ref 78.0–100.0)
Platelets: 158 10*3/uL (ref 150–400)
RDW: 13.9 % (ref 11.5–15.5)
WBC: 9.8 10*3/uL (ref 4.0–10.5)

## 2011-04-15 LAB — POCT I-STAT TROPONIN I: Troponin i, poc: 0.01 ng/mL (ref 0.00–0.08)

## 2011-04-15 MED ORDER — HYDROMORPHONE HCL PF 1 MG/ML IJ SOLN
0.5000 mg | Freq: Once | INTRAMUSCULAR | Status: AC
  Administered 2011-04-15: 0.5 mg via INTRAVENOUS
  Filled 2011-04-15: qty 1

## 2011-04-15 MED ORDER — HYDROMORPHONE HCL PF 1 MG/ML IJ SOLN
1.0000 mg | Freq: Once | INTRAMUSCULAR | Status: AC
  Administered 2011-04-15: 1 mg via INTRAVENOUS
  Filled 2011-04-15: qty 1

## 2011-04-15 MED ORDER — LORAZEPAM 2 MG/ML IJ SOLN
1.0000 mg | Freq: Once | INTRAMUSCULAR | Status: AC
  Administered 2011-04-15: 1 mg via INTRAVENOUS
  Filled 2011-04-15: qty 1

## 2011-04-15 MED ORDER — OXYCODONE-ACETAMINOPHEN 5-325 MG PO TABS: 1.0000 | ORAL_TABLET | Freq: Four times a day (QID) | ORAL | Status: AC | PRN

## 2011-04-15 MED ORDER — CYCLOBENZAPRINE HCL 10 MG PO TABS: 10.0000 mg | ORAL_TABLET | Freq: Three times a day (TID) | ORAL | Status: AC | PRN

## 2011-04-15 NOTE — ED Notes (Signed)
Patient refusing to get in bed. States he feels more comfortable sitting in chair or standing. Patient told not to stand up without assistance. Went into patient's room with Gaspar Garbe, NT to help patient from wheelchair to chair. On arrival to patient's room patient already standing. Gaspar Garbe helped patient to chair.

## 2011-04-15 NOTE — ED Provider Notes (Addendum)
History   This chart was scribed for [name] Lennert, MD by Sofie Rower. The patient was seen in room APA06/AP[facility]06 and the patient's care was started at 9:02PM.    CSN: [id number]  Arrival date & time 04/15/11  1909   First MD Initiated Contact with Patient 04/15/11 2057      Chief Complaint  Patient presents with  . Back Pain  . Knee Pain    (Consider location/radiation/quality/duration/timing/severity/associated sxs/prior treatment) Patient is a 61 y.o. male presenting with back pain and knee pain. The history is provided by the patient. No language interpreter was used.  Back Pain  This is a recurrent problem. The current episode started more than 2 days ago. The problem occurs constantly. The problem has not changed since onset.The pain is associated with no known injury. The quality of the pain is described as stabbing. The pain is at a severity of 10/10. The pain is severe. The symptoms are aggravated by certain positions (Lying down.). The pain is the same all the time. Pertinent negatives include no chest pain, no fever, no headaches and no abdominal pain.  Knee Pain This is a new problem. The current episode started 12 to 24 hours ago. The problem occurs constantly. The problem has not changed since onset.Pertinent negatives include no chest pain, no abdominal pain and no headaches. The symptoms are aggravated by standing. He has tried nothing for the symptoms.    Past Medical History  Diagnosis Date  . Coronary atherosclerosis of native coronary artery     Multivessel, LVEF 65%  . Superficial thrombophlebitis   . Varicose veins   . Diabetes mellitus, type 2   . Hyperlipidemia   . Chronic back pain   . Essential hypertension, benign   . Atrial fibrillation     Postoperative  . Atrial flutter     Postoperative    Past Surgical History  Procedure Date  . Coronary artery bypass graft 2004    LIMA to LAD, SVG to diagonal and OM, SVG to diagonal and LAD and PDA  .  Cholecystectomy     History reviewed. No pertinent family history.  History  Substance Use Topics  . Smoking status: Never Smoker   . Smokeless tobacco: Never Used  . Alcohol Use: Yes     socially      Review of Systems  Constitutional: Negative for fever and fatigue.  HENT: Negative for congestion, sinus pressure and ear discharge.   Eyes: Negative for discharge.  Respiratory: Negative for cough.   Cardiovascular: Negative for chest pain.  Gastrointestinal: Negative for abdominal pain and diarrhea.  Genitourinary: Negative for frequency and hematuria.  Musculoskeletal: Positive for back pain.  Skin: Negative for rash.  Neurological: Negative for seizures and headaches.  Hematological: Negative.   Psychiatric/Behavioral: Negative for hallucinations.  All other systems reviewed and are negative.    Allergies  Fish oil and Iohexol  Home Medications   Current Outpatient Rx  Name Route Sig Dispense Refill  . FUROSEMIDE 40 MG PO TABS Oral Take 40 mg by mouth daily as needed.      Marland Kitchen METFORMIN HCL 500 MG PO TABS Oral Take 500 mg by mouth 2 (two) times daily.      Marland Kitchen METOPROLOL TARTRATE 50 MG PO TABS Oral Take 1 tablet by mouth Twice daily.    Marland Kitchen OLMESARTAN MEDOXOMIL 40 MG PO TABS Oral Take 1 tablet (40 mg total) by mouth daily. 90 tablet 1  . PIOGLITAZONE HCL 15 MG PO  TABS Oral Take 15 mg by mouth daily.      . WARFARIN SODIUM 5 MG PO TABS Oral Take 1 tablet (5 mg total) by mouth as directed. 45 tablet 2  . NITROGLYCERIN 0.4 MG SL SUBL Sublingual Place 0.4 mg under the tongue as directed.        BP 143/82  Pulse 84  Temp(Src) 98.3 F (36.8 C) (Oral)  Resp 16  Ht 5\' 10"  (1.778 m)  Wt 250 lb (113.399 kg)  BMI 35.87 kg/m2  SpO2 96%  Physical Exam  Nursing note and vitals reviewed. Constitutional: He is oriented to person, place, and time. He appears well-developed.  HENT:  Head: Normocephalic and atraumatic.  Eyes: Conjunctivae and EOM are normal. No scleral  icterus.  Neck: Neck supple. No thyromegaly present.  Cardiovascular: Normal rate and regular rhythm.  Exam reveals no gallop and no friction rub.   No murmur heard. Pulmonary/Chest: No stridor. He has no wheezes. He has no rales. He exhibits no tenderness.  Abdominal: He exhibits no distension. There is no tenderness. There is no rebound.  Musculoskeletal: Normal range of motion. He exhibits edema (2+ in both ankles.) and tenderness (mid thoracic, mild tenderness located in left knee.).       Tender throacic muscles around t5  Lymphadenopathy:    He has no cervical adenopathy.  Neurological: He is alert and oriented to person, place, and time. Coordination normal.       5/5 strength in all ext.  Straight leg raise neg.   Skin: No rash noted. No erythema.  Psychiatric: He has a normal mood and affect. His behavior is normal.    ED Course  Procedures (including critical care time)  DIAGNOSTIC STUDIES: Oxygen Saturation is 96% on room air, adequate by my interpretation.    COORDINATION OF CARE:    Results for orders placed during the hospital encounter of 04/15/11  CBC      Component Value Range   WBC 9.8  4.0 - 10.5 (K/uL)   RBC 4.21 (*) 4.22 - 5.81 (MIL/uL)   Hemoglobin 14.8  13.0 - 17.0 (g/dL)   HCT 16.1  09.6 - 04.5 (%)   MCV 107.8 (*) 78.0 - 100.0 (fL)   MCH 35.2 (*) 26.0 - 34.0 (pg)   MCHC 32.6  30.0 - 36.0 (g/dL)   RDW 40.9  81.1 - 91.4 (%)   Platelets 158  150 - 400 (K/uL)  DIFFERENTIAL      Component Value Range   Neutrophils Relative 84 (*) 43 - 77 (%)   Neutro Abs 8.3 (*) 1.7 - 7.7 (K/uL)   Lymphocytes Relative 6 (*) 12 - 46 (%)   Lymphs Abs 0.6 (*) 0.7 - 4.0 (K/uL)   Monocytes Relative 9  3 - 12 (%)   Monocytes Absolute 0.8  0.1 - 1.0 (K/uL)   Eosinophils Relative 1  0 - 5 (%)   Eosinophils Absolute 0.1  0.0 - 0.7 (K/uL)   Basophils Relative 0  0 - 1 (%)   Basophils Absolute 0.0  0.0 - 0.1 (K/uL)  COMPREHENSIVE METABOLIC PANEL      Component Value Range    Sodium 133 (*) 135 - 145 (mEq/L)   Potassium 4.0  3.5 - 5.1 (mEq/L)   Chloride 96  96 - 112 (mEq/L)   CO2 28  19 - 32 (mEq/L)   Glucose, Bld 104 (*) 70 - 99 (mg/dL)   BUN 22  6 - 23 (mg/dL)   Creatinine, Ser 7.82  0.50 - 1.35 (mg/dL)   Calcium 9.3  8.4 - 82.9 (mg/dL)   Total Protein 6.7  6.0 - 8.3 (g/dL)   Albumin 3.4 (*) 3.5 - 5.2 (g/dL)   AST 21  0 - 37 (U/L)   ALT 36  0 - 53 (U/L)   Alkaline Phosphatase 87  39 - 117 (U/L)   Total Bilirubin 1.4 (*) 0.3 - 1.2 (mg/dL)   GFR calc non Af Amer >90  >90 (mL/min)   GFR calc Af Amer >90  >90 (mL/min)  POCT I-STAT TROPONIN I      Component Value Range   Troponin i, poc 0.01  0.00 - 0.08 (ng/mL)   Comment 3            Ct Chest Wo Contrast  04/15/2011  *RADIOLOGY REPORT*  Clinical Data: Pain post fall, history chronic back pain, coronary disease post CABG, diabetes, hyperlipidemia, hypertension, atrial fibrillation  CT CHEST WITHOUT CONTRAST  Technique:  Multidetector CT imaging of the chest was performed following the standard protocol without IV contrast. Sagittal and coronal MPR images reconstructed from axial data set.  Comparison: None  Findings: Scattered atherosclerotic calcifications aorta and coronary arteries. Postsurgical changes of CABG. No aortic aneurysm identified. Visualized portion of upper abdomen normal appearance. No thoracic adenopathy. Dependent atelectasis in the lower lobes bilaterally.  Scattered streak motion artifacts. Lungs otherwise clear. Generally low lung volumes. No pleural effusion or pneumothorax. Tiny bone island left glenoid. No acute osseous findings.  IMPRESSION: Atherosclerotic disease with postsurgical changes of CABG. Minimal dependent atelectasis and bilateral lower lobes. Otherwise negative exam.  Original Report Authenticated By: Lollie Marrow, M.D.   Dg Knee Complete 4 Views Left  04/15/2011  *RADIOLOGY REPORT*  Clinical Data: Left knee pain post fall  LEFT KNEE - COMPLETE 4+ VIEW  Comparison: None   Findings: Bones appear demineralized. Diffuse soft tissue swelling. Minimal medial compartment joint space narrowing. No acute fracture, dislocation or bone destruction. Well-formed periosteal new bone is seen along the distal left femoral metadiaphysis and questionably at the proximal tibial metaphysis on single view. No knee joint effusion.  IMPRESSION: No acute bony abnormalities. Periosteal bone along the distal femur and questionably at the proximal tibia, compatible with periostitis; differential diagnosis includes hypertrophic osteoarthropathy, venous stasis, hypervitaminosis A, and thyroid acropachy.  Original Report Authenticated By: Lollie Marrow, M.D.         MDM  Back pain. Muscle strain   9:05PM- EDP at bedside discusses treatment plan.  The chart was scribed for me under my direct supervision.  I personally performed the history, physical, and medical decision making and all procedures in the evaluation of this patient..    Date: 04/16/2011  Rate:79  Rhythm: normal sinus rhythm  QRS Axis: normal  Intervals: normal  ST/T Wave abnormalities: nonspecific ST changes  Conduction Disutrbances:none  Narrative Interpretation:   Old EKG Reviewed: none available    [name] Lennert, MD 04/16/11 0001  [name] Lennert, MD 04/16/11 0004

## 2011-04-15 NOTE — ED Notes (Signed)
Ct staff attempted to take patient over for chest ct without contrast. Patient states he doesn't know if he can lay down to have ct done, states his back hurts too much laying down. Patient also states that he wanted to rest a minute and then try to do the ct.

## 2011-04-15 NOTE — ED Notes (Signed)
Patient refusing to let us get ekg until patient receives pain medication. Attempting to find iv placement to administer medication at this time.

## 2011-04-15 NOTE — ED Notes (Signed)
Patient complaining of back pain. States he thinks he has a ruptured disk. States his back pain is his highest concern. Rates pain 10 out 10. Also complaining of left knee and left foot pain. States he fell last night because his foot gave out on him. Swelling noted to both lower extremities. Patient states he started swelling Wednesday. States he takes lasix sometimes for swelling, hasn't taken it recently.

## 2011-04-15 NOTE — ED Notes (Signed)
Patient states he has history of chronic back pain. States his back has been hurting bad since Monday. Also complaining of left knee and left foot pain that he thinks is because of the fall he experienced last night. Denies hitting head during fall. States he think he fell because his feet are swollen. States he takes lasix at times for swelling, but hasn't taken it recently. Called pharmacy to get it refilled but is waiting for doctor to approve the refill.

## 2011-04-15 NOTE — ED Notes (Signed)
Patient reports pain is still a 10 out of 10. Requesting something for nerves before he tries to do the ct.

## 2011-04-15 NOTE — ED Notes (Signed)
Notified dr. Estell Harpin that patient is allergic to ct dye. Dr. Estell Harpin stated to do ct chest without contrast. Notified ct tech of ct without contrast.

## 2011-04-16 ENCOUNTER — Other Ambulatory Visit: Payer: Self-pay | Admitting: Cardiology

## 2011-04-23 ENCOUNTER — Encounter: Payer: BC Managed Care – PPO | Admitting: *Deleted

## 2011-05-08 ENCOUNTER — Other Ambulatory Visit: Payer: Self-pay | Admitting: *Deleted

## 2011-05-08 MED ORDER — METOPROLOL TARTRATE 50 MG PO TABS: 50.0000 mg | ORAL_TABLET | Freq: Two times a day (BID) | ORAL | Status: DC

## 2011-07-06 ENCOUNTER — Other Ambulatory Visit: Payer: Self-pay | Admitting: *Deleted

## 2011-07-06 MED ORDER — NITROGLYCERIN 0.4 MG SL SUBL: 0.4000 mg | SUBLINGUAL_TABLET | SUBLINGUAL | Status: DC

## 2011-07-28 ENCOUNTER — Emergency Department (HOSPITAL_COMMUNITY)
Admission: EM | Admit: 2011-07-28 | Discharge: 2011-07-29 | Disposition: A | Discharge status: Home / Self Care | Payer: BC Managed Care – PPO | Attending: Emergency Medicine | Admitting: Emergency Medicine

## 2011-07-28 ENCOUNTER — Encounter (HOSPITAL_COMMUNITY): Payer: Self-pay | Admitting: Emergency Medicine

## 2011-07-28 DIAGNOSIS — E785 Hyperlipidemia, unspecified: Secondary | ICD-10-CM | POA: Insufficient documentation

## 2011-07-28 DIAGNOSIS — M109 Gout, unspecified: Secondary | ICD-10-CM | POA: Insufficient documentation

## 2011-07-28 DIAGNOSIS — Z79899 Other long term (current) drug therapy: Secondary | ICD-10-CM | POA: Insufficient documentation

## 2011-07-28 DIAGNOSIS — Z951 Presence of aortocoronary bypass graft: Secondary | ICD-10-CM | POA: Insufficient documentation

## 2011-07-28 DIAGNOSIS — I8390 Asymptomatic varicose veins of unspecified lower extremity: Secondary | ICD-10-CM | POA: Insufficient documentation

## 2011-07-28 DIAGNOSIS — I10 Essential (primary) hypertension: Secondary | ICD-10-CM | POA: Insufficient documentation

## 2011-07-28 DIAGNOSIS — Z7901 Long term (current) use of anticoagulants: Secondary | ICD-10-CM | POA: Insufficient documentation

## 2011-07-28 DIAGNOSIS — E119 Type 2 diabetes mellitus without complications: Secondary | ICD-10-CM | POA: Insufficient documentation

## 2011-07-28 DIAGNOSIS — I2510 Atherosclerotic heart disease of native coronary artery without angina pectoris: Secondary | ICD-10-CM | POA: Insufficient documentation

## 2011-07-28 NOTE — ED Notes (Signed)
PT. REPORTS PROGRESSING BILATERAL FEET SWELLING - MORE ON THE RIGHT FOOT WITH PAIN ONSET YESTERDAY , DENIES INJURY.

## 2011-07-29 ENCOUNTER — Emergency Department (HOSPITAL_COMMUNITY): Payer: BC Managed Care – PPO

## 2011-07-29 LAB — DIFFERENTIAL
Basophils Absolute: 0 10*3/uL (ref 0.0–0.1)
Basophils Relative: 0 % (ref 0–1)
Eosinophils Absolute: 0.1 10*3/uL (ref 0.0–0.7)
Monocytes Relative: 9 % (ref 3–12)
Neutrophils Relative %: 81 % — ABNORMAL HIGH (ref 43–77)

## 2011-07-29 LAB — PROTIME-INR: Prothrombin Time: 14.9 seconds (ref 11.6–15.2)

## 2011-07-29 LAB — CBC
MCH: 36.4 pg — ABNORMAL HIGH (ref 26.0–34.0)
MCHC: 34.1 g/dL (ref 30.0–36.0)
Platelets: 189 10*3/uL (ref 150–400)
RDW: 14.3 % (ref 11.5–15.5)

## 2011-07-29 LAB — BASIC METABOLIC PANEL
BUN: 14 mg/dL (ref 6–23)
GFR calc Af Amer: >90 mL/min (ref >90)
GFR calc non Af Amer: 86 mL/min — ABNORMAL LOW (ref >90)
Potassium: 3.8 mEq/L (ref 3.5–5.1)

## 2011-07-29 MED ORDER — IBUPROFEN 600 MG PO TABS: 600.0000 mg | ORAL_TABLET | Freq: Three times a day (TID) | ORAL | Status: AC | PRN

## 2011-07-29 MED ORDER — OXYCODONE-ACETAMINOPHEN 5-325 MG PO TABS: 1.0000 | ORAL_TABLET | ORAL | Status: AC | PRN

## 2011-07-29 MED ORDER — HYDROMORPHONE HCL PF 2 MG/ML IJ SOLN
2.0000 mg | Freq: Once | INTRAMUSCULAR | Status: AC
  Administered 2011-07-29: 2 mg via INTRAMUSCULAR
  Filled 2011-07-29: qty 1

## 2011-07-29 NOTE — ED Provider Notes (Signed)
History     CSN: [id number]  Arrival date & time 07/28/11  2339   First MD Initiated Contact with Patient 07/29/11 0050      Chief Complaint  Patient presents with  . Leg Swelling     HPI The patient reports pain in his right great toe which began this morning and worsened as the evening progressed.  His pain is moderate to severe at this time.  He denies erythema.  His had no recent trauma to the area.  He reports chronic lower extremity edema which is currently at baseline.  He denies fevers or chills.  He reports he has a history of gout but reports this feels similar to his catheters have 1 the past except it is more severe in terms of severity of the pain.  Is worsened by movement walking and palpation.  It is improved by rest the   Past Medical History  Diagnosis Date  . Coronary atherosclerosis of native coronary artery     Multivessel, LVEF 65%  . Superficial thrombophlebitis   . Varicose veins   . Diabetes mellitus, type 2   . Hyperlipidemia   . Chronic back pain   . Essential hypertension, benign   . Atrial fibrillation     Postoperative  . Atrial flutter     Postoperative    Past Surgical History  Procedure Date  . Coronary artery bypass graft 2004    LIMA to LAD, SVG to diagonal and OM, SVG to diagonal and LAD and PDA  . Cholecystectomy     No family history on file.  History  Substance Use Topics  . Smoking status: Never Smoker   . Smokeless tobacco: Never Used  . Alcohol Use: Yes     socially      Review of Systems  All other systems reviewed and are negative.    Allergies  Fish oil and Iohexol  Home Medications   Current Outpatient Rx  Name Route Sig Dispense Refill  . FUROSEMIDE 40 MG PO TABS Oral Take 40 mg by mouth daily as needed. For fluid retention.    Marland Kitchen METFORMIN HCL 500 MG PO TABS Oral Take 500 mg by mouth 2 (two) times daily.      Marland Kitchen METOPROLOL TARTRATE 50 MG PO TABS Oral Take 50 mg by mouth 2 (two) times daily.    Marland Kitchen  NITROGLYCERIN 0.4 MG SL SUBL Sublingual Place 0.4 mg under the tongue every 5 (five) minutes as needed. For chest pains.    Marland Kitchen OLMESARTAN MEDOXOMIL 40 MG PO TABS Oral Take 40 mg by mouth daily.    Marland Kitchen PIOGLITAZONE HCL 15 MG PO TABS Oral Take 15 mg by mouth daily.      . WARFARIN SODIUM 5 MG PO TABS Oral Take 2.5-5 mg by mouth daily. Take 5mg  daily, except take 2.5mg  on Tuesdays.    . IBUPROFEN 600 MG PO TABS Oral Take 1 tablet (600 mg total) by mouth every 8 (eight) hours as needed for pain. 15 tablet 0  . OXYCODONE-ACETAMINOPHEN 5-325 MG PO TABS Oral Take 1-2 tablets by mouth every 4 (four) hours as needed for pain. 25 tablet 0    BP 163/80  Pulse 94  Temp(Src) 98.5 F (36.9 C) (Oral)  Resp 16  SpO2 99%  Physical Exam  Constitutional: He is oriented to person, place, and time. He appears well-developed and well-nourished.  HENT:  Head: Normocephalic.  Eyes: EOM are normal.  Neck: Normal range of motion.  Pulmonary/Chest: Effort  normal.  Musculoskeletal: Normal range of motion.        tenderness is distal aspect of his right distal first metatarsal.  There Is no swelling of his MTP joint on this toe.  Also with tenderness along the length of his right first metatarsal.  No tenderness of his medial or lateral malleolus.  There is no erythema of his lower extremities.  He is normal PT and DP pulses bilaterally.  He has signs of chronic venous insufficiency with 1+ pitting edema bilaterally and brawny change to his lower extremities which he reports is baseline for him  Neurological: He is alert and oriented to person, place, and time.  Skin: Skin is warm and dry.  Psychiatric: He has a normal mood and affect.    ED Course  Procedures (including critical care time)  Labs Reviewed  CBC - Abnormal; Notable for the following:    RBC 3.98 (*)    MCV 106.8 (*)    MCH 36.4 (*)    All other components within normal limits  DIFFERENTIAL - Abnormal; Notable for the following:    Neutrophils  Relative 81 (*)    Lymphocytes Relative 9 (*)    All other components within normal limits  BASIC METABOLIC PANEL - Abnormal; Notable for the following:    Glucose, Bld 114 (*)    GFR calc non Af Amer 86 (*)    All other components within normal limits  PROTIME-INR   Dg Foot Complete Right  07/29/2011  *RADIOLOGY REPORT*  Clinical Data: Right foot pain and swelling for 1 day.  History of diabetes and gout.  RIGHT FOOT COMPLETE - 3+ VIEW  Comparison: None.  Findings: Diffuse bone demineralization.  Suggestion of early lucency in the medial aspect of the distal right first metatarsal bone may represent early erosive changes of gout.  Degenerative changes also present at the first metatarsophalangeal joint.  No evidence of acute fracture or subluxation.  No focal bone lesion. Small plantar and Achilles calcaneal spurs.  No radiopaque soft tissue foreign bodies.  IMPRESSION: Suggestion of early erosive changes of gout in the first metatarsophalangeal joint.  Degenerative changes also present.  No acute fracture.  Original Report Authenticated By: Marlon Pel, M.D.     1. Gout       MDM   I suspect this is gout.  The patient had labs obtained prior to my evaluation per nursing protocol.  Plain films be obtained given point tenderness.  Normal pulses.  Will treat his pain at this time.  There is no signs of suggest septic arthritis.  The patient has a history of gout.inr pending  2:29 AM The pt feels better at this time. Dc home        Lyanne Co, MD 07/29/11 [id number]

## 2011-07-29 NOTE — Discharge Instructions (Signed)
Gout Gout is an inflammatory condition (arthritis) caused by a buildup of uric acid crystals in the joints. Uric acid is a chemical that is normally present in the blood. Under some circumstances, uric acid can form into crystals in your joints. This causes joint redness, soreness, and swelling (inflammation). Repeat attacks are common. Over time, uric acid crystals can form into masses (tophi) near a joint, causing disfigurement. Gout is treatable and often preventable. CAUSES  The disease begins with elevated levels of uric acid in the blood. Uric acid is produced by your body when it breaks down a naturally found substance called purines. This also happens when you eat certain foods such as meats and fish. Causes of an elevated uric acid level include:  Being passed down from parent to child (heredity).   Diseases that cause increased uric acid production (obesity, psoriasis, some cancers).   Excessive alcohol use.   Diet, especially diets rich in meat and seafood.   Medicines, including certain cancer-fighting drugs (chemotherapy), diuretics, and aspirin.   Chronic kidney disease. The kidneys are no longer able to remove uric acid well.   Problems with metabolism.  Conditions strongly associated with gout include:  Obesity.   High blood pressure.   High cholesterol.   Diabetes.  Not everyone with elevated uric acid levels gets gout. It is not understood why some people get gout and others do not. Surgery, joint injury, and eating too much of certain foods are some of the factors that can lead to gout. SYMPTOMS   An attack of gout comes on quickly. It causes intense pain with redness, swelling, and warmth in a joint.   Fever can occur.   Often, only one joint is involved. Certain joints are more commonly involved:   Base of the big toe.   Knee.   Ankle.   Wrist.   Finger.  Without treatment, an attack usually goes away in a few days to weeks. Between attacks, you  usually will not have symptoms, which is different from many other forms of arthritis. DIAGNOSIS  Your caregiver will suspect gout based on your symptoms and exam. Removal of fluid from the joint (arthrocentesis) is done to check for uric acid crystals. Your caregiver will give you a medicine that numbs the area (local anesthetic) and use a needle to remove joint fluid for exam. Gout is confirmed when uric acid crystals are seen in joint fluid, using a special microscope. Sometimes, blood, urine, and X-ray tests are also used. TREATMENT  There are 2 phases to gout treatment: treating the sudden onset (acute) attack and preventing attacks (prophylaxis). Treatment of an Acute Attack  Medicines are used. These include anti-inflammatory medicines or steroid medicines.   An injection of steroid medicine into the affected joint is sometimes necessary.   The painful joint is rested. Movement can worsen the arthritis.   You may use warm or cold treatments on painful joints, depending which works best for you.   Discuss the use of coffee, vitamin C, or cherries with your caregiver. These may be helpful treatment options.  Treatment to Prevent Attacks After the acute attack subsides, your caregiver may advise prophylactic medicine. These medicines either help your kidneys eliminate uric acid from your body or decrease your uric acid production. You may need to stay on these medicines for a very long time. The early phase of treatment with prophylactic medicine can be associated with an increase in acute gout attacks. For this reason, during the first few months   of treatment, your caregiver may also advise you to take medicines usually used for acute gout treatment. Be sure you understand your caregiver's directions. You should also discuss dietary treatment with your caregiver. Certain foods such as meats and fish can increase uric acid levels. Other foods such as dairy can decrease levels. Your caregiver  can give you a list of foods to avoid. HOME CARE INSTRUCTIONS   Do not take aspirin to relieve pain. This raises uric acid levels.   Only take over-the-counter or prescription medicines for pain, discomfort, or fever as directed by your caregiver.   Rest the joint as much as possible. When in bed, keep sheets and blankets off painful areas.   Keep the affected joint raised (elevated).   Use crutches if the painful joint is in your leg.   Drink enough water and fluids to keep your urine clear or pale yellow. This helps your body get rid of uric acid. Do not drink alcoholic beverages. They slow the passage of uric acid.   Follow your caregiver's dietary instructions. Pay careful attention to the amount of protein you eat. Your daily diet should emphasize fruits, vegetables, whole grains, and fat-free or low-fat milk products.   Maintain a healthy body weight.  SEEK MEDICAL CARE IF:   You have an oral temperature above 102 F (38.9 C).   You develop diarrhea, vomiting, or any side effects from medicines.   You do not feel better in 24 hours, or you are getting worse.  SEEK IMMEDIATE MEDICAL CARE IF:   Your joint becomes suddenly more tender and you have:   Chills.   An oral temperature above 102 F (38.9 C), not controlled by medicine.  MAKE SURE YOU:   Understand these instructions.   Will watch your condition.   Will get help right away if you are not doing well or get worse.  Document Released: 03/02/2000 Document Revised: 02/22/2011 Document Reviewed: 06/13/2009 [employer] Patient Information 2012 [employer]. 

## 2011-07-29 NOTE — ED Notes (Signed)
Pt states he has a history of Gout and the pain feels similar to Gout. Patient is alertx4, NAD at this time.

## 2011-08-26 ENCOUNTER — Other Ambulatory Visit: Payer: Self-pay | Admitting: Internal Medicine

## 2011-10-12 ENCOUNTER — Other Ambulatory Visit: Payer: Self-pay | Admitting: *Deleted

## 2011-10-12 MED ORDER — WARFARIN SODIUM 5 MG PO TABS: 5.0000 mg | ORAL_TABLET | ORAL | Status: DC

## 2011-10-12 NOTE — Telephone Encounter (Signed)
SPOKE WITH PATIENT AND INFORMED HIM THAT HE NEEDED F/U APPT WITH LISA. PATIENT STATED HE WOULD CALL [facility] TO SCHEDULE F/U WITH LISA.

## 2011-10-15 ENCOUNTER — Ambulatory Visit (INDEPENDENT_AMBULATORY_CARE_PROVIDER_SITE_OTHER): Payer: BC Managed Care – PPO | Admitting: *Deleted

## 2011-10-15 DIAGNOSIS — Z7901 Long term (current) use of anticoagulants: Secondary | ICD-10-CM

## 2011-10-15 DIAGNOSIS — I4892 Unspecified atrial flutter: Secondary | ICD-10-CM

## 2011-10-15 LAB — POCT INR: INR: 1.8

## 2011-11-05 ENCOUNTER — Ambulatory Visit (INDEPENDENT_AMBULATORY_CARE_PROVIDER_SITE_OTHER): Payer: BC Managed Care – PPO | Admitting: *Deleted

## 2011-11-05 DIAGNOSIS — Z7901 Long term (current) use of anticoagulants: Secondary | ICD-10-CM

## 2011-11-05 DIAGNOSIS — I4892 Unspecified atrial flutter: Secondary | ICD-10-CM

## 2011-11-05 MED ORDER — WARFARIN SODIUM 5 MG PO TABS: 5.0000 mg | ORAL_TABLET | ORAL | Status: DC

## 2011-11-05 MED ORDER — METOPROLOL TARTRATE 50 MG PO TABS: 50.0000 mg | ORAL_TABLET | Freq: Two times a day (BID) | ORAL | Status: DC

## 2011-11-05 MED ORDER — LISINOPRIL 20 MG PO TABS: 20.0000 mg | ORAL_TABLET | Freq: Every day | ORAL | Status: DC

## 2011-11-22 ENCOUNTER — Ambulatory Visit (INDEPENDENT_AMBULATORY_CARE_PROVIDER_SITE_OTHER): Payer: BC Managed Care – PPO | Admitting: *Deleted

## 2011-11-22 DIAGNOSIS — I4892 Unspecified atrial flutter: Secondary | ICD-10-CM

## 2011-11-22 DIAGNOSIS — Z7901 Long term (current) use of anticoagulants: Secondary | ICD-10-CM

## 2011-12-12 ENCOUNTER — Ambulatory Visit (INDEPENDENT_AMBULATORY_CARE_PROVIDER_SITE_OTHER): Payer: BC Managed Care – PPO | Admitting: *Deleted

## 2011-12-12 DIAGNOSIS — Z7901 Long term (current) use of anticoagulants: Secondary | ICD-10-CM

## 2011-12-12 DIAGNOSIS — I4892 Unspecified atrial flutter: Secondary | ICD-10-CM

## 2011-12-12 LAB — POCT INR: INR: 1.9

## 2011-12-21 ENCOUNTER — Other Ambulatory Visit: Payer: Self-pay | Admitting: *Deleted

## 2011-12-21 MED ORDER — WARFARIN SODIUM 5 MG PO TABS: 5.0000 mg | ORAL_TABLET | ORAL | Status: DC

## 2011-12-24 ENCOUNTER — Other Ambulatory Visit: Payer: Self-pay | Admitting: *Deleted

## 2012-01-09 ENCOUNTER — Ambulatory Visit (INDEPENDENT_AMBULATORY_CARE_PROVIDER_SITE_OTHER): Payer: BC Managed Care – PPO | Admitting: *Deleted

## 2012-01-09 DIAGNOSIS — I4892 Unspecified atrial flutter: Secondary | ICD-10-CM

## 2012-01-09 DIAGNOSIS — Z7901 Long term (current) use of anticoagulants: Secondary | ICD-10-CM

## 2012-01-09 LAB — POCT INR: INR: 2.1

## 2012-02-13 ENCOUNTER — Ambulatory Visit (INDEPENDENT_AMBULATORY_CARE_PROVIDER_SITE_OTHER): Payer: BC Managed Care – PPO | Admitting: *Deleted

## 2012-02-13 DIAGNOSIS — Z7901 Long term (current) use of anticoagulants: Secondary | ICD-10-CM

## 2012-02-13 DIAGNOSIS — I4892 Unspecified atrial flutter: Secondary | ICD-10-CM

## 2012-03-26 ENCOUNTER — Ambulatory Visit (INDEPENDENT_AMBULATORY_CARE_PROVIDER_SITE_OTHER): Payer: BC Managed Care – PPO | Admitting: *Deleted

## 2012-03-26 DIAGNOSIS — I4892 Unspecified atrial flutter: Secondary | ICD-10-CM

## 2012-03-26 DIAGNOSIS — Z7901 Long term (current) use of anticoagulants: Secondary | ICD-10-CM

## 2012-05-12 ENCOUNTER — Ambulatory Visit (INDEPENDENT_AMBULATORY_CARE_PROVIDER_SITE_OTHER): Payer: BC Managed Care – PPO | Admitting: *Deleted

## 2012-05-12 DIAGNOSIS — I4892 Unspecified atrial flutter: Secondary | ICD-10-CM

## 2012-05-12 LAB — POCT INR: INR: 3.1

## 2012-05-18 ENCOUNTER — Other Ambulatory Visit: Payer: Self-pay | Admitting: Cardiology

## 2012-05-19 ENCOUNTER — Other Ambulatory Visit: Payer: Self-pay | Admitting: Cardiology

## 2012-05-20 ENCOUNTER — Telehealth: Payer: Self-pay | Admitting: *Deleted

## 2012-05-20 NOTE — Telephone Encounter (Signed)
Noted pt has not been seen since 11-23-10, pt notified per refill request that he will need to be seen in order to continue to receive medications, pt understood however pt needs early apt and due to MD SM booked out schedule pt earliest available noted as 07-18-12 at 8:20am, pt accepted and will contact office if any concerns arise prior to OV.

## 2012-06-09 ENCOUNTER — Ambulatory Visit (INDEPENDENT_AMBULATORY_CARE_PROVIDER_SITE_OTHER): Payer: BC Managed Care – PPO | Admitting: *Deleted

## 2012-06-09 DIAGNOSIS — I4892 Unspecified atrial flutter: Secondary | ICD-10-CM

## 2012-06-09 DIAGNOSIS — Z7901 Long term (current) use of anticoagulants: Secondary | ICD-10-CM

## 2012-06-27 ENCOUNTER — Other Ambulatory Visit: Payer: Self-pay | Admitting: Cardiology

## 2012-07-07 ENCOUNTER — Ambulatory Visit (INDEPENDENT_AMBULATORY_CARE_PROVIDER_SITE_OTHER): Payer: BC Managed Care – PPO | Admitting: *Deleted

## 2012-07-07 DIAGNOSIS — Z7901 Long term (current) use of anticoagulants: Secondary | ICD-10-CM

## 2012-07-07 DIAGNOSIS — I4892 Unspecified atrial flutter: Secondary | ICD-10-CM

## 2012-07-18 ENCOUNTER — Ambulatory Visit (INDEPENDENT_AMBULATORY_CARE_PROVIDER_SITE_OTHER): Payer: BC Managed Care – PPO | Admitting: Cardiology

## 2012-07-18 ENCOUNTER — Encounter: Payer: Self-pay | Admitting: Cardiology

## 2012-07-18 VITALS — BP 162/95 | HR 72 | Ht 70.0 in | Wt 261.0 lb

## 2012-07-18 DIAGNOSIS — I10 Essential (primary) hypertension: Secondary | ICD-10-CM

## 2012-07-18 DIAGNOSIS — E782 Mixed hyperlipidemia: Secondary | ICD-10-CM

## 2012-07-18 DIAGNOSIS — I2510 Atherosclerotic heart disease of native coronary artery without angina pectoris: Secondary | ICD-10-CM

## 2012-07-18 DIAGNOSIS — I4892 Unspecified atrial flutter: Secondary | ICD-10-CM

## 2012-07-18 NOTE — Assessment & Plan Note (Signed)
Symptomatically stable on medical therapy. ECG shows nonspecific ST changes. We discussed diet, possible avenues for aerobic exercise such as a recumbent or stationary bicycle. He does need to lose weight as this will help with a number of his comorbidities. Medical therapy will be continued and more regular followup arranged.

## 2012-07-18 NOTE — Patient Instructions (Addendum)
Your physician recommends that you schedule a follow-up appointment in: 6 months  Your physician has requested that you regularly monitor and record your blood pressure readings at home. Please use the same machine at the same time of day to check your readings and record them to bring to your follow-up visit.

## 2012-07-18 NOTE — Assessment & Plan Note (Signed)
The patient states that labs are checked by Dr. Sharl Ma. Optimally his LDL should be under 100, close to 70 if possible. Would consider statin to help achieve this with documented CAD and diabetes.

## 2012-07-18 NOTE — Assessment & Plan Note (Signed)
No obvious symptomatic recurrences, in sinus rhythm today. He continues on Coumadin.

## 2012-07-18 NOTE — Progress Notes (Signed)
Clinical Summary [name] [name] is a 62 y.o.male last seen in September 2012. He reports regular followup with his endocrinologist. He states that he has not been active, reports problems with chronic back pain that limit his ability to exercise. He remains significantly overweight, states that he has had difficulty controlling his blood sugars. Fortunately, he has had no angina symptoms.  ECG today shows sinus rhythm with NSST changes.  He reports lipid followup with Dr. Sharl Ma. He is not on a statin at this point. Blood pressure significantly elevated today, somewhat less when I rechecked it. He states that he took his medicines just before coming in today. We discussed sodium restriction, diet, also possible exercise using a recumbent or stationary bicycle.  He reports no palpitations, has had no bleeding problems with Coumadin.   Allergies  Allergen Reactions  . Fish Oil Nausea And Vomiting  . Iohexol      Desc: THROAT SWELLING-REQUIRES 13 HR PREP     Current Outpatient Prescriptions  Medication Sig Dispense Refill  . furosemide (LASIX) 40 MG tablet Take 40 mg by mouth daily as needed. For fluid retention.      Marland Kitchen lisinopril (PRINIVIL,ZESTRIL) 20 MG tablet TAKE 1 TABLET BY MOUTH EVERY DAY  90 tablet  1  . metFORMIN (GLUCOPHAGE) 500 MG tablet Take 500 mg by mouth 2 (two) times daily.        . metoprolol (LOPRESSOR) 50 MG tablet TAKE 1 TABLET BY MOUTH TWICE A DAY  60 tablet  0  . nitroGLYCERIN (NITROSTAT) 0.4 MG SL tablet Place 0.4 mg under the tongue every 5 (five) minutes as needed. For chest pains.      . pioglitazone (ACTOS) 15 MG tablet Take 15 mg by mouth daily.        Marland Kitchen warfarin (COUMADIN) 5 MG tablet Take 1 tablet (5 mg total) by mouth as directed. PATIENT INFORMED TO CALL OFFICE TO SCHEDULE OV WITH LISA ASAP  90 tablet  1   No current facility-administered medications for this visit.    Past Medical History  Diagnosis Date  . Coronary atherosclerosis of native coronary artery      Multivessel, LVEF 65%  . Superficial thrombophlebitis   . Varicose veins   . Diabetes mellitus, type 2   . Hyperlipidemia   . Chronic back pain   . Essential hypertension, benign   . Atrial fibrillation     Postoperative  . Atrial flutter     Postoperative    Past Surgical History  Procedure Laterality Date  . Coronary artery bypass graft  2004    LIMA to LAD, SVG to diagonal and OM, SVG to diagonal and LAD and PDA  . Cholecystectomy      Social History [name] [name] reports that he has never smoked. He has never used smokeless tobacco. [name] [name] reports that  drinks alcohol.  Review of Systems No orthopnea or PND. No leg edema. Otherwise negative except as outlined.  Physical Examination Filed Vitals:   07/18/12 0933  BP: 162/95  Pulse: 72   Filed Weights   07/18/12 0831  Weight: 261 lb 0.6 oz (118.407 kg)    Obese male in no acute distress.  HEENT: Conjunctiva and lids normal, oropharynx with moist mucosa.  Neck: Supple, no elevated JVP or carotid bruits, no thyromegaly.  Lungs: Clear to auscultation, nonlabored.  Cardiac: Regular rate and rhythm, no S3.  Abdomen: Soft, nontender, bowel sounds present.  Skin: Warm and dry, somewhat ruddy complexion.  Extremities: Trace ankle  edema, distal pulses 1-2+.  Musculoskeletal: No kyphosis.  Neuropsychiatric: Alert and oriented x3, affect grossly appropriate.   Problem List and Plan   CAD, NATIVE VESSEL Symptomatically stable on medical therapy. ECG shows nonspecific ST changes. We discussed diet, possible avenues for aerobic exercise such as a recumbent or stationary bicycle. He does need to lose weight as this will help with a number of his comorbidities. Medical therapy will be continued and more regular followup arranged.  Essential hypertension, benign Significantly elevated today. Patient has not been checking it regularly, states that he used to have a blood pressure cuff but that it no longer works. I asked  him to get a new one. Have also discussed medications, sodium restriction and diet.  Mixed hyperlipidemia The patient states that labs are checked by Dr. Sharl Ma. Optimally his LDL should be under 100, close to 70 if possible. Would consider statin to help achieve this with documented CAD and diabetes.  ATRIAL FLUTTER No obvious symptomatic recurrences, in sinus rhythm today. He continues on Coumadin.     Jonelle Sidle, M.D., F.A.C.C.

## 2012-07-18 NOTE — Assessment & Plan Note (Signed)
Significantly elevated today. Patient has not been checking it regularly, states that he used to have a blood pressure cuff but that it no longer works. I asked him to get a new one. Have also discussed medications, sodium restriction and diet.

## 2012-07-26 ENCOUNTER — Other Ambulatory Visit: Payer: Self-pay | Admitting: Cardiology

## 2012-07-29 NOTE — Telephone Encounter (Signed)
Please refill.

## 2012-07-30 ENCOUNTER — Telehealth: Payer: Self-pay | Admitting: *Deleted

## 2012-07-30 ENCOUNTER — Other Ambulatory Visit: Payer: Self-pay | Admitting: *Deleted

## 2012-07-30 DIAGNOSIS — I10 Essential (primary) hypertension: Secondary | ICD-10-CM

## 2012-07-30 MED ORDER — WARFARIN SODIUM 5 MG PO TABS: 5.0000 mg | ORAL_TABLET | ORAL | Status: DC

## 2012-07-30 NOTE — Telephone Encounter (Signed)
Pt has noted the elevation 2-3 daily for a week and noted the top number no lower than 172, pt also noted the bottom number was also elevated but now notes in the range of 75-85 over the past few days, pt denies chest pain/SOB, has noted some swelling in his feet but notes this is normal per sleeping a lot without elevation, taking his lasix recently and swelling has decreased also over the past week, started taking home BP as directed at last OV 07-18-12, was advised to monitor and call office in case medication adjustment needs to be made, please advise

## 2012-07-30 NOTE — Telephone Encounter (Signed)
PT STATES HE HAS NOT BEEN ABLE TO GET HIS TOP BP NUMBER UNDER 172.TMJ

## 2012-07-30 NOTE — Telephone Encounter (Signed)
Increase Lisinopril to 40 mg daily and check BMET in 2 weeks.

## 2012-07-31 NOTE — Telephone Encounter (Signed)
.  left message to have patient return my call.  

## 2012-08-01 MED ORDER — LISINOPRIL 40 MG PO TABS: 40.0000 mg | ORAL_TABLET | Freq: Every day | ORAL | Status: DC

## 2012-08-01 NOTE — Telephone Encounter (Signed)
Spoke to pt to advise results/instructions. Pt understood. Placed new RX for 90 day supply per pt request, placed lab orders in chart, pt will have drawn in 2 weeks

## 2012-08-07 ENCOUNTER — Telehealth: Payer: Self-pay | Admitting: *Deleted

## 2012-08-07 NOTE — Telephone Encounter (Signed)
Called pt and offered him an INR appt for tomorrow in [place].  States he does not have transportation and will call me back tomorrow to work out a time for next week.

## 2012-08-07 NOTE — Telephone Encounter (Signed)
Patient is calling to touch base with you regarding his last med check.  Please give him a call at [phone]

## 2012-08-14 ENCOUNTER — Ambulatory Visit (INDEPENDENT_AMBULATORY_CARE_PROVIDER_SITE_OTHER): Payer: BC Managed Care – PPO | Admitting: *Deleted

## 2012-08-14 DIAGNOSIS — I4892 Unspecified atrial flutter: Secondary | ICD-10-CM

## 2012-08-14 DIAGNOSIS — Z7901 Long term (current) use of anticoagulants: Secondary | ICD-10-CM

## 2012-08-14 LAB — POCT INR: INR: 5.5

## 2012-08-15 ENCOUNTER — Other Ambulatory Visit: Payer: Self-pay | Admitting: Cardiology

## 2012-08-21 ENCOUNTER — Ambulatory Visit (INDEPENDENT_AMBULATORY_CARE_PROVIDER_SITE_OTHER): Payer: BC Managed Care – PPO | Admitting: *Deleted

## 2012-08-21 DIAGNOSIS — I4892 Unspecified atrial flutter: Secondary | ICD-10-CM

## 2012-08-21 DIAGNOSIS — Z7901 Long term (current) use of anticoagulants: Secondary | ICD-10-CM

## 2012-08-21 LAB — POCT INR: INR: 4

## 2012-08-22 LAB — BASIC METABOLIC PANEL
Chloride: 103 mEq/L (ref 96–112)
Potassium: 4.3 mEq/L (ref 3.5–5.3)
Sodium: 142 mEq/L (ref 135–145)

## 2012-08-27 ENCOUNTER — Telehealth: Payer: Self-pay | Admitting: *Deleted

## 2012-08-27 NOTE — Telephone Encounter (Signed)
Patient informed and states his BP has came down to normal.

## 2012-08-27 NOTE — Telephone Encounter (Signed)
Message copied by Eustace Moore on Wed Aug 27, 2012  4:22 PM ------      Message from: Jonelle Sidle      Created: Sun Aug 24, 2012  7:44 AM       Renal function and potassium normal after recent medication increase. ------

## 2012-09-11 ENCOUNTER — Ambulatory Visit (INDEPENDENT_AMBULATORY_CARE_PROVIDER_SITE_OTHER): Payer: BC Managed Care – PPO | Admitting: *Deleted

## 2012-09-11 DIAGNOSIS — Z7901 Long term (current) use of anticoagulants: Secondary | ICD-10-CM

## 2012-09-11 DIAGNOSIS — I4892 Unspecified atrial flutter: Secondary | ICD-10-CM

## 2012-09-11 LAB — POCT INR: INR: 2.3

## 2012-09-29 ENCOUNTER — Ambulatory Visit (INDEPENDENT_AMBULATORY_CARE_PROVIDER_SITE_OTHER): Payer: BC Managed Care – PPO | Admitting: *Deleted

## 2012-09-29 DIAGNOSIS — Z7901 Long term (current) use of anticoagulants: Secondary | ICD-10-CM

## 2012-09-29 DIAGNOSIS — I4892 Unspecified atrial flutter: Secondary | ICD-10-CM

## 2012-09-29 LAB — POCT INR: INR: 1.8

## 2012-10-25 ENCOUNTER — Other Ambulatory Visit: Payer: Self-pay | Admitting: Cardiology

## 2012-11-11 ENCOUNTER — Other Ambulatory Visit: Payer: Self-pay | Admitting: Cardiology

## 2012-11-25 ENCOUNTER — Emergency Department (HOSPITAL_COMMUNITY)
Admission: EM | Admit: 2012-11-25 | Discharge: 2012-11-25 | Disposition: A | Discharge status: Home / Self Care | Payer: BC Managed Care – PPO | Attending: Emergency Medicine | Admitting: Emergency Medicine

## 2012-11-25 ENCOUNTER — Emergency Department (HOSPITAL_COMMUNITY): Payer: BC Managed Care – PPO

## 2012-11-25 ENCOUNTER — Encounter (HOSPITAL_COMMUNITY): Payer: Self-pay | Admitting: Emergency Medicine

## 2012-11-25 DIAGNOSIS — I809 Phlebitis and thrombophlebitis of unspecified site: Secondary | ICD-10-CM | POA: Insufficient documentation

## 2012-11-25 DIAGNOSIS — I10 Essential (primary) hypertension: Secondary | ICD-10-CM | POA: Insufficient documentation

## 2012-11-25 DIAGNOSIS — E119 Type 2 diabetes mellitus without complications: Secondary | ICD-10-CM | POA: Insufficient documentation

## 2012-11-25 DIAGNOSIS — R0789 Other chest pain: Secondary | ICD-10-CM | POA: Insufficient documentation

## 2012-11-25 DIAGNOSIS — Z951 Presence of aortocoronary bypass graft: Secondary | ICD-10-CM | POA: Insufficient documentation

## 2012-11-25 DIAGNOSIS — I252 Old myocardial infarction: Secondary | ICD-10-CM | POA: Insufficient documentation

## 2012-11-25 DIAGNOSIS — Z79899 Other long term (current) drug therapy: Secondary | ICD-10-CM | POA: Insufficient documentation

## 2012-11-25 DIAGNOSIS — R079 Chest pain, unspecified: Secondary | ICD-10-CM

## 2012-11-25 DIAGNOSIS — R110 Nausea: Secondary | ICD-10-CM | POA: Diagnosis present

## 2012-11-25 DIAGNOSIS — D7589 Other specified diseases of blood and blood-forming organs: Secondary | ICD-10-CM | POA: Insufficient documentation

## 2012-11-25 DIAGNOSIS — I2510 Atherosclerotic heart disease of native coronary artery without angina pectoris: Secondary | ICD-10-CM | POA: Diagnosis present

## 2012-11-25 DIAGNOSIS — I4891 Unspecified atrial fibrillation: Secondary | ICD-10-CM | POA: Insufficient documentation

## 2012-11-25 DIAGNOSIS — N189 Chronic kidney disease, unspecified: Secondary | ICD-10-CM | POA: Insufficient documentation

## 2012-11-25 DIAGNOSIS — R61 Generalized hyperhidrosis: Secondary | ICD-10-CM | POA: Diagnosis present

## 2012-11-25 DIAGNOSIS — K0889 Other specified disorders of teeth and supporting structures: Secondary | ICD-10-CM

## 2012-11-25 LAB — CBC WITH DIFFERENTIAL/PLATELET
Basophils Relative: 0 % (ref 0–1)
HCT: 43.6 % (ref 39.0–52.0)
Hemoglobin: 14.9 g/dL (ref 13.0–17.0)
Lymphocytes Relative: 6 % — ABNORMAL LOW (ref 12–46)
Lymphs Abs: 0.5 10*3/uL — ABNORMAL LOW (ref 0.7–4.0)
Monocytes Absolute: 0.4 10*3/uL (ref 0.1–1.0)
Monocytes Relative: 5 % (ref 3–12)
Neutro Abs: 7.6 10*3/uL (ref 1.7–7.7)
Neutrophils Relative %: 89 % — ABNORMAL HIGH (ref 43–77)
RBC: 4.05 MIL/uL — ABNORMAL LOW (ref 4.22–5.81)
WBC: 8.5 10*3/uL (ref 4.0–10.5)

## 2012-11-25 LAB — PROTIME-INR
INR: 3.56 — ABNORMAL HIGH (ref 0.00–1.49)
Prothrombin Time: 34.3 seconds — ABNORMAL HIGH (ref 11.6–15.2)

## 2012-11-25 LAB — POCT I-STAT TROPONIN I: Troponin i, poc: 0.02 ng/mL (ref 0.00–0.08)

## 2012-11-25 LAB — COMPREHENSIVE METABOLIC PANEL
AST: 30 U/L (ref 0–37)
CO2: 29 mEq/L (ref 19–32)
Calcium: 9.2 mg/dL (ref 8.4–10.5)
Creatinine, Ser: 1 mg/dL (ref 0.50–1.35)
GFR calc Af Amer: >90 mL/min (ref >90)
GFR calc non Af Amer: 78 mL/min — ABNORMAL LOW (ref >90)

## 2012-11-25 MED ORDER — PENICILLIN V POTASSIUM 500 MG PO TABS: 500.0000 mg | ORAL_TABLET | Freq: Four times a day (QID) | ORAL | Status: DC

## 2012-11-25 MED ORDER — HYDROCODONE-ACETAMINOPHEN 5-325 MG PO TABS
1.0000 | ORAL_TABLET | Freq: Once | ORAL | Status: DC
  Filled 2012-11-25 (×2): qty 1

## 2012-11-25 NOTE — ED Notes (Signed)
Patient states he started "feeling bad last night.  I don't have any chest pain, but I got sweaty and I was nauseated.  It reminded me of my heart problems".

## 2012-11-25 NOTE — ED Notes (Signed)
Pt not given vicodin at this time, pt requested to eat, unable to let pt eat at this time until I-stat troponin is resulted.

## 2012-11-25 NOTE — ED Notes (Signed)
lebaurer arrived at bedside.

## 2012-11-25 NOTE — ED Notes (Signed)
Pt c/o nausea and diaphoresis today; pt denies CP

## 2012-11-25 NOTE — Consult Note (Signed)
CARDIOLOGY CONSULT NOTE   Patient ID: [name] [name] MRN: [id number] DOB/AGE: 08-14-1950 62 y.o.  Admit date: 11/25/2012  Primary Physician   Talmage Coin, MD Primary Cardiologist   SM, Sidney Ace Reason for Consultation   Nausea, diaphoresis  [name] [name] is a 62 y.o. male with a history of CAD. He had atrial fibrillation/flutter in 2011 and has been on coumadin since then.   He woke this am with dental pain. He is aware of 2 bad teeth, knows they need to come out. He took hydrocodone about 10 am, but had not eaten. Aout 11 am, he developed nausea and diaphoresis (cold sweat). May have been running a fever, but did not check. He did not have palpitations, chest pain, SOB, presyncope or syncope. No symptoms that reminded him of his heart attack. He was concerned that something was wrong with his heart so came to the ER. The diaphoresis lasted about 10 minutes. The nausea lasted longer, but resolved prior to arrival in the ER (he lives in [place] area). Except for tooth pain, he has no current complaints.    Past Medical History  Diagnosis Date  . Coronary atherosclerosis of native coronary artery     Multivessel, LVEF 65%  . Superficial thrombophlebitis   . Varicose veins   . Diabetes mellitus, type 2   . Hyperlipidemia   . Chronic back pain   . Essential hypertension, benign   . Atrial fibrillation 2011    Postoperative lap choley  . Atrial flutter 2011    Postoperative lap choley    Past Surgical History  Procedure Laterality Date  . Coronary artery bypass graft  2004    LIMA to LAD, SVG to D1 and OM1, SVG to D3, and SVG to PDA  . Cholecystectomy    . Cardiac catheterization  2008    L Main 40%, LAD 100%, CFX (dominant) 50%, OM2 80%, RCA OK, All grafts patent, SVG-PDA with 50% stenosis, EF nl   Allergies  Allergen Reactions  . Fish Oil Nausea And Vomiting  . Iohexol      Desc: THROAT SWELLING-REQUIRES 13 HR PREP     I have reviewed the patient's current  medications Prior to Admission medications   Medication Sig Start Date End Date Taking? Authorizing Provider  furosemide (LASIX) 40 MG tablet Take 40 mg by mouth daily as needed. For fluid retention.   Yes Historical Provider, MD  lisinopril (PRINIVIL,ZESTRIL) 40 MG tablet Take 1 tablet (40 mg total) by mouth daily. 08/01/12  Yes Jonelle Sidle, MD  metFORMIN (GLUCOPHAGE) 500 MG tablet Take 500 mg by mouth 2 (two) times daily.     Yes Historical Provider, MD  metoprolol (LOPRESSOR) 50 MG tablet TAKE 1 TABLET BY MOUTH TWICE A DAY 11/11/12  Yes Jonelle Sidle, MD  nitroGLYCERIN (NITROSTAT) 0.4 MG SL tablet Place 0.4 mg under the tongue every 5 (five) minutes as needed. For chest pains.   Yes Historical Provider, MD  pioglitazone (ACTOS) 15 MG tablet Take 15 mg by mouth daily.     Yes Historical Provider, MD  warfarin (COUMADIN) 5 MG tablet Take 2.5-5 mg by mouth See admin instructions. 2.5 mg ( 0.5 tab )  Monday Wednesday Friday Sunday  and 5 mg on Tuesday Thursday Saturday.   Yes Historical Provider, MD     History   Social History  . Marital Status: Single    Spouse Name: N/A    Number of Children: N/A  . Years of Education: N/A  Occupational History  . Retired since 2008    Social History Main Topics  . Smoking status: Never Smoker   . Smokeless tobacco: Never Used  . Alcohol Use: Yes     Comment: socially  . Drug Use: No  . Sexual Activity: Not on file   Other Topics Concern  . Not on file   Social History Narrative   Lives alone.     Family Status  Relation Status Death Age  . Mother Deceased     No history of CAD  . Father Deceased     No history of CAD   ROS: PO intake has been poor recently due to dental problems. He has chronic LE edema, today is no different. Sleeps in a recliner/couch due to back pain, frequently falls asleep with feet hanging down. Does 30 min of yard work and cares for house without ischemic symptoms. Does not exercise. Has neuropathy in  both feet, limiting him. Full 14 point review of systems complete and found to be negative unless listed above.  Physical Exam: Blood pressure 170/84, pulse 77, temperature 97.5 F (36.4 C), temperature source Oral, resp. rate 13, SpO2 99.00%.  General: Well developed, well nourished, male in no acute distress Head: Eyes PERRLA, No xanthomas.   Normocephalic and atraumatic, oropharynx without edema or exudate. Dentition: poor Lungs: essentially CTA Heart: HRRR S1 S2, no rub/gallop, No murmur. pulses are 2+ all extrem.   Neck: No carotid bruits. No lymphadenopathy.  JVD not elevated. Abdomen: Bowel sounds present, abdomen soft and non-tender without masses or hernias noted. Msk:  No spine or cva tenderness. No weakness, no joint deformities or effusions. Extremities: No clubbing or cyanosis. 1-2+ bilateral LE edema.  Neuro: Alert and oriented X 3. No focal deficits noted. Psych:  Good affect, responds appropriately Skin: No rashes or lesions noted.  Labs:   Lab Results  Component Value Date   WBC 8.5 11/25/2012   HGB 14.9 11/25/2012   HCT 43.6 11/25/2012   MCV 107.7* 11/25/2012   PLT 200 11/25/2012    Recent Labs  11/25/12 1446  INR 3.56*     Recent Labs Lab 11/25/12 1446  NA 138  K 3.5  CL 96  CO2 29  BUN 14  CREATININE 1.00  CALCIUM 9.2  PROT 7.1  BILITOT 0.8  ALKPHOS 99  ALT 26  AST 30  GLUCOSE 146*    Recent Labs  11/25/12 1445  TROPIPOC 0.00   Lipase  Date/Time Value Range Status  11/25/2012  2:46 PM 25  11 - 59 U/L Final   Echo: 01/13/2003 SUMMARY - Left ventricular ejection fraction was estimated to be 60 %. - Aortic valve thickness was mildly increased. There was lower normal aortic valve leaflet excursion. - The left atrium was mildly dilated. - Overall LV function is good. IMPRESSIONS - Overall LV function is good.  ECG:  11/25/2012 Normal sinus rhythm T wave abnormality, unclear significance Vent. rate 66 BPM PR interval 188 ms QRS duration 70  ms QT/QTc 382/400 ms P-R-T axes 66 54 216  Radiology:  Dg Chest 2 View 11/25/2012   *RADIOLOGY REPORT*  Clinical Data: Nausea  CHEST - 2 VIEW  Comparison: CT chest dated 04/15/2011  Findings: Increased interstitial markings without frank interstitial edema. No pleural effusion or pneumothorax.  Cardiomegaly. Postsurgical changes related to prior CABG.  Degenerative changes of the visualized thoracolumbar spine.  IMPRESSION: No evidence of acute cardiopulmonary disease.   Original Report Authenticated By: Charline Bills, M.D.  ASSESSMENT AND PLAN:   The patient was seen today by Dr. Elease Hashimoto, the patient evaluated and the data reviewed.  Principal Problem:   Nausea/Diaphoresis - Pt symptoms more consistent with S.E. From hydrocodone but he is at high risk for silent angina due to diabetes. No ischemic evaluation in 6 years. Will recheck POC troponin, if normal, d/c and f/u in [place] with  Stress test.   Active Problems:   CAD, NATIVE VESSEL - grafts patent in 2008    Chronic anticoagulation - coumadin level slightly elevated, will hold coumadin tonight, keep standard dosing otherwise and f/u with coumadin clinic.     Dental problems - Abx started by ER staff, agree, f/u with dentist tomorrow or ASAP.   SignedTheodore Demark, PA-C 11/25/2012 5:55 PM Beeper [phone]  Co-Sign MD  Attending Note:   The patient was seen and examined.  Agree with assessment and plan as noted above.  Changes made to the above note as needed.  [name] [name] took some Vicodin on an empty stomach and had nausea following that.  It was associated with some diaphoresis.  He did not have any classic symptoms with his original presentation prior to CABG.   His ECG shows some NS T wave inversion.    Exam is unremarkable  If his next troponin level is normal, we can safely send him home from th ER.  He has a script for antibiotic for his tooth ache.    He needs to see Dr. Diona Browner soon and should have an  outpatient stress myoview soon.  He has been told to return to the ER if he has any further symptoms.     Vesta Mixer, Montez Hageman., MD, [facility] 11/25/2012, 6:25 PM

## 2012-11-25 NOTE — ED Notes (Signed)
Called lab to perform istat troponin.

## 2012-11-25 NOTE — ED Provider Notes (Signed)
Patient signed out to me by Arthor Captain, PA-C, at change of shift.  Patient to be seen by [facility] cardiology.  Dispo pending their consult.  If discharged home, patient also to be d/c with antibiotic for dental problem, f/u with dentist tomorrow.  Please see Dr Oletta Lamas and [facility] cardiology notes for further information.  I informed patient about recent troponin result, pt verbalizes understanding regarding [place] f/u in [place], holding coumadin dosage tonight, and dental follow up.  Pt given return precautions.   Trixie Dredge, PA-C 11/25/12 2046

## 2012-11-25 NOTE — ED Provider Notes (Signed)
CSN: [id number]     Arrival date & time 11/25/12  1313 History   First MD Initiated Contact with Patient 11/25/12 1411     Chief Complaint  Patient presents with  . Nausea   (Consider location/radiation/quality/duration/timing/severity/associated sxs/prior Treatment) No language interpreter was used.    [name] [name] is a 62 year old male with a past medical history of coronary artery disease, history of MI and CABG, diabetes, hypertension, hyperlipidemia, who presents to the emergency department today with chief complaint of sudden onset nausea and diaphoresis.  Patient states that at approximately 11 AM this morning he had sudden onset nausea and cold sweats.  Patient states that this reminded him of his previous MI however he did not have the squeezing tightness and shortness of breath associated with BMI.  The patient states that the episode lasted approximately 15 minutes.  The patient did take nitroglycerin after the episode with resolution of his symptoms.  Patient continued emergency department for further evaluation.  The patient has also had some problems with 2 of the teeth on the right side of his mouth when upper and lower molar.  He states it may be related to these teeth he's having problems with.  The patient denies any vomiting, chest pain, radiation to the left arm or jaw, paresthesia.  The patient denies any difficulty breathing or swallowing.  He denies any fevers, chills myalgias or arthralgias.  Past Medical History  Diagnosis Date  . Coronary atherosclerosis of native coronary artery     Multivessel, LVEF 65%  . Superficial thrombophlebitis   . Varicose veins   . Diabetes mellitus, type 2   . Hyperlipidemia   . Chronic back pain   . Essential hypertension, benign   . Atrial fibrillation     Postoperative  . Atrial flutter     Postoperative   Past Surgical History  Procedure Laterality Date  . Coronary artery bypass graft  2004    LIMA to LAD, SVG to diagonal and  OM, SVG to diagonal and LAD and PDA  . Cholecystectomy     History reviewed. No pertinent family history. History  Substance Use Topics  . Smoking status: Never Smoker   . Smokeless tobacco: Never Used  . Alcohol Use: Yes     Comment: socially    Review of Systems  Constitutional: Negative for fever and chills.  HENT: Positive for dental problem.   Respiratory: Negative for cough, chest tightness and shortness of breath.   Cardiovascular: Negative for chest pain and palpitations.  Gastrointestinal: Positive for nausea. Negative for vomiting, abdominal pain, diarrhea and constipation.  Genitourinary: Negative for dysuria, urgency and frequency.  Musculoskeletal: Negative for myalgias and arthralgias.  Skin: Negative for rash.  Neurological: Negative for headaches.    Allergies  Fish oil and Iohexol  Home Medications   Current Outpatient Rx  Name  Route  Sig  Dispense  Refill  . furosemide (LASIX) 40 MG tablet   Oral   Take 40 mg by mouth daily as needed. For fluid retention.         Marland Kitchen lisinopril (PRINIVIL,ZESTRIL) 40 MG tablet   Oral   Take 1 tablet (40 mg total) by mouth daily.   90 tablet   1   . metFORMIN (GLUCOPHAGE) 500 MG tablet   Oral   Take 500 mg by mouth 2 (two) times daily.           . metoprolol (LOPRESSOR) 50 MG tablet      TAKE 1 TABLET  BY MOUTH TWICE A DAY   180 tablet   3     .Marland KitchenMarland KitchenPatient needs to contact office to schedule  Ap ...   . nitroGLYCERIN (NITROSTAT) 0.4 MG SL tablet   Sublingual   Place 0.4 mg under the tongue every 5 (five) minutes as needed. For chest pains.         . pioglitazone (ACTOS) 15 MG tablet   Oral   Take 15 mg by mouth daily.           Marland Kitchen warfarin (COUMADIN) 5 MG tablet   Oral   Take 2.5-5 mg by mouth See admin instructions. 2.5 mg ( 0.5 tab )  Monday Wednesday Friday Sunday  and 5 mg on Tuesday Thursday Saturday.          BP 139/84  Pulse 63  Temp(Src) 97.5 F (36.4 C) (Oral)  Resp 13  SpO2  99% Physical Exam  Nursing note and vitals reviewed. Constitutional: He appears well-developed and well-nourished. No distress.  Obese male in no acute prescription strength  HENT:  Head: Normocephalic and atraumatic.  Eyes: Conjunctivae are normal. No scleral icterus.  Neck: Normal range of motion. Neck supple.  Cardiovascular: Normal rate, regular rhythm and normal heart sounds.   Pulmonary/Chest: Effort normal and breath sounds normal. No respiratory distress.  No chest wall tenderness  Abdominal: Soft. There is no tenderness.  Obese abdomen  Musculoskeletal: He exhibits no edema.  Neurological: He is alert.  Skin: Skin is warm and dry. He is not diaphoretic.  Telangiectasia of the face  Psychiatric: His behavior is normal.    ED Course  Procedures (including critical care time) Labs Review Labs Reviewed  CBC WITH DIFFERENTIAL - Abnormal; Notable for the following:    RBC 4.05 (*)    MCV 107.7 (*)    MCH 36.8 (*)    RDW 17.1 (*)    Neutrophils Relative % 89 (*)    Lymphocytes Relative 6 (*)    Lymphs Abs 0.5 (*)    All other components within normal limits  PROTIME-INR - Abnormal; Notable for the following:    Prothrombin Time 34.3 (*)    INR 3.56 (*)    All other components within normal limits  COMPREHENSIVE METABOLIC PANEL - Abnormal; Notable for the following:    Glucose, Bld 146 (*)    GFR calc non Af Amer 78 (*)    All other components within normal limits  LIPASE, BLOOD  POCT I-STAT TROPONIN I   Imaging Review Dg Chest 2 View  11/25/2012   *RADIOLOGY REPORT*  Clinical Data: Nausea  CHEST - 2 VIEW  Comparison: CT chest dated 04/15/2011  Findings: Increased interstitial markings without frank interstitial edema. No pleural effusion or pneumothorax.  Cardiomegaly. Postsurgical changes related to prior CABG.  Degenerative changes of the visualized thoracolumbar spine.  IMPRESSION: No evidence of acute cardiopulmonary disease.   Original Report Authenticated By:  Charline Bills, M.D.    Date: 11/25/2012  Rate: 66  Rhythm: normal sinus rhythm  QRS Axis: normal  Intervals: normal  ST/T Wave abnormalities: nonspecific T wave changes  Conduction Disutrbances:none  Narrative Interpretation:   Old EKG Reviewed: unchanged   MDM   1. Chest pain    High risk patient with nausea and diaphoresis. i have spoken with Trish from [place] who will see the patient here in the ED.  No further diaphoresis or nausea.  Negative troponin. EKG unchanged. Patient's INR is slightly supratherapeutic. Negative troponin. Hyperglycemia   .I  have given report to PA [place] who will assume care. If patient is cleared by cardiology, He may be discharged with amoxicillin for his dental problem.     Arthor Captain, PA-C 11/25/12 1706

## 2012-11-25 NOTE — ED Provider Notes (Signed)
Medical screening examination/treatment/procedure(s) were conducted as a shared visit with non-physician practitioner(s) and myself.  I personally evaluated the patient during the encounter  62 yo male with hx of CABG presenting after an episode of nausea and diaphoresis.  Possibly representing atypical angina given history.  Exam: Gen - NAD. CV - RRR, no m/r/g.  Pulm - CTAB Mouth - dental caries, tenderness to right upper and lower molars without gross evidence of abscess.  Initial workup reassuring, cardiology to evaluate.      Candyce Churn, MD 11/26/12 [phone]

## 2012-11-25 NOTE — ED Provider Notes (Signed)
Pt seen by [place], Dr. Elease Hashimoto.  Pt's symptoms are atypical, nausea has been resolved entire time.  Will check additional troponin, if neg, can be discharged and to follow up with [facility] cardiology in [place] or [place] office.  Pt to see dentist tomorrow.  He has been told to hold coumadin tonight.  If troponin is elevated, cardiology will admit.      Gavin Pound. Oletta Lamas, MD 11/25/12 3664

## 2012-11-25 NOTE — ED Notes (Signed)
Pt states he has a terribly tooth ache, pt states he needs something for his toothache. Pt very uncomfortable at this time. Pt reports that he was suppose to go to the dentist today to get his tooth looked at but had to cancel his appointment when he had to come to the ED for his nausea and sweating. Pt denies any nausea at this time.

## 2012-11-26 ENCOUNTER — Other Ambulatory Visit: Payer: Self-pay | Admitting: *Deleted

## 2012-11-26 DIAGNOSIS — R079 Chest pain, unspecified: Secondary | ICD-10-CM

## 2012-11-27 NOTE — ED Provider Notes (Signed)
Medical screening examination/treatment/procedure(s) were conducted as a shared visit with non-physician practitioner(s) and myself.  I personally evaluated the patient during the encounter.   Please see my separate note.     Candyce Churn, MD 11/27/12 [phone]

## 2012-11-27 NOTE — ED Provider Notes (Signed)
Medical screening examination/treatment/procedure(s) were conducted as a shared visit with non-physician practitioner(s) and myself.  I personally evaluated the patient during the encounter.   Please see my separate note.     Alexie Lanni David Tillmon Kisling, MD 11/27/12 1702 

## 2012-12-05 ENCOUNTER — Other Ambulatory Visit (HOSPITAL_COMMUNITY): Payer: BC Managed Care – PPO

## 2012-12-10 ENCOUNTER — Ambulatory Visit (INDEPENDENT_AMBULATORY_CARE_PROVIDER_SITE_OTHER): Payer: BC Managed Care – PPO | Admitting: *Deleted

## 2012-12-10 DIAGNOSIS — I4892 Unspecified atrial flutter: Secondary | ICD-10-CM

## 2012-12-10 DIAGNOSIS — Z7901 Long term (current) use of anticoagulants: Secondary | ICD-10-CM

## 2013-01-22 ENCOUNTER — Other Ambulatory Visit: Payer: Self-pay | Admitting: Cardiology

## 2013-02-16 ENCOUNTER — Ambulatory Visit (INDEPENDENT_AMBULATORY_CARE_PROVIDER_SITE_OTHER): Payer: BC Managed Care – PPO | Admitting: *Deleted

## 2013-02-16 ENCOUNTER — Ambulatory Visit (INDEPENDENT_AMBULATORY_CARE_PROVIDER_SITE_OTHER): Payer: BC Managed Care – PPO | Admitting: Cardiology

## 2013-02-16 ENCOUNTER — Encounter: Payer: Self-pay | Admitting: Cardiology

## 2013-02-16 VITALS — BP 143/89 | HR 100 | Ht 70.0 in | Wt 256.0 lb

## 2013-02-16 DIAGNOSIS — I10 Essential (primary) hypertension: Secondary | ICD-10-CM

## 2013-02-16 DIAGNOSIS — Z7901 Long term (current) use of anticoagulants: Secondary | ICD-10-CM

## 2013-02-16 DIAGNOSIS — I4892 Unspecified atrial flutter: Secondary | ICD-10-CM

## 2013-02-16 DIAGNOSIS — I2510 Atherosclerotic heart disease of native coronary artery without angina pectoris: Secondary | ICD-10-CM

## 2013-02-16 LAB — POCT INR: INR: 1.8

## 2013-02-16 MED ORDER — FUROSEMIDE 40 MG PO TABS: 40.0000 mg | ORAL_TABLET | Freq: Every day | ORAL | Status: DC | PRN

## 2013-02-16 NOTE — Progress Notes (Signed)
Clinical Summary [name] is a 62 y.o.male last seen in May of this year. He reports no recurring angina symptoms are nitroglycerin requirement. Main complaint centers around dental problems. He states that he has had 8 teeth extracted, also a bone fragment removed from his jaw. He has poor dental work pending.  Lab work from September showed hemoglobin 14.9, platelets 200, potassium 3.5, BUN 14, creatinine 1.0. No recent Coumadin clinic visits found.  Record review finds cardiology consultation back in September by Dr. Elease [name] with symptoms of nausea and diaphoresis after taking a pain medication. Cardiac markers were reassuring, he was discharged from the ER without further cardiac workup, discussion had about possible outpatient stress testing. We discussed this, he has had no recurring symptoms, and preferred to hold off on additional testing for now.   Allergies  Allergen Reactions  . Fish Oil Nausea And Vomiting  . Iohexol      Desc: THROAT SWELLING-REQUIRES 13 HR PREP     Current Outpatient Prescriptions  Medication Sig Dispense Refill  . furosemide (LASIX) 40 MG tablet Take 1 tablet (40 mg total) by mouth daily as needed. For fluid retention.  30 tablet  6  . lisinopril (PRINIVIL,ZESTRIL) 40 MG tablet TAKE 1 TABLET EVERY DAY  30 tablet  0  . metFORMIN (GLUCOPHAGE) 500 MG tablet Take 500 mg by mouth 2 (two) times daily.        . metoprolol (LOPRESSOR) 50 MG tablet TAKE 1 TABLET BY MOUTH TWICE A DAY  180 tablet  3  . nitroGLYCERIN (NITROSTAT) 0.4 MG SL tablet Place 0.4 mg under the tongue every 5 (five) minutes as needed. For chest pains.      Marland Kitchen penicillin v potassium (VEETID) 500 MG tablet Take 1 tablet (500 mg total) by mouth 4 (four) times daily.  40 tablet  0  . pioglitazone (ACTOS) 15 MG tablet Take 15 mg by mouth daily.        Marland Kitchen warfarin (COUMADIN) 5 MG tablet Take 2.5-5 mg by mouth See admin instructions. 2.5 mg ( 0.5 tab )  Monday Wednesday Friday Sunday  and 5 mg on  Tuesday Thursday Saturday.       No current facility-administered medications for this visit.    Past Medical History  Diagnosis Date  . Coronary atherosclerosis of native coronary artery     Multivessel, LVEF 65%  . Superficial thrombophlebitis   . Varicose veins   . Diabetes mellitus, type 2   . Hyperlipidemia   . Chronic back pain   . Essential hypertension, benign   . Atrial fibrillation 2011    Postoperative lap chole  . Atrial flutter 2011    Postoperative lap chole    Past Surgical History  Procedure Laterality Date  . Coronary artery bypass graft  2004    LIMA to LAD, SVG to D1 and OM1, SVG to D3, and SVG to PDA  . Cholecystectomy    . Cardiac catheterization  2008    L Main 40%, LAD 100%, CFX (dominant) 50%, OM2 80%, RCA OK, All grafts patent, SVG-PDA with 50% stenosis, EF nl    Social History [name] reports that he has never smoked. He has never used smokeless tobacco. [name] reports that he drinks alcohol.  Review of Systems No palpitations, no reported bleeding problems. States that he has been compliant with his medications. Uses Lasix perhaps twice a week for chronic leg swelling. Otherwise negative except as outlined.  Physical Examination Filed Vitals:  02/16/13 1320  BP: 143/89  Pulse: 100   Filed Weights   02/16/13 1320  Weight: 256 lb (116.121 kg)    Chronically ill-appearing obese male in no acute distress.  HEENT: Conjunctiva and lids normal, oropharynx with moist mucosa.  Neck: Supple, no elevated JVP or carotid bruits, no thyromegaly.  Lungs: Clear to auscultation, nonlabored.  Cardiac: Regular rate and rhythm, no S3.  Abdomen: Soft, nontender, bowel sounds present.  Skin: Warm and dry, somewhat ruddy complexion.  Extremities: Venous stasis and 1+ edema, distal pulses 1-2+.  Musculoskeletal: No kyphosis.  Neuropsychiatric: Alert and oriented x3, affect grossly appropriate.   Problem List and Plan   CAD, NATIVE  VESSEL Symptomatically stable at this point. After discussion, plan is to continue medical therapy and observation.  Atrial flutter Perioperative, diagnosed in 2011. Plan to continue Coumadin and beta blocker. To have followup INR in the Coumadin clinic today.  Essential hypertension, benign Continue follow up with primary care.    [name] [name], M.D., F.A.C.C.

## 2013-02-16 NOTE — Patient Instructions (Addendum)
Your physician wants you to follow-up in: 6 MONTHS You will receive a reminder letter in the mail two months in advance. If you don't receive a letter, please call our office to schedule the follow-up appointment. 

## 2013-02-16 NOTE — Assessment & Plan Note (Signed)
Continue follow-up with primary care 

## 2013-02-16 NOTE — Assessment & Plan Note (Signed)
Symptomatically stable at this point. After discussion, plan is to continue medical therapy and observation.

## 2013-02-16 NOTE — Assessment & Plan Note (Addendum)
Perioperative, diagnosed in 2011. Plan to continue Coumadin and beta blocker. To have followup INR in the Coumadin clinic today.

## 2013-02-20 ENCOUNTER — Other Ambulatory Visit: Payer: Self-pay | Admitting: Cardiology

## 2013-03-05 IMAGING — CT CT CHEST W/O CM
2 of 3 series · 15 of 36 positions shown, 18 images · non-contrast
Comparison: None

CLINICAL DATA: Pain post fall, history chronic back pain, coronary
disease post CABG, diabetes, hyperlipidemia, hypertension, atrial
fibrillation

CT CHEST WITHOUT CONTRAST
TECHNIQUE: Multidetector CT imaging of the chest was performed
following the standard protocol without IV contrast. Sagittal and
coronal MPR images reconstructed from axial data set.

[Series 3: chestroutine 5.0 b40f · axial · 0.67mm/px · z∈[-316,-76]mm · 12 of 58 slices shown, 15 images]
[im 5/58  mediastinal]
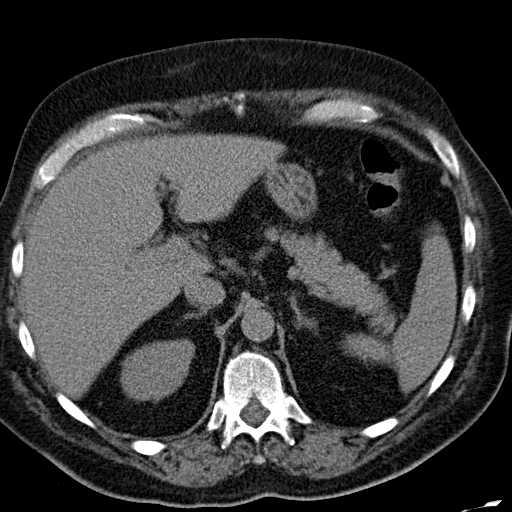
[im 5/58  lung]
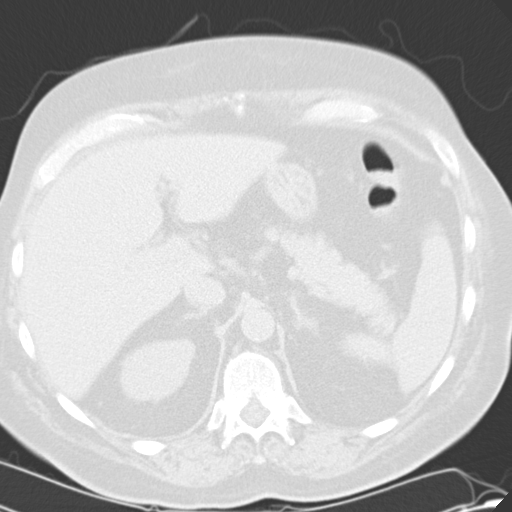
[im 9/58  lung]
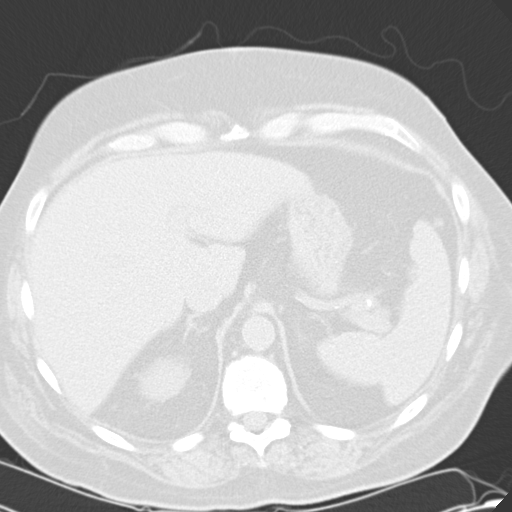
[im 13/58  lung]
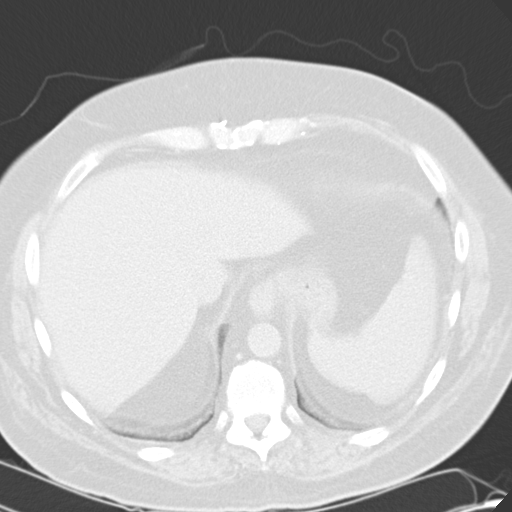
[im 17/58  lung]
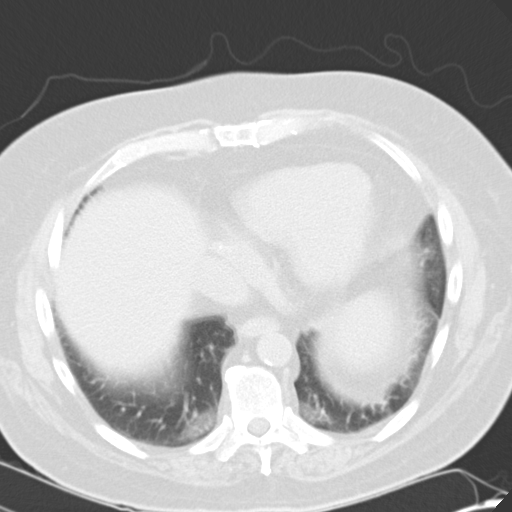
[im 22/58  mediastinal]
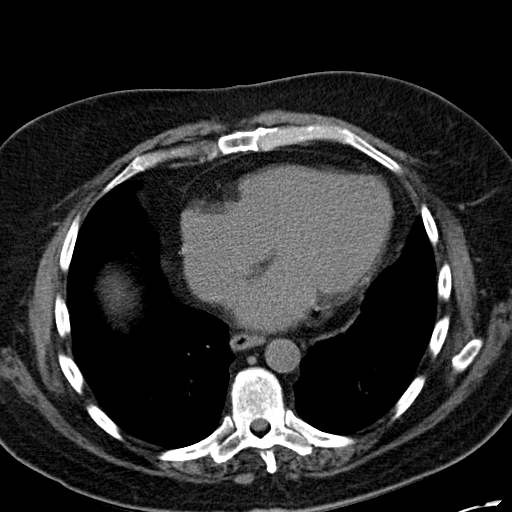
[im 22/58  lung]
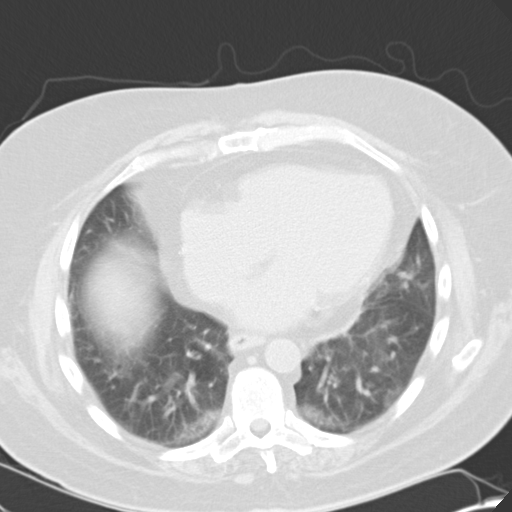
[im 26/58  lung]
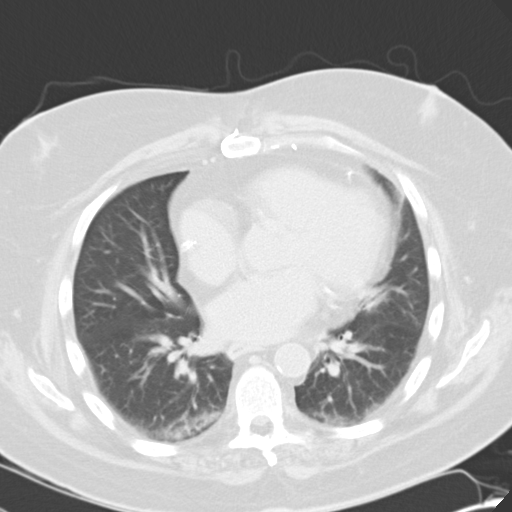
[im 32/58  lung]
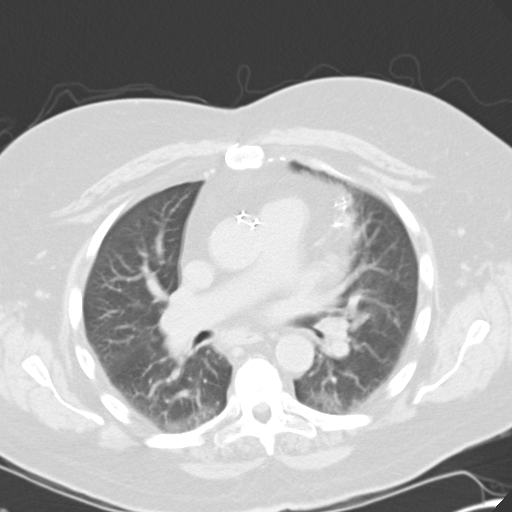
[im 36/58  lung]
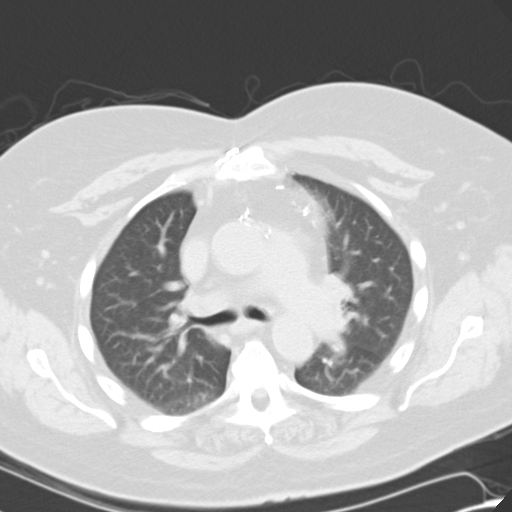
[im 41/58  mediastinal]
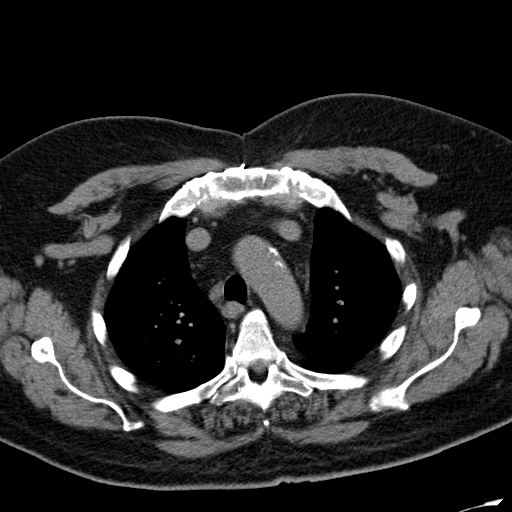
[im 41/58  lung]
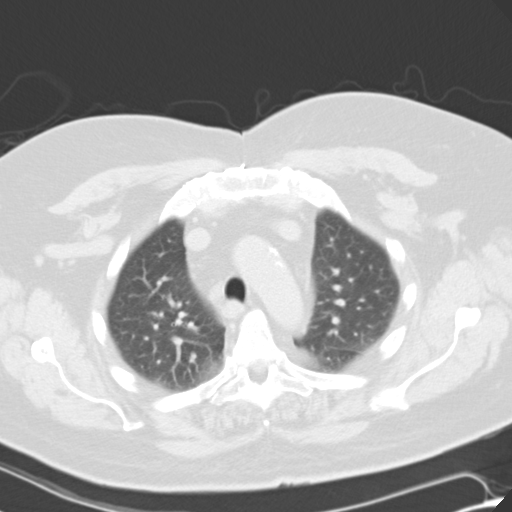
[im 45/58  lung]
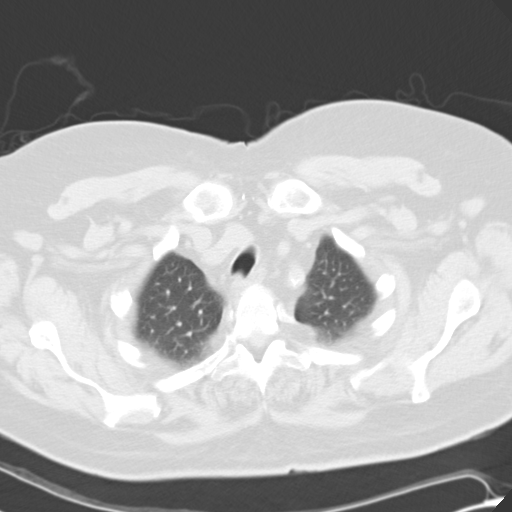
[im 49/58  lung]
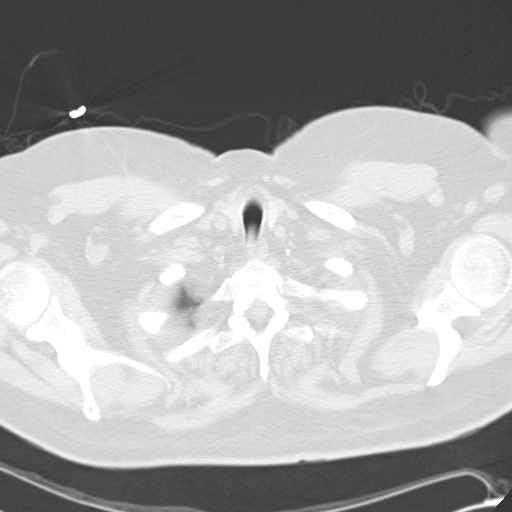
[im 53/58  lung]
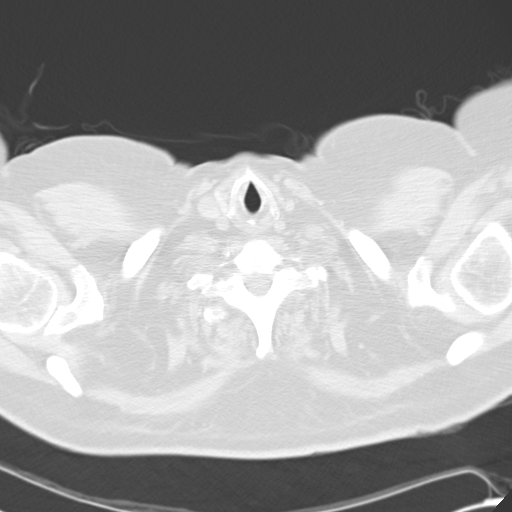

[Series 5: mpr coro 3mm · coronal · 0.62mm/px · 3 of 109 slices shown]
[im 22/109  lung]
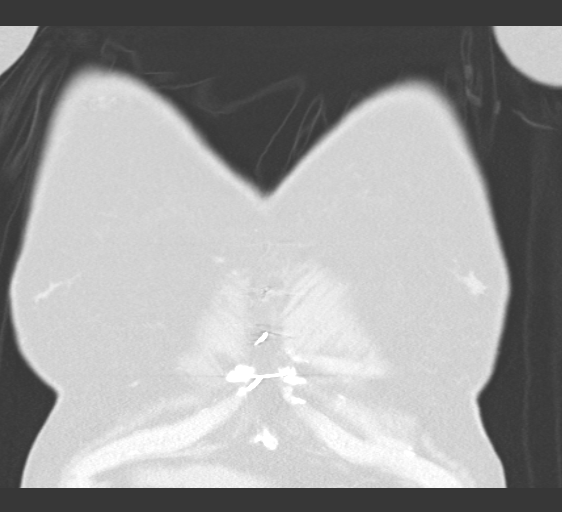
[im 44/109  lung]
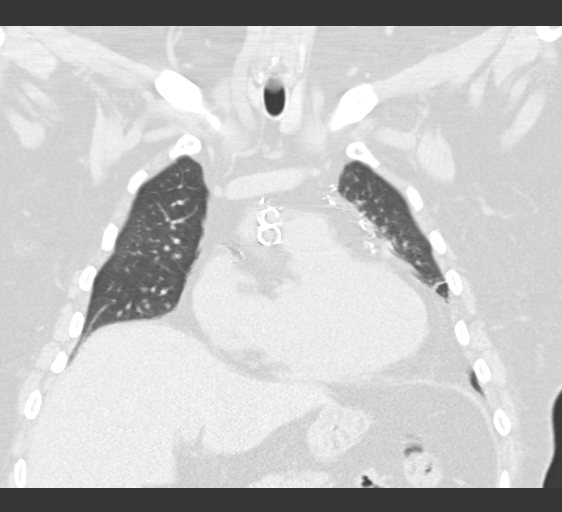
[im 65/109  lung]
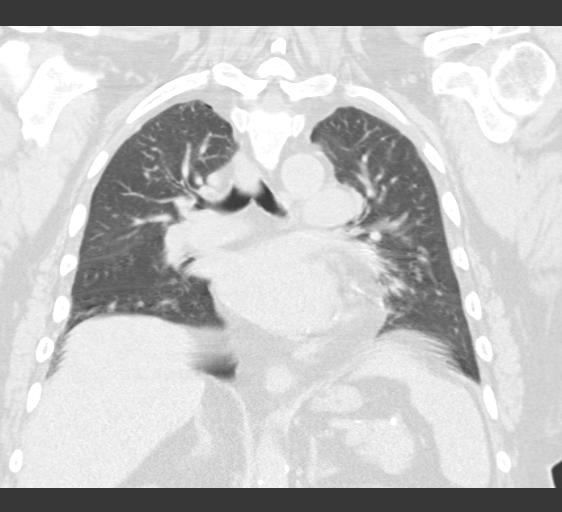

[15 of 36 positions shown; findings below may reference images not displayed]

FINDINGS: Scattered atherosclerotic calcifications aorta and coronary
arteries.
Postsurgical changes of CABG.
No aortic aneurysm identified.
Visualized portion of upper abdomen normal appearance.
No thoracic adenopathy.
Dependent atelectasis in the lower lobes bilaterally.

Scattered streak motion artifacts.
Lungs otherwise clear.
Generally low lung volumes.
No pleural effusion or pneumothorax.
Tiny bone island left glenoid.
No acute osseous findings.
IMPRESSION: Atherosclerotic disease with postsurgical changes of CABG.
Minimal dependent atelectasis and bilateral lower lobes.
Otherwise negative exam.

## 2013-03-05 IMAGING — CR DG KNEE COMPLETE 4+V*L*
4 series · 4 of 4 positions shown · non-contrast
Comparison: None

CLINICAL DATA: Left knee pain post fall

LEFT KNEE - COMPLETE 4+ VIEW

[view not recorded (1 of 4)]
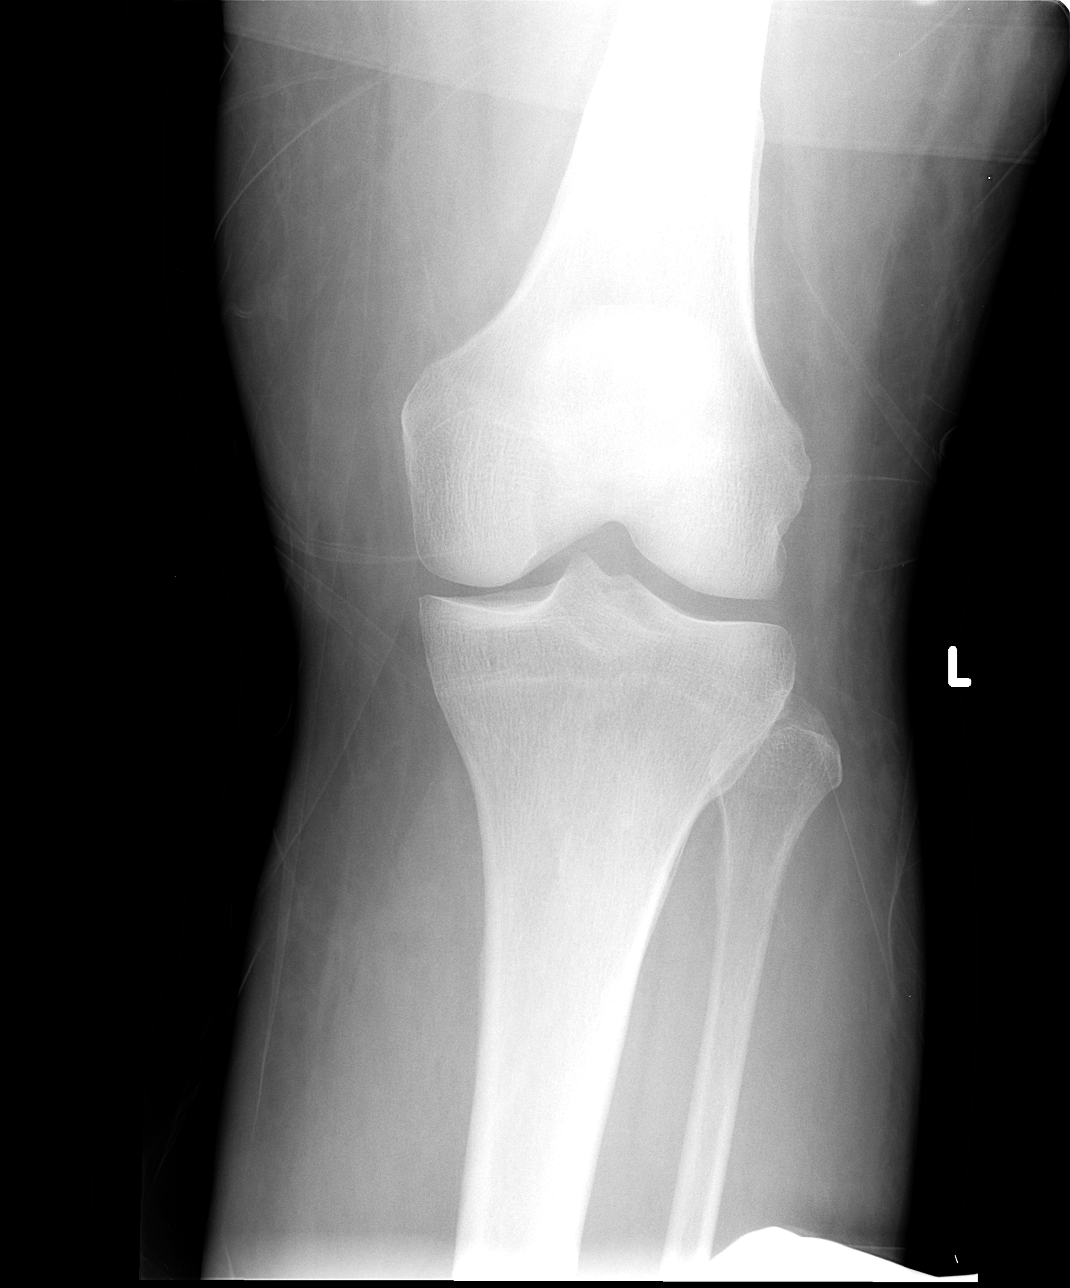

[view not recorded (2 of 4)]
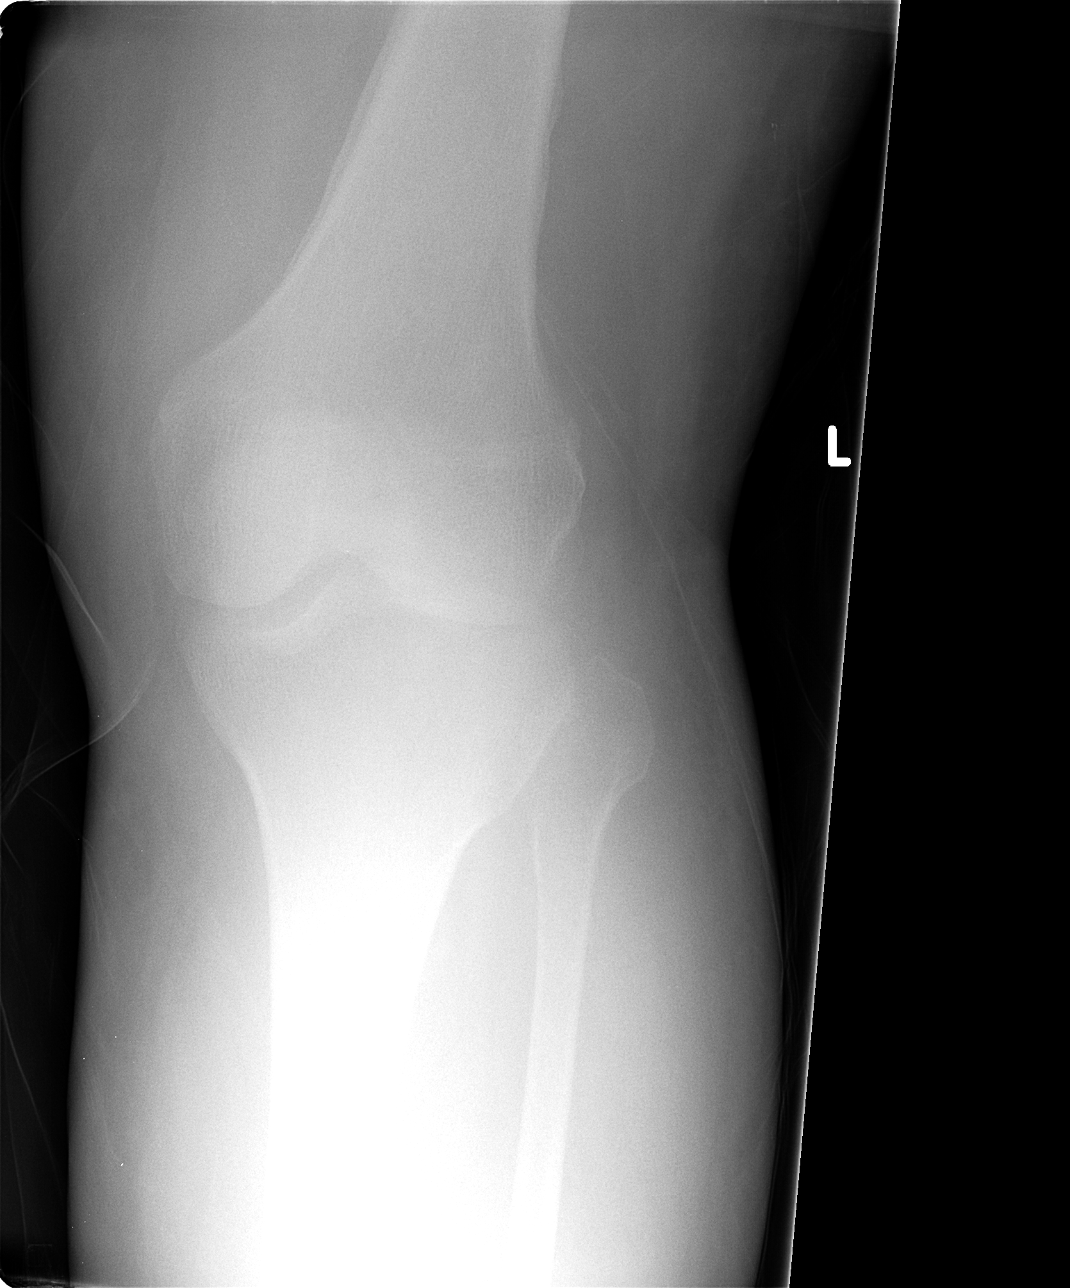

[view not recorded (3 of 4)]
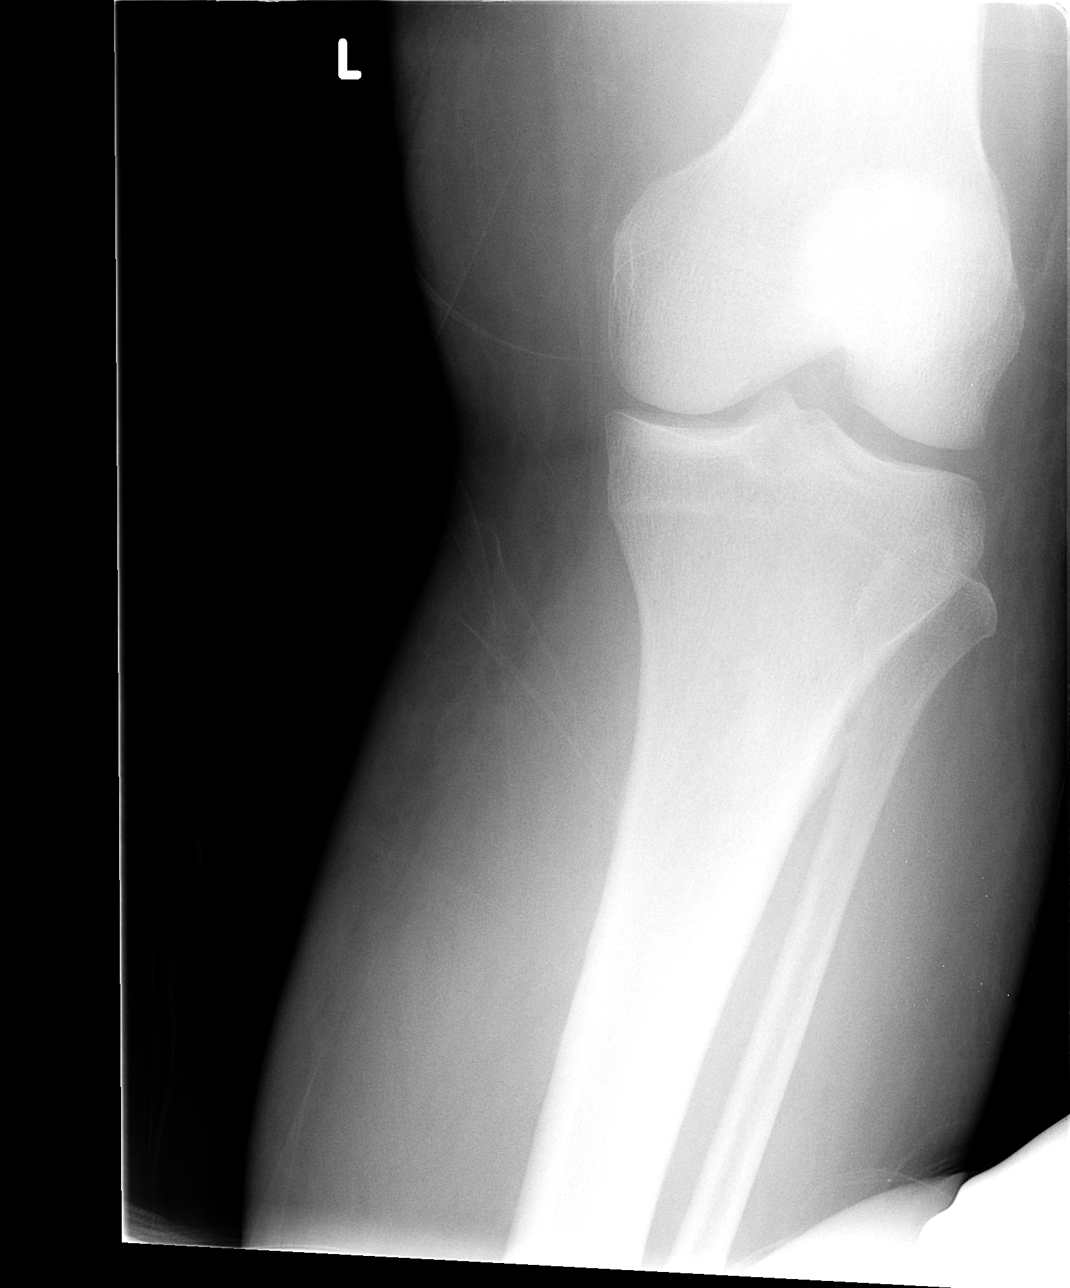

[view not recorded (4 of 4)]
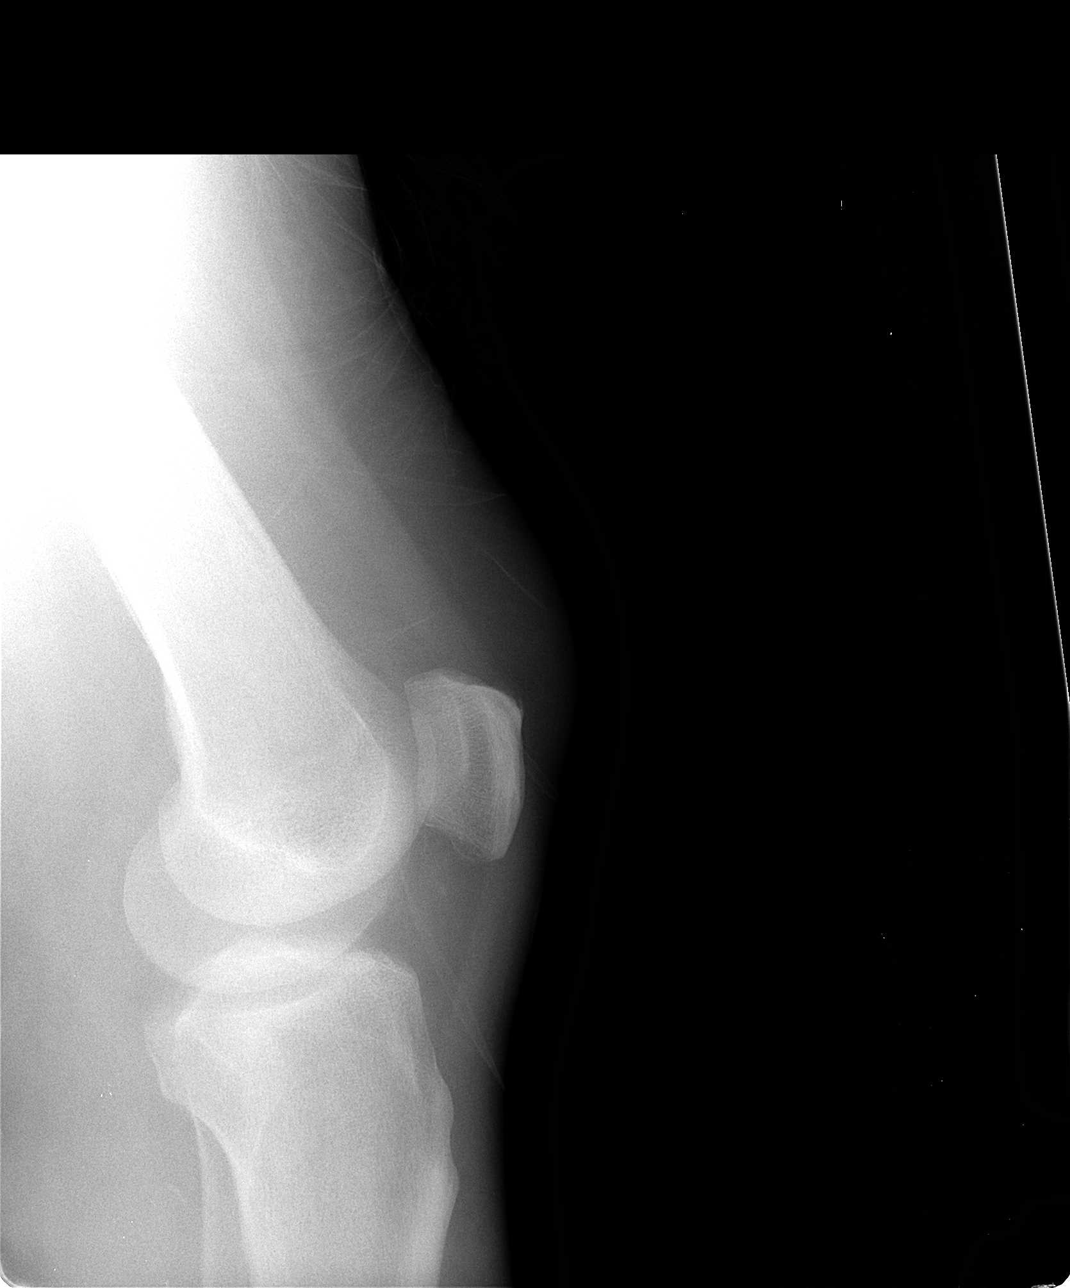

[4 of 4 positions shown; findings below may reference images not displayed]

FINDINGS: Bones appear demineralized.
Diffuse soft tissue swelling.
Minimal medial compartment joint space narrowing.
No acute fracture, dislocation or bone destruction.
Well-formed periosteal new bone is seen along the distal left
femoral metadiaphysis and questionably at the proximal tibial
metaphysis on single view.
No knee joint effusion.
IMPRESSION: No acute bony abnormalities.
Periosteal bone along the distal femur and questionably at the
proximal tibia, compatible with periostitis; differential diagnosis
includes hypertrophic osteoarthropathy, venous stasis,
hypervitaminosis A, and thyroid acropachy.

## 2013-03-16 ENCOUNTER — Telehealth: Payer: Self-pay | Admitting: Cardiology

## 2013-03-16 MED ORDER — LISINOPRIL 40 MG PO TABS: ORAL_TABLET | ORAL | Status: DC

## 2013-03-16 NOTE — Telephone Encounter (Signed)
Received fax refill request  Rx #  Medication:   Qty  Sig:   Physician:     **please see paper request in refill bin / tgs**

## 2013-03-23 ENCOUNTER — Other Ambulatory Visit: Payer: Self-pay | Admitting: Cardiology

## 2013-05-29 ENCOUNTER — Emergency Department (HOSPITAL_COMMUNITY): Payer: BC Managed Care – PPO

## 2013-05-29 ENCOUNTER — Encounter (HOSPITAL_COMMUNITY): Payer: Self-pay | Admitting: Emergency Medicine

## 2013-05-29 ENCOUNTER — Inpatient Hospital Stay (HOSPITAL_COMMUNITY)
Admission: EM | Admit: 2013-05-29 | Discharge: 2013-06-01 | DRG: 292 | Disposition: A | Discharge status: Home / Self Care | Payer: BC Managed Care – PPO | Attending: Internal Medicine | Admitting: Internal Medicine

## 2013-05-29 DIAGNOSIS — Z951 Presence of aortocoronary bypass graft: Secondary | ICD-10-CM

## 2013-05-29 DIAGNOSIS — E782 Mixed hyperlipidemia: Secondary | ICD-10-CM

## 2013-05-29 DIAGNOSIS — I4892 Unspecified atrial flutter: Secondary | ICD-10-CM

## 2013-05-29 DIAGNOSIS — R609 Edema, unspecified: Secondary | ICD-10-CM

## 2013-05-29 DIAGNOSIS — E119 Type 2 diabetes mellitus without complications: Secondary | ICD-10-CM | POA: Diagnosis present

## 2013-05-29 DIAGNOSIS — I2510 Atherosclerotic heart disease of native coronary artery without angina pectoris: Secondary | ICD-10-CM

## 2013-05-29 DIAGNOSIS — I252 Old myocardial infarction: Secondary | ICD-10-CM

## 2013-05-29 DIAGNOSIS — M7989 Other specified soft tissue disorders: Secondary | ICD-10-CM

## 2013-05-29 DIAGNOSIS — R791 Abnormal coagulation profile: Secondary | ICD-10-CM

## 2013-05-29 DIAGNOSIS — R600 Localized edema: Secondary | ICD-10-CM

## 2013-05-29 DIAGNOSIS — I509 Heart failure, unspecified: Secondary | ICD-10-CM

## 2013-05-29 DIAGNOSIS — M109 Gout, unspecified: Secondary | ICD-10-CM

## 2013-05-29 DIAGNOSIS — Z7901 Long term (current) use of anticoagulants: Secondary | ICD-10-CM

## 2013-05-29 DIAGNOSIS — I10 Essential (primary) hypertension: Secondary | ICD-10-CM

## 2013-05-29 DIAGNOSIS — I872 Venous insufficiency (chronic) (peripheral): Secondary | ICD-10-CM | POA: Diagnosis present

## 2013-05-29 DIAGNOSIS — K219 Gastro-esophageal reflux disease without esophagitis: Secondary | ICD-10-CM | POA: Diagnosis present

## 2013-05-29 DIAGNOSIS — R000 Tachycardia, unspecified: Secondary | ICD-10-CM

## 2013-05-29 DIAGNOSIS — IMO0002 Reserved for concepts with insufficient information to code with codable children: Secondary | ICD-10-CM

## 2013-05-29 DIAGNOSIS — I4891 Unspecified atrial fibrillation: Secondary | ICD-10-CM | POA: Diagnosis present

## 2013-05-29 DIAGNOSIS — I5033 Acute on chronic diastolic (congestive) heart failure: Principal | ICD-10-CM | POA: Diagnosis present

## 2013-05-29 LAB — BASIC METABOLIC PANEL
BUN: 10 mg/dL (ref 6–23)
CHLORIDE: 99 meq/L (ref 96–112)
CO2: 25 meq/L (ref 19–32)
Calcium: 9.1 mg/dL (ref 8.4–10.5)
Creatinine, Ser: 0.89 mg/dL (ref 0.50–1.35)
GFR calc Af Amer: >90 mL/min (ref >90)
GFR calc non Af Amer: 88 mL/min — ABNORMAL LOW (ref >90)
GLUCOSE: 98 mg/dL (ref 70–99)
POTASSIUM: 4.3 meq/L (ref 3.7–5.3)
SODIUM: 142 meq/L (ref 137–147)

## 2013-05-29 LAB — PRO B NATRIURETIC PEPTIDE: PRO B NATRI PEPTIDE: 1527 pg/mL — AB (ref 0–125)

## 2013-05-29 LAB — GLUCOSE, CAPILLARY
GLUCOSE-CAPILLARY: 90 mg/dL (ref 70–99)
GLUCOSE-CAPILLARY: 104 mg/dL — AB (ref 70–99)
Glucose-Capillary: 93 mg/dL (ref 70–99)

## 2013-05-29 LAB — PROTIME-INR
INR: 1.98 — AB (ref 0.00–1.49)
PROTHROMBIN TIME: 21.9 s — AB (ref 11.6–15.2)

## 2013-05-29 LAB — TSH: TSH: 6.071 u[IU]/mL — ABNORMAL HIGH (ref 0.350–4.500)

## 2013-05-29 LAB — TROPONIN I: Troponin I: <0.3 ng/mL (ref <0.30)

## 2013-05-29 LAB — CBC
HCT: 40.9 % (ref 39.0–52.0)
HEMOGLOBIN: 14 g/dL (ref 13.0–17.0)
MCH: 38.1 pg — ABNORMAL HIGH (ref 26.0–34.0)
MCHC: 34.2 g/dL (ref 30.0–36.0)
MCV: 111.4 fL — ABNORMAL HIGH (ref 78.0–100.0)
PLATELETS: 232 10*3/uL (ref 150–400)
RBC: 3.67 MIL/uL — AB (ref 4.22–5.81)
RDW: 15.7 % — ABNORMAL HIGH (ref 11.5–15.5)
WBC: 7.8 10*3/uL (ref 4.0–10.5)

## 2013-05-29 LAB — I-STAT TROPONIN, ED: TROPONIN I, POC: 0 ng/mL (ref 0.00–0.08)

## 2013-05-29 LAB — HEMOGLOBIN A1C
Hgb A1c MFr Bld: 6 % — ABNORMAL HIGH (ref <5.7)
MEAN PLASMA GLUCOSE: 126 mg/dL — AB (ref <117)

## 2013-05-29 MED ORDER — SODIUM CHLORIDE 0.9 % IJ SOLN
3.0000 mL | Freq: Two times a day (BID) | INTRAMUSCULAR | Status: DC
  Administered 2013-05-29 – 2013-06-01 (×6): 3 mL via INTRAVENOUS

## 2013-05-29 MED ORDER — FUROSEMIDE 10 MG/ML IJ SOLN
60.0000 mg | INTRAMUSCULAR | Status: AC
  Administered 2013-05-29: 60 mg via INTRAVENOUS
  Filled 2013-05-29: qty 6

## 2013-05-29 MED ORDER — INSULIN DETEMIR 100 UNIT/ML ~~LOC~~ SOLN
5.0000 [IU] | Freq: Every day | SUBCUTANEOUS | Status: DC
  Administered 2013-05-30 – 2013-05-31 (×2): 5 [IU] via SUBCUTANEOUS
  Filled 2013-05-29 (×4): qty 0.05

## 2013-05-29 MED ORDER — POTASSIUM CHLORIDE CRYS ER 20 MEQ PO TBCR
20.0000 meq | EXTENDED_RELEASE_TABLET | Freq: Every day | ORAL | Status: DC
  Administered 2013-05-29 – 2013-06-01 (×4): 20 meq via ORAL
  Filled 2013-05-29 (×5): qty 1

## 2013-05-29 MED ORDER — LISINOPRIL 40 MG PO TABS
40.0000 mg | ORAL_TABLET | Freq: Every day | ORAL | Status: DC
  Administered 2013-05-30 – 2013-06-01 (×3): 40 mg via ORAL
  Filled 2013-05-29 (×3): qty 1

## 2013-05-29 MED ORDER — DILTIAZEM HCL 25 MG/5ML IV SOLN
10.0000 mg | Freq: Once | INTRAVENOUS | Status: AC
  Administered 2013-05-29: 10 mg via INTRAVENOUS
  Filled 2013-05-29 (×2): qty 5

## 2013-05-29 MED ORDER — PANTOPRAZOLE SODIUM 40 MG PO TBEC
40.0000 mg | DELAYED_RELEASE_TABLET | Freq: Every day | ORAL | Status: DC
  Administered 2013-05-30 – 2013-06-01 (×3): 40 mg via ORAL
  Filled 2013-05-29 (×2): qty 1

## 2013-05-29 MED ORDER — FUROSEMIDE 10 MG/ML IJ SOLN
40.0000 mg | Freq: Two times a day (BID) | INTRAMUSCULAR | Status: DC
  Administered 2013-05-29 – 2013-05-31 (×5): 40 mg via INTRAVENOUS
  Filled 2013-05-29 (×8): qty 4

## 2013-05-29 MED ORDER — METOPROLOL TARTRATE 1 MG/ML IV SOLN
2.5000 mg | Freq: Once | INTRAVENOUS | Status: AC
  Administered 2013-05-29: 2.5 mg via INTRAVENOUS
  Filled 2013-05-29: qty 5

## 2013-05-29 MED ORDER — METOPROLOL TARTRATE 1 MG/ML IV SOLN
5.0000 mg | Freq: Once | INTRAVENOUS | Status: AC
  Administered 2013-05-29: 5 mg via INTRAVENOUS
  Filled 2013-05-29: qty 5

## 2013-05-29 MED ORDER — WARFARIN - PHARMACIST DOSING INPATIENT: Freq: Every day | Status: DC

## 2013-05-29 MED ORDER — WARFARIN SODIUM 4 MG PO TABS
4.0000 mg | ORAL_TABLET | Freq: Once | ORAL | Status: AC
  Administered 2013-05-29: 4 mg via ORAL
  Filled 2013-05-29: qty 1

## 2013-05-29 MED ORDER — METOPROLOL TARTRATE 50 MG PO TABS
75.0000 mg | ORAL_TABLET | Freq: Two times a day (BID) | ORAL | Status: DC
  Administered 2013-05-29: 75 mg via ORAL
  Filled 2013-05-29 (×3): qty 1

## 2013-05-29 MED ORDER — SODIUM CHLORIDE 0.9 % IV SOLN: 250.0000 mL | INTRAVENOUS | Status: DC | PRN

## 2013-05-29 MED ORDER — INSULIN ASPART 100 UNIT/ML ~~LOC~~ SOLN
0.0000 [IU] | Freq: Three times a day (TID) | SUBCUTANEOUS | Status: DC
  Administered 2013-05-30: 1 [IU] via SUBCUTANEOUS
  Administered 2013-05-31: 2 [IU] via SUBCUTANEOUS
  Administered 2013-06-01: 1 [IU] via SUBCUTANEOUS

## 2013-05-29 MED ORDER — SODIUM CHLORIDE 0.9 % IJ SOLN
3.0000 mL | INTRAMUSCULAR | Status: DC | PRN
  Administered 2013-05-30: 3 mL via INTRAVENOUS

## 2013-05-29 NOTE — ED Notes (Signed)
2 attempts at IV; another RN to try.

## 2013-05-29 NOTE — H&P (Signed)
[facility] History and Physical  [name] [name] ZOX:[id number] DOB: 1951/01/22 DOA: 05/29/2013  Referring physician: Dr. Freida [name]  PCP: Talmage CoinKERR,JEFFREY, MD   Chief Complaint: LE edema, mild SOB on exertion   HPI: [name] [name] is a 63 y.o. male with pmh significant for DM type 2, atrial flutter/atrial fibrillation (on coumadin), varicose veins/venous insufficiency, HLD and HTN; came to ED complaining of worsening LE edema and mild SOB with exertion. Patient reports symptoms has been present for the last week or so and worsening. Patient denies CP, fever, cough, chills, nausea, vomiting, dysuria, hematemesis, melena or hematochezia. In ED was found to have HR in the 130-140's, #++ LE edema, elevated BNP and CXR with enlarged cardiac silhouette and vascular congestion. [facility] called to admit patient for CHF exacerbation.    Review of Systems:  Negative except as otherwise mentioned on HPI.  Past Medical History  Diagnosis Date  . Coronary atherosclerosis of native coronary artery     Multivessel, LVEF 65%  . Superficial thrombophlebitis   . Varicose veins   . Diabetes mellitus, type 2   . Hyperlipidemia   . Chronic back pain   . Essential hypertension, benign   . Atrial fibrillation 2011    Postoperative lap chole  . Atrial flutter 2011    Postoperative lap chole   Past Surgical History  Procedure Laterality Date  . Coronary artery bypass graft  2004    LIMA to LAD, SVG to D1 and OM1, SVG to D3, and SVG to PDA  . Cholecystectomy    . Cardiac catheterization  2008    L Main 40%, LAD 100%, CFX (dominant) 50%, OM2 80%, RCA OK, All grafts patent, SVG-PDA with 50% stenosis, EF nl   Social History:  reports that he has never smoked. He has never used smokeless tobacco. He reports that he drinks alcohol. He reports that he does not use illicit drugs.  Allergies  Allergen Reactions  . Shellfish Allergy Anaphylaxis  . Fish Oil Nausea And Vomiting  . Iohexol      Desc: THROAT  SWELLING-REQUIRES 13 HR PREP     Family Hx: positive for HTN and DM  Prior to Admission medications   Medication Sig Start Date End Date Taking? Authorizing Provider  furosemide (LASIX) 40 MG tablet Take 1 tablet (40 mg total) by mouth daily as needed. For fluid retention. 02/16/13  Yes [name] SidleSamuel G McDowell, MD  lisinopril (PRINIVIL,ZESTRIL) 40 MG tablet TAKE 1 TABLET EVERY DAY 03/16/13  Yes [name] SidleSamuel G McDowell, MD  metFORMIN (GLUCOPHAGE) 500 MG tablet Take 500 mg by mouth 2 (two) times daily.     Yes Historical Provider, MD  metoprolol (LOPRESSOR) 50 MG tablet TAKE 1 TABLET BY MOUTH TWICE A DAY 11/11/12  Yes [name] SidleSamuel G McDowell, MD  nitroGLYCERIN (NITROSTAT) 0.4 MG SL tablet Place 0.4 mg under the tongue every 5 (five) minutes as needed. For chest pains.   Yes Historical Provider, MD  pioglitazone (ACTOS) 15 MG tablet Take 15 mg by mouth daily.     Yes Historical Provider, MD  warfarin (COUMADIN) 5 MG tablet Take 2.5-5 mg by mouth See admin instructions. 2.5 mg ( 0.5 tab )  Monday Wednesday Friday Sunday  and 5 mg on Tuesday Thursday Saturday.   Yes Historical Provider, MD   Physical Exam: Filed Vitals:   05/29/13 1203  BP: 131/74  Pulse: 133  Temp: 97.6 F (36.4 C)  Resp: 16    BP 131/74  Pulse 133  Temp(Src) 97.6 F (36.4  C) (Oral)  Resp 16  Ht 5\' 10"  (1.778 m)  Wt 114.6 kg (252 lb 10.4 oz)  BMI 36.25 kg/m2  SpO2 100%  General:  Appears calm and comfortable; no fever Eyes: PERRL, normal lids, irises & conjunctiva ENT: grossly normal hearing, lips & tongue; no erythema or exudates Neck: no LAD, masses or thyromegaly; mild JVD Cardiovascular: tachycardia, no rubs or gallops. 3++ edema bilaterally Telemetry: Sinus tachycardia on telemetry Respiratory: CTA bilaterally, no w/r/r. Normal respiratory effort. Abdomen: soft, nt, nd and with positive BS Skin: positive Bilaterally LE venous stasis changes; no open wounds, no petechiae Musculoskeletal: grossly normal tone BUE/BLE; 3+ edema  bilaterally Psychiatric: grossly normal mood and affect, speech fluent and appropriate Neurologic: grossly non-focal.          Labs on Admission:  Basic Metabolic Panel:  Recent Labs Lab 05/29/13 0804  NA 142  K 4.3  CL 99  CO2 25  GLUCOSE 98  BUN 10  CREATININE 0.89  CALCIUM 9.1   CBC:  Recent Labs Lab 05/29/13 0804  WBC 7.8  HGB 14.0  HCT 40.9  MCV 111.4*  PLT 232   BNP (last 3 results)  Recent Labs  05/29/13 0804  PROBNP 1527.0*   CBG:  Recent Labs Lab 05/29/13 1245  GLUCAP 90    Radiological Exams on Admission: Dg Chest 2 View  05/29/2013   CLINICAL DATA:  Tachycardia, history of hypertension  EXAM: CHEST  2 VIEW  COMPARISON:  DG CHEST 2 VIEW dated 11/25/2012  FINDINGS: The lungs are adequately inflated. There is no focal infiltrate. The interstitial markings are mildly prominent bilaterally but stable. The cardiopericardial silhouette is normal in size. The pulmonary vascularity is not engorged. The patient has undergone previous CABG. There is no pleural effusion or pneumothorax. The trachea is midline. The observed portions of the bony thorax exhibit no acute abnormalities.  IMPRESSION: 1. There is mild stable prominence of the pulmonary interstitial markings in the setting of enlargement of the cardiac silhouette. The possibility of low-grade compensated CHF is raised. 2. There is no evidence of pneumonia nor pleural effusion nor pneumothorax.   Electronically Signed   By: David  [place]   On: 05/29/2013 09:22    EKG:  Rate: 135  Rhythm: sinus tachycardia  QRS Axis: normal  Intervals: normal  ST/T Wave abnormalities: nonspecific ST changes  Conduction Disutrbances:none  Narrative Interpretation:  Old EKG Reviewed: none available   Assessment/Plan 1-Acute on chronic diastolic heart failure: patient denies CP or palpitations; but HR is elevated in the 130-140's -will admit to tele -strict I's and O's -daily weight  -lasix 40mg  BID -continue  metoprolol (dose adjusted to 75mg  BID) -one dose cardizen given -will check TSH and cycle CE's -cardiology has been consulted; will follow rec's -will check 2-D echo  2-Mixed hyperlipidemia: will check lipid panel. If needed will start statins  3-Essential hypertension, benign: continue home meds. Lasix to be given IV and metoprolol dose adjusted.  4-Atrial flutter/atrial fibrillation: metoprolol dose adjusted. -continue coumadin per pharmacy -cardiology consulted; will follow rec's  5-Lower extremity edema: due to CHF on top of underlying venous insufficiency/stasis disease. -on lasix -will follow clinical response  6-DM type 2: will hold actos (especially with ongoing swelling) and metformin -check A1C -start SSI and low dose levemir  7-GERD: PPI  Cardiology (Dr. Tenny Craw)  Code Status: Full Family Communication: no family at bedside Disposition Plan: inpatient, LOS   Time spent: 50 minutes  Armen Pickup [facility] Pager [phone]

## 2013-05-29 NOTE — Plan of Care (Signed)
Problem: Phase I Progression Outcomes Goal: Initial discharge plan identified Outcome: Completed/Met Date Met:  05/29/13 Plan to discharge to home

## 2013-05-29 NOTE — ED Notes (Signed)
MD at bedside. 

## 2013-05-29 NOTE — ED Notes (Signed)
Pt returned from xray; to be revitaled and back on monitor.

## 2013-05-29 NOTE — Progress Notes (Signed)
Pt's pulse up to 140s when stands to void and down to 120s and 130s. Remaining in ST.  Pt asymptomatic and denies chest pain.  Dr. Gwenlyn PerkingMadera informed.

## 2013-05-29 NOTE — ED Notes (Signed)
Pt's legs are reddened and scaly.

## 2013-05-29 NOTE — ED Notes (Signed)
Lunch tray ordered 

## 2013-05-29 NOTE — ED Notes (Signed)
Patient transported to X-ray 

## 2013-05-29 NOTE — ED Notes (Signed)
PA at bedside.

## 2013-05-29 NOTE — ED Provider Notes (Signed)
CSN: [id number]     Arrival date & time 05/29/13  0749 History   First MD Initiated Contact with Patient 05/29/13 0759     Chief Complaint  Patient presents with  . Hypertension  . Tachycardia     (Consider location/radiation/quality/duration/timing/severity/associated sxs/prior Treatment) HPI Comments: [name] [name] is a 63 year old male with a past medical history of CAD, history of MI and CABG, diabetes, hypertension, hyperlipidemia, who presents to the emergency department with tachycardia and HTN.  The patient reports his BP has been elevated for several days, baseline BP 140's/80's.  He also reports elevated heart rate at 130's, baseline 60's.  He reports compliance with his lopressor 50 mg BID, and lisinopril 40 mg daily.  He reports an increase in lasix 40 mg use, from PRN several months ago he took one lasix 40 mg to recently almost daily use.  He reports increase in swelling to lower extremities for several weeks. The patient reports taking a 10 day course of Prednisone for a gout flair up, approximately 2 weeks ago.  He also states he has not had his INR checked since January 2015.  He denies chest pain, dyspnea, lightheadedness, headache, PND, orthopnea, cough, fever or chills. Cardiologist: Dr. Diona Browner.   Patient is a 63 y.o. male presenting with hypertension. The history is provided by the patient and medical records. No language interpreter was used.  Hypertension Pertinent negatives include no abdominal pain, chest pain, chills, coughing, fever, headaches, nausea, numbness or vomiting.    Past Medical History  Diagnosis Date  . Coronary atherosclerosis of native coronary artery     Multivessel, LVEF 65%  . Superficial thrombophlebitis   . Varicose veins   . Diabetes mellitus, type 2   . Hyperlipidemia   . Chronic back pain   . Essential hypertension, benign   . Atrial fibrillation 2011    Postoperative lap chole  . Atrial flutter 2011    Postoperative lap chole    Past Surgical History  Procedure Laterality Date  . Coronary artery bypass graft  2004    LIMA to LAD, SVG to D1 and OM1, SVG to D3, and SVG to PDA  . Cholecystectomy    . Cardiac catheterization  2008    L Main 40%, LAD 100%, CFX (dominant) 50%, OM2 80%, RCA OK, All grafts patent, SVG-PDA with 50% stenosis, EF nl   No family history on file. History  Substance Use Topics  . Smoking status: Never Smoker   . Smokeless tobacco: Never Used  . Alcohol Use: Yes     Comment: socially    Review of Systems  Constitutional: Negative for fever and chills.  Respiratory: Negative for cough, chest tightness, shortness of breath and wheezing.   Cardiovascular: Positive for palpitations and leg swelling. Negative for chest pain.  Gastrointestinal: Negative for nausea, vomiting, abdominal pain and diarrhea.  Skin: Positive for color change. Negative for wound.  Neurological: Negative for dizziness, light-headedness, numbness and headaches.      Allergies  Fish oil and Iohexol  Home Medications   Current Outpatient Rx  Name  Route  Sig  Dispense  Refill  . furosemide (LASIX) 40 MG tablet   Oral   Take 1 tablet (40 mg total) by mouth daily as needed. For fluid retention.   30 tablet   6   . lisinopril (PRINIVIL,ZESTRIL) 40 MG tablet      TAKE 1 TABLET EVERY DAY   30 tablet   6   . lisinopril (PRINIVIL,ZESTRIL) 40 MG  tablet      TAKE 1 TABLET EVERY DAY   90 tablet   1   . metFORMIN (GLUCOPHAGE) 500 MG tablet   Oral   Take 500 mg by mouth 2 (two) times daily.           . metoprolol (LOPRESSOR) 50 MG tablet      TAKE 1 TABLET BY MOUTH TWICE A DAY   180 tablet   3     .Marland KitchenMarland KitchenPatient needs to contact office to schedule  Ap ...   . nitroGLYCERIN (NITROSTAT) 0.4 MG SL tablet   Sublingual   Place 0.4 mg under the tongue every 5 (five) minutes as needed. For chest pains.         Marland Kitchen penicillin v potassium (VEETID) 500 MG tablet   Oral   Take 1 tablet (500 mg total) by  mouth 4 (four) times daily.   40 tablet   0   . pioglitazone (ACTOS) 15 MG tablet   Oral   Take 15 mg by mouth daily.           Marland Kitchen warfarin (COUMADIN) 5 MG tablet   Oral   Take 2.5-5 mg by mouth See admin instructions. 2.5 mg ( 0.5 tab )  Monday Wednesday Friday Sunday  and 5 mg on Tuesday Thursday Saturday.          BP 163/96  Pulse 132  Temp(Src) 97.5 F (36.4 C) (Oral)  Resp 17  Ht 5\' 10"  (1.778 m)  Wt 250 lb (113.399 kg)  BMI 35.87 kg/m2  SpO2 99% Physical Exam  Nursing note and vitals reviewed. Constitutional: He is oriented to person, place, and time. He appears well-developed and well-nourished. No distress.  HENT:  Head: Atraumatic.  Neck: Neck supple.  Cardiovascular: Regular rhythm.  Tachycardia present.   HR 130's Bilateral 2+ pitting edema extending past the knee.  Chronic veinous statis skin changes. Unable to palpate DP pulse, good cap refill.  Pulmonary/Chest: Effort normal and breath sounds normal. Not tachypneic. No respiratory distress. He has no decreased breath sounds. He has no wheezes. He has no rhonchi. He has no rales.  Patient is able to speak in complete sentences.    Abdominal: Soft. There is no tenderness. There is no rigidity, no rebound and no guarding.  Musculoskeletal: Normal range of motion.  Neurological: He is alert and oriented to person, place, and time. Coordination and gait normal.  Skin: Skin is warm and dry. He is not diaphoretic. There is erythema.  Psychiatric: He has a normal mood and affect. His behavior is normal.    ED Course  Procedures (including critical care time) Labs Review Results for orders placed during the hospital encounter of 05/29/13  CBC      Result Value Ref Range   WBC 7.8  4.0 - 10.5 K/uL   RBC 3.67 (*) 4.22 - 5.81 MIL/uL   Hemoglobin 14.0  13.0 - 17.0 g/dL   HCT 16.1  09.6 - 04.5 %   MCV 111.4 (*) 78.0 - 100.0 fL   MCH 38.1 (*) 26.0 - 34.0 pg   MCHC 34.2  30.0 - 36.0 g/dL   RDW 40.9 (*) 81.1 -  15.5 %   Platelets 232  150 - 400 K/uL  BASIC METABOLIC PANEL      Result Value Ref Range   Sodium 142  137 - 147 mEq/L   Potassium 4.3  3.7 - 5.3 mEq/L   Chloride 99  96 - 112 mEq/L  CO2 25  19 - 32 mEq/L   Glucose, Bld 98  70 - 99 mg/dL   BUN 10  6 - 23 mg/dL   Creatinine, Ser 9.560.89  0.50 - 1.35 mg/dL   Calcium 9.1  8.4 - 21.310.5 mg/dL   GFR calc non Af Amer 88 (*) >90 mL/min   GFR calc Af Amer >90  >90 mL/min  PRO B NATRIURETIC PEPTIDE      Result Value Ref Range   Pro B Natriuretic peptide (BNP) 1527.0 (*) 0 - 125 pg/mL  PROTIME-INR      Result Value Ref Range   Prothrombin Time 21.9 (*) 11.6 - 15.2 seconds   INR 1.98 (*) 0.00 - 1.49  I-STAT TROPOININ, ED      Result Value Ref Range   Troponin i, poc 0.00  0.00 - 0.08 ng/mL   Comment 3             Imaging Review Dg Chest 2 View  05/29/2013   CLINICAL DATA:  Tachycardia, history of hypertension  EXAM: CHEST  2 VIEW  COMPARISON:  DG CHEST 2 VIEW dated 11/25/2012  FINDINGS: The lungs are adequately inflated. There is no focal infiltrate. The interstitial markings are mildly prominent bilaterally but stable. The cardiopericardial silhouette is normal in size. The pulmonary vascularity is not engorged. The patient has undergone previous CABG. There is no pleural effusion or pneumothorax. The trachea is midline. The observed portions of the bony thorax exhibit no acute abnormalities.  IMPRESSION: 1. There is mild stable prominence of the pulmonary interstitial markings in the setting of enlargement of the cardiac silhouette. The possibility of low-grade compensated CHF is raised. 2. There is no evidence of pneumonia nor pleural effusion nor pneumothorax.   Electronically Signed   By: David  [place]   On: 05/29/2013 09:22     EKG Interpretation   Date/Time:  Friday May 29 2013 07:54:19 EDT Ventricular Rate:  135 PR Interval:  118 QRS Duration: 68 QT Interval:  304 QTC Calculation: 456 R Axis:   70 Text Interpretation:  Sinus  tachycardia RSR' or QR pattern in V1 suggests  right ventricular conduction delay Cannot rule out Anterior infarct , age  undetermined Abnormal ECG Confirmed by Freida BusmanALLEN  MD, ANTHONY ([id number]) on  05/29/2013 10:46:01 AM      MDM   Final diagnoses:  Tachycardia  Hypertension  CHF (congestive heart failure)  Swelling of left lower extremity  Subtherapeutic international normalized ratio (INR)   Pt presents with tachycardia, HTN and worsening lower extremity edema.  Pt reports compliance with HTN medication.  Possible prednisone induced fluid retention.  PE shows 2+ pitting edema to lower extremities, lungs are clear to ascultation, O2 saturation 100% RA.  BNP to assess CHF. Re-evaluation 0940 Re-eval 135/85 HR 130's.  Persistently tachycardic despite lopressor 5 mg. BNP 1527, previous value 91. INR 1.98 slightly sub therapeutic. Negative Troponin. After Dr. Eliot FordAllen's assessment of the pt advises admission for tachycardia and possbile CHF. Will consult for admission for possible early CHF. Discussed patient history, condition, and labs with Dr. Estill DoomsMANZUETA, Tia AlertARLOS [facility] to admit the patient, request cardiology consult. Barnum agrees to consult the patient.  Meds given in ED:  Medications  metoprolol (LOPRESSOR) injection 5 mg (5 mg Intravenous Given 05/29/13 0849)  furosemide (LASIX) injection 60 mg (60 mg Intravenous Given 05/29/13 1000)  metoprolol (LOPRESSOR) injection 2.5 mg (2.5 mg Intravenous Given 05/29/13 1007)    New Prescriptions   No medications on file  Clabe Seal, PA-C 05/30/13 1409

## 2013-05-29 NOTE — Progress Notes (Signed)
Pt received to room 3E11 from ED.  No voiced complaints.  Will page admitting MD for admit orders.  Pt instructed to save all urine for measurement.

## 2013-05-29 NOTE — Progress Notes (Signed)
Chaplain responded to spiritual care consult for advance directive. Patient said he was interested in information on "living will." Chaplain explained Advance Directive documents to patient and left them with him to read and complete at his convenience. Chaplain explained that notary is available Mon-Fri 8:30-4:30 through the Spiritual Care Department. Patient will request chaplain assistance with any questions or when ready to complete documents. Patient said he had no other emotional or spiritual needs at this time. He was grateful for support. Please page for follow up.   Maurene CapesHillary D Irusta [phone]

## 2013-05-29 NOTE — Consult Note (Signed)
Primary Physician: Primary Cardiologist:  Diona Browner    HPI:  Patient is a 63 yo we are asked to see re CHF He has been seen by [facility] in clinic back in December  HIstory of CAD, Atrial flutter (periop) and HTN   He presented to ER complaining of palpitations and SOB  Had LE swelling x 1 wk  BP and heart rate have been elevated for 2 days  Patient has history of CAD (s/p CABG in 2004 (LIMA to LAD; SVG to DIag/OM; SVG to Diag; SVG to PDA) Last cath in 2008:  100% LAD; 50% LCx (dominant); 80% OM2; RCA ok  Grafts patent.  SVG to PDA with 50%  LVEF normal  Patient also has a history of HTN, HL, and atrial flutter (on coumadin)      Past Medical History  Diagnosis Date  . Coronary atherosclerosis of native coronary artery     Multivessel, LVEF 65%  . Superficial thrombophlebitis   . Varicose veins   . Diabetes mellitus, type 2   . Hyperlipidemia   . Chronic back pain   . Essential hypertension, benign   . Atrial fibrillation 2011    Postoperative lap chole  . Atrial flutter 2011    Postoperative lap chole    Medications Prior to Admission  Medication Sig Dispense Refill  . furosemide (LASIX) 40 MG tablet Take 1 tablet (40 mg total) by mouth daily as needed. For fluid retention.  30 tablet  6  . lisinopril (PRINIVIL,ZESTRIL) 40 MG tablet TAKE 1 TABLET EVERY DAY  30 tablet  6  . metFORMIN (GLUCOPHAGE) 500 MG tablet Take 500 mg by mouth 2 (two) times daily.        . metoprolol (LOPRESSOR) 50 MG tablet TAKE 1 TABLET BY MOUTH TWICE A DAY  180 tablet  3  . nitroGLYCERIN (NITROSTAT) 0.4 MG SL tablet Place 0.4 mg under the tongue every 5 (five) minutes as needed. For chest pains.      . pioglitazone (ACTOS) 15 MG tablet Take 15 mg by mouth daily.        Marland Kitchen warfarin (COUMADIN) 5 MG tablet Take 2.5-5 mg by mouth See admin instructions. 2.5 mg ( 0.5 tab )  Monday Wednesday Friday Sunday  and 5 mg on Tuesday Thursday Saturday.           Infusions:   Allergies  Allergen  Reactions  . Shellfish Allergy Anaphylaxis  . Fish Oil Nausea And Vomiting  . Iohexol      Desc: THROAT SWELLING-REQUIRES 13 HR PREP     History   Social History  . Marital Status: Single    Spouse Name: N/A    Number of Children: N/A  . Years of Education: N/A   Occupational History  . Retired since 2008    Social History Main Topics  . Smoking status: Never Smoker   . Smokeless tobacco: Never Used  . Alcohol Use: Yes     Comment: socially  . Drug Use: No  . Sexual Activity: Not on file   Other Topics Concern  . Not on file   Social History Narrative   Lives alone.     No family history on file.  REVIEW OF SYSTEMS:  All systems reviewed  Negative to the above problem except as noted above.    PHYSICAL EXAM: Filed Vitals:   05/29/13 1203  BP: 131/74  Pulse: 133  Temp: 97.6 F (36.4 C)  Resp: 16  Intake/Output Summary (Last 24 hours) at 05/29/13 1318 Last data filed at 05/29/13 1300  Gross per 24 hour  Intake    275 ml  Output    745 ml  Net   -470 ml    General: Patient is in NAD HEENT: normal Neck: supple. no JVD. Carotids 2+ bilat; no bruits. No lymphadenopathy or thryomegaly appreciated. Cor:  Irregular rate & rhythm. No rubs, gallops or murmurs. Lungs: Occas crackles at bases Abdomen: soft, nontender, nondistended. No hepatosplenomegaly. No bruits or masses. Good bowel sounds. Extremities: 2+ edema with erythma Neuro: alert & oriented x 3, cranial nerves grossly intact. moves all 4 extremities w/o difficulty. Affect pleasant.  ECG:  Atrial flutter with 2:1 conduction.  135 bpm  Results for orders placed during the hospital encounter of 05/29/13 (from the past 24 hour(s))  CBC     Status: Abnormal   Collection Time    05/29/13  8:04 AM      Result Value Ref Range   WBC 7.8  4.0 - 10.5 K/uL   RBC 3.67 (*) 4.22 - 5.81 MIL/uL   Hemoglobin 14.0  13.0 - 17.0 g/dL   HCT 16.1  09.6 - 04.5 %   MCV 111.4 (*) 78.0 - 100.0 fL   MCH 38.1 (*)  26.0 - 34.0 pg   MCHC 34.2  30.0 - 36.0 g/dL   RDW 40.9 (*) 81.1 - 91.4 %   Platelets 232  150 - 400 K/uL  BASIC METABOLIC PANEL     Status: Abnormal   Collection Time    05/29/13  8:04 AM      Result Value Ref Range   Sodium 142  137 - 147 mEq/L   Potassium 4.3  3.7 - 5.3 mEq/L   Chloride 99  96 - 112 mEq/L   CO2 25  19 - 32 mEq/L   Glucose, Bld 98  70 - 99 mg/dL   BUN 10  6 - 23 mg/dL   Creatinine, Ser 7.82  0.50 - 1.35 mg/dL   Calcium 9.1  8.4 - 95.6 mg/dL   GFR calc non Af Amer 88 (*) >90 mL/min   GFR calc Af Amer >90  >90 mL/min  PRO B NATRIURETIC PEPTIDE     Status: Abnormal   Collection Time    05/29/13  8:04 AM      Result Value Ref Range   Pro B Natriuretic peptide (BNP) 1527.0 (*) 0 - 125 pg/mL  I-STAT TROPOININ, ED     Status: None   Collection Time    05/29/13  8:25 AM      Result Value Ref Range   Troponin i, poc 0.00  0.00 - 0.08 ng/mL   Comment 3           PROTIME-INR     Status: Abnormal   Collection Time    05/29/13  8:28 AM      Result Value Ref Range   Prothrombin Time 21.9 (*) 11.6 - 15.2 seconds   INR 1.98 (*) 0.00 - 1.49  GLUCOSE, CAPILLARY     Status: None   Collection Time    05/29/13 12:45 PM      Result Value Ref Range   Glucose-Capillary 90  70 - 99 mg/dL   Dg Chest 2 View  05/02/863   CLINICAL DATA:  Tachycardia, history of hypertension  EXAM: CHEST  2 VIEW  COMPARISON:  DG CHEST 2 VIEW dated 11/25/2012  FINDINGS: The lungs are adequately inflated. There is no  focal infiltrate. The interstitial markings are mildly prominent bilaterally but stable. The cardiopericardial silhouette is normal in size. The pulmonary vascularity is not engorged. The patient has undergone previous CABG. There is no pleural effusion or pneumothorax. The trachea is midline. The observed portions of the bony thorax exhibit no acute abnormalities.  IMPRESSION: 1. There is mild stable prominence of the pulmonary interstitial markings in the setting of enlargement of the  cardiac silhouette. The possibility of low-grade compensated CHF is raised. 2. There is no evidence of pneumonia nor pleural effusion nor pneumothorax.   Electronically Signed   By: David  Swazil[place]   On: 05/29/2013 09:22     ASSESSMENT:  Cletis Athensaeint is a 63 yo who presents with symptoms of CHF  Has responded some to 1 dose of lasix  EKG shows atrial flutter with rapid rate.  Tele also shows uncontrolled rates (afib/flutter).  Exam still shows signif volume overload  Patient has history of CAD by s/p CABG 10 years ago.  Patent grafts in 2008. The patients presentation may represent worsening CAD or it may be end effect of uncontrolled heart rates   Recomm:  Echo to evaluate LV function   Continue IV lasix Increase rate controlling meds as bp tolerates.  2.  CAD  As above   3.  HTN  Follow.

## 2013-05-29 NOTE — ED Notes (Signed)
Pt getting into gown

## 2013-05-29 NOTE — ED Provider Notes (Addendum)
Medical screening examination/treatment/procedure(s) were conducted as a shared visit with non-physician practitioner(s) and myself.  I personally evaluated the patient during the encounter.   EKG Interpretation None      Date: 05/29/2013  Rate: 135  Rhythm: sinus tachycardia  QRS Axis: normal  Intervals: normal  ST/T Wave abnormalities: nonspecific ST changes  Conduction Disutrbances:none  Narrative Interpretation:   Old EKG Reviewed: none available  Patient here complaining of tachycardia and bilateral lower extremity swelling. Lungs showed no signs of rales. Heart rate is 133. He denies any chest pain with this. Lower extremities show 3+ pitting edema. Awaiting results of labs and x-rays. Suspect patient could have CHF. Heart rate will be controlled with IV beta blockers. We'll continue to monitor   9:53 AM Patient reassessed remains tachycardic. Blood pressure has improved. BMP and chest x-ray consistent early CHF. Will be given Lasix 60 mg IV push. Will be admitted to cardiology or internal medicine service and  CRITICAL CARE Performed by: Toy BakerALLEN,Remmy Crass T Total critical care time: 45 Critical care time was exclusive of separately billable procedures and treating other patients. Critical care was necessary to treat or prevent imminent or life-threatening deterioration. Critical care was time spent personally by me on the following activities: development of treatment plan with patient and/or surrogate as well as nursing, discussions with consultants, evaluation of patient's response to treatment, examination of patient, obtaining history from patient or surrogate, ordering and performing treatments and interventions, ordering and review of laboratory studies, ordering and review of radiographic studies, pulse oximetry and re-evaluation of patient's condition.    Toy BakerAnthony T Sherman Donaldson, MD 05/29/13 [id number]  Toy BakerAnthony T Brazos Sandoval, MD 05/29/13 [phone]

## 2013-05-29 NOTE — Progress Notes (Signed)
ANTICOAGULATION CONSULT NOTE - Initial Consult  Pharmacy Consult:  Coumadin Indication: atrial fibrillation  Allergies  Allergen Reactions  . Shellfish Allergy Anaphylaxis  . Fish Oil Nausea And Vomiting  . Iohexol      Desc: THROAT SWELLING-REQUIRES 13 HR PREP     Patient Measurements: Height: 5\' 10"  (177.8 cm) Weight: 252 lb 10.4 oz (114.6 kg) (scale C) IBW/kg (Calculated) : 73  Vital Signs: Temp: 97.6 F (36.4 C) (03/13 1520) Temp src: Oral (03/13 1520) BP: 133/67 mmHg (03/13 1520) Pulse Rate: 133 (03/13 1520)  Labs:  Recent Labs  05/29/13 0804 05/29/13 0828  HGB 14.0  --   HCT 40.9  --   PLT 232  --   LABPROT  --  21.9*  INR  --  1.98*  CREATININE 0.89  --     Estimated Creatinine Clearance: 106.3 ml/min (by C-G formula based on Cr of 0.89).   Medical History: Past Medical History  Diagnosis Date  . Coronary atherosclerosis of native coronary artery     Multivessel, LVEF 65%  . Superficial thrombophlebitis   . Varicose veins   . Diabetes mellitus, type 2   . Hyperlipidemia   . Chronic back pain   . Essential hypertension, benign   . Atrial fibrillation 2011    Postoperative lap chole  . Atrial flutter 2011    Postoperative lap chole       Assessment: [age] YOM with history of Afib/Aflutter to continue on Coumadin from PTA.  INR slightly sub-therapeutic on home dose.  No bleeding reported.   Goal of Therapy:  INR 2-3 Monitor platelets by anticoagulation protocol: Yes    Plan:  - Coumadin 4mg  PO today - Daily PT / INR    Ashonte Angelucci D. Laney Potashang, PharmD, BCPS Pager:  [phone] 05/29/2013, 4:05 PM

## 2013-05-29 NOTE — ED Notes (Signed)
PA updated no change on rate.

## 2013-05-29 NOTE — ED Notes (Signed)
Pt reports B/L leg swelling onset x 1 week. Pt reports elevated BP and HR x 2 days. Pt reports currently taking a fluid pill but only takes it as needed. Pt denies Chest pain, shortness of breath or dizziness.

## 2013-05-29 NOTE — Progress Notes (Signed)
Pt's heart rate up to 130's when up to void at bedside and 120s at rest.  Dr. Gwenlyn PerkingMadera sent text page to inform.  Pt asymptomatic - denies chest pain, skin warm and dry, and no shortness of breath.

## 2013-05-30 DIAGNOSIS — I10 Essential (primary) hypertension: Secondary | ICD-10-CM

## 2013-05-30 DIAGNOSIS — R791 Abnormal coagulation profile: Secondary | ICD-10-CM

## 2013-05-30 DIAGNOSIS — I369 Nonrheumatic tricuspid valve disorder, unspecified: Secondary | ICD-10-CM

## 2013-05-30 LAB — CBC
HCT: 39.8 % (ref 39.0–52.0)
Hemoglobin: 13.3 g/dL (ref 13.0–17.0)
MCH: 37.5 pg — ABNORMAL HIGH (ref 26.0–34.0)
MCHC: 33.4 g/dL (ref 30.0–36.0)
MCV: 112.1 fL — ABNORMAL HIGH (ref 78.0–100.0)
Platelets: 219 10*3/uL (ref 150–400)
RBC: 3.55 MIL/uL — ABNORMAL LOW (ref 4.22–5.81)
RDW: 16.1 % — AB (ref 11.5–15.5)
WBC: 5.9 10*3/uL (ref 4.0–10.5)

## 2013-05-30 LAB — BASIC METABOLIC PANEL
BUN: 12 mg/dL (ref 6–23)
CALCIUM: 8.9 mg/dL (ref 8.4–10.5)
CHLORIDE: 98 meq/L (ref 96–112)
CO2: 26 mEq/L (ref 19–32)
CREATININE: 0.95 mg/dL (ref 0.50–1.35)
GFR calc non Af Amer: 86 mL/min — ABNORMAL LOW (ref >90)
Glucose, Bld: 95 mg/dL (ref 70–99)
Potassium: 3.7 mEq/L (ref 3.7–5.3)
Sodium: 142 mEq/L (ref 137–147)

## 2013-05-30 LAB — PROTIME-INR
INR: 1.66 — AB (ref 0.00–1.49)
Prothrombin Time: 19.1 seconds — ABNORMAL HIGH (ref 11.6–15.2)

## 2013-05-30 LAB — LIPID PANEL
Cholesterol: 214 mg/dL — ABNORMAL HIGH (ref 0–200)
HDL: 85 mg/dL (ref >39)
LDL Cholesterol: 111 mg/dL — ABNORMAL HIGH (ref 0–99)
Total CHOL/HDL Ratio: 2.5 RATIO
Triglycerides: 90 mg/dL (ref <150)
VLDL: 18 mg/dL (ref 0–40)

## 2013-05-30 LAB — GLUCOSE, CAPILLARY
GLUCOSE-CAPILLARY: 91 mg/dL (ref 70–99)
GLUCOSE-CAPILLARY: 161 mg/dL — AB (ref 70–99)
Glucose-Capillary: 119 mg/dL — ABNORMAL HIGH (ref 70–99)
Glucose-Capillary: 123 mg/dL — ABNORMAL HIGH (ref 70–99)

## 2013-05-30 LAB — TROPONIN I

## 2013-05-30 MED ORDER — OXYCODONE HCL 5 MG PO TABS
5.0000 mg | ORAL_TABLET | Freq: Once | ORAL | Status: AC
  Administered 2013-05-30: 5 mg via ORAL
  Filled 2013-05-30: qty 1

## 2013-05-30 MED ORDER — METOPROLOL TARTRATE 100 MG PO TABS
100.0000 mg | ORAL_TABLET | Freq: Two times a day (BID) | ORAL | Status: DC
Start: 2013-05-30 — End: 2013-06-01
  Administered 2013-05-30 – 2013-06-01 (×5): 100 mg via ORAL
  Filled 2013-05-30 (×6): qty 1

## 2013-05-30 MED ORDER — PREDNISONE 20 MG PO TABS
20.0000 mg | ORAL_TABLET | Freq: Every day | ORAL | Status: DC
  Administered 2013-05-30 – 2013-06-01 (×3): 20 mg via ORAL
  Filled 2013-05-30 (×4): qty 1

## 2013-05-30 MED ORDER — SIMVASTATIN 20 MG PO TABS
20.0000 mg | ORAL_TABLET | Freq: Every day | ORAL | Status: DC
  Filled 2013-05-30: qty 1

## 2013-05-30 MED ORDER — WARFARIN SODIUM 7.5 MG PO TABS
7.5000 mg | ORAL_TABLET | Freq: Once | ORAL | Status: AC
  Administered 2013-05-30: 7.5 mg via ORAL
  Filled 2013-05-30: qty 1

## 2013-05-30 MED ORDER — ATORVASTATIN CALCIUM 10 MG PO TABS
10.0000 mg | ORAL_TABLET | Freq: Every day | ORAL | Status: DC
  Administered 2013-05-30: 10 mg via ORAL
  Filled 2013-05-30 (×3): qty 1

## 2013-05-30 MED ORDER — INDOMETHACIN 50 MG PO CAPS
50.0000 mg | ORAL_CAPSULE | Freq: Once | ORAL | Status: AC
  Administered 2013-05-30: 50 mg via ORAL
  Filled 2013-05-30: qty 1

## 2013-05-30 MED ORDER — ENOXAPARIN SODIUM 100 MG/ML ~~LOC~~ SOLN
100.0000 mg | Freq: Two times a day (BID) | SUBCUTANEOUS | Status: DC
  Administered 2013-05-30 – 2013-06-01 (×5): 100 mg via SUBCUTANEOUS
  Filled 2013-05-30 (×8): qty 1

## 2013-05-30 MED ORDER — DILTIAZEM HCL ER COATED BEADS 120 MG PO TB24
120.0000 mg | ORAL_TABLET | Freq: Every day | ORAL | Status: DC
  Administered 2013-05-30 – 2013-06-01 (×3): 120 mg via ORAL
  Filled 2013-05-30 (×4): qty 1

## 2013-05-30 MED ORDER — LEVOTHYROXINE SODIUM 75 MCG PO TABS
75.0000 ug | ORAL_TABLET | Freq: Every day | ORAL | Status: DC
  Administered 2013-05-30 – 2013-06-01 (×3): 75 ug via ORAL
  Filled 2013-05-30 (×5): qty 1

## 2013-05-30 MED ORDER — DILTIAZEM HCL 25 MG/5ML IV SOLN
10.0000 mg | Freq: Once | INTRAVENOUS | Status: AC
  Administered 2013-05-30: 10 mg via INTRAVENOUS
  Filled 2013-05-30: qty 5

## 2013-05-30 NOTE — Progress Notes (Signed)
The patient's HR is sustaining at 135.  M. Lynch notified.  Orders were given to go ahead and give the scheduled PO metoprolol.  The RN will carry out the orders.

## 2013-05-30 NOTE — Progress Notes (Signed)
Echo Lab  2D Echocardiogram completed.  Oluwatimileyin Vivier L Destenie Ingber, RDCS 05/30/2013 8:51 AM

## 2013-05-30 NOTE — Progress Notes (Signed)
The patient's HR is between 105-120.  He is in A. Flutter at this time.  The RN will continue to monitor the patient.

## 2013-05-30 NOTE — Progress Notes (Signed)
DAILY PROGRESS NOTE  Subjective:  Breathing is good. Swelling has improved. Is in a-fib with RVR. Will need better rate control.  Echo today shows low normal EF of 50-55%.  He is at high risk for OSA and should have an outpatient sleep study.  Objective:  Temp:  [97.6 F (36.4 C)-98.3 F (36.8 C)] 98.3 F (36.8 C) (03/14 0456) Pulse Rate:  [122-134] 122 (03/14 0456) Resp:  [16-32] 18 (03/14 0456) BP: (131-144)/(58-91) 139/58 mmHg (03/14 0919) SpO2:  [92 %-100 %] 97 % (03/14 0456) Weight:  [246 lb (111.585 kg)-252 lb 10.4 oz (114.6 kg)] 246 lb (111.585 kg) (03/14 0456) Weight change:   Intake/Output from previous day: 03/13 0701 - 03/14 0700 In: 1235 [P.O.:960; I.V.:275] Out: 3270 [Urine:3270]  Intake/Output from this shift: Total I/O In: -  Out: 500 [Urine:500]  Medications: Current Facility-Administered Medications  Medication Dose Route Frequency Provider Last Rate Last Dose  . 0.9 %  sodium chloride infusion  250 mL Intravenous PRN Zerita Boers, MD      . enoxaparin (LOVENOX) injection 100 mg  100 mg Subcutaneous Q12H Zerita Boers, MD      . furosemide (LASIX) injection 40 mg  40 mg Intravenous Q12H Zerita Boers, MD   40 mg at 05/30/13 0439  . insulin aspart (novoLOG) injection 0-9 Units  0-9 Units Subcutaneous TID WC Zerita Boers, MD      . insulin detemir (LEVEMIR) injection 5 Units  5 Units Subcutaneous QHS Zerita Boers, MD      . lisinopril (PRINIVIL,ZESTRIL) tablet 40 mg  40 mg Oral Daily Zerita Boers, MD   40 mg at 05/30/13 0919  . metoprolol (LOPRESSOR) tablet 100 mg  100 mg Oral BID Zerita Boers, MD   100 mg at 05/30/13 0920  . pantoprazole (PROTONIX) EC tablet 40 mg  40 mg Oral Q1200 Zerita Boers, MD      . potassium chloride SA (K-DUR,KLOR-CON) CR tablet 20 mEq  20 mEq Oral Daily Zerita Boers, MD   20 mEq at 05/30/13 0920  . simvastatin (ZOCOR) tablet  20 mg  20 mg Oral q1800 Zerita Boers, MD      . sodium chloride 0.9 % injection 3 mL  3 mL Intravenous Q12H Zerita Boers, MD   3 mL at 05/29/13 2218  . sodium chloride 0.9 % injection 3 mL  3 mL Intravenous PRN Zerita Boers, MD   3 mL at 05/30/13 0920  . warfarin (COUMADIN) tablet 7.5 mg  7.5 mg Oral ONCE-1800 Burna Cash, RPH      . Warfarin - Pharmacist Dosing Inpatient   Does not apply q1800 Saundra Shelling, [facility]        Physical Exam: General appearance: alert and no distress Neck: no carotid bruit, no JVD and thick Lungs: diminished breath sounds bilaterally Heart: irregularly irregular rhythm Extremities: edema 2+ bilateral LE and venous stasis dermatitis noted Skin: erythema and stasis dermatitis of the LE  Lab Results: Results for orders placed during the hospital encounter of 05/29/13 (from the past 48 hour(s))  CBC     Status: Abnormal   Collection Time    05/29/13  8:04 AM      Result Value Ref Range   WBC 7.8  4.0 - 10.5 K/uL   RBC 3.67 (*) 4.22 - 5.81 MIL/uL   Hemoglobin 14.0  13.0 -  17.0 g/dL   HCT 40.9  39.0 - 52.0 %   MCV 111.4 (*) 78.0 - 100.0 fL   MCH 38.1 (*) 26.0 - 34.0 pg   MCHC 34.2  30.0 - 36.0 g/dL   RDW 15.7 (*) 11.5 - 15.5 %   Platelets 232  150 - 400 K/uL  BASIC METABOLIC PANEL     Status: Abnormal   Collection Time    05/29/13  8:04 AM      Result Value Ref Range   Sodium 142  137 - 147 mEq/L   Potassium 4.3  3.7 - 5.3 mEq/L   Chloride 99  96 - 112 mEq/L   CO2 25  19 - 32 mEq/L   Glucose, Bld 98  70 - 99 mg/dL   BUN 10  6 - 23 mg/dL   Creatinine, Ser 0.89  0.50 - 1.35 mg/dL   Calcium 9.1  8.4 - 10.5 mg/dL   GFR calc non Af Amer 88 (*) >90 mL/min   GFR calc Af Amer >90  >90 mL/min   Comment: (NOTE)     The eGFR has been calculated using the CKD EPI equation.     This calculation has not been validated in all clinical situations.     eGFR's persistently <90 mL/min signify possible Chronic Kidney     Disease.    PRO B NATRIURETIC PEPTIDE     Status: Abnormal   Collection Time    05/29/13  8:04 AM      Result Value Ref Range   Pro B Natriuretic peptide (BNP) 1527.0 (*) 0 - 125 pg/mL  I-STAT TROPOININ, ED     Status: None   Collection Time    05/29/13  8:25 AM      Result Value Ref Range   Troponin i, poc 0.00  0.00 - 0.08 ng/mL   Comment 3            Comment: Due to the release kinetics of cTnI,     a negative result within the first hours     of the onset of symptoms does not rule out     myocardial infarction with certainty.     If myocardial infarction is still suspected,     repeat the test at appropriate intervals.  PROTIME-INR     Status: Abnormal   Collection Time    05/29/13  8:28 AM      Result Value Ref Range   Prothrombin Time 21.9 (*) 11.6 - 15.2 seconds   INR 1.98 (*) 0.00 - 1.49  GLUCOSE, CAPILLARY     Status: None   Collection Time    05/29/13 12:45 PM      Result Value Ref Range   Glucose-Capillary 90  70 - 99 mg/dL  TSH     Status: Abnormal   Collection Time    05/29/13  4:17 PM      Result Value Ref Range   TSH 6.071 (*) 0.350 - 4.500 uIU/mL   Comment: Performed at [facility]  TROPONIN I     Status: None   Collection Time    05/29/13  4:17 PM      Result Value Ref Range   Troponin I <0.30  <0.30 ng/mL   Comment:            Due to the release kinetics of cTnI,     a negative result within the first hours     of the onset of symptoms does not  rule out     myocardial infarction with certainty.     If myocardial infarction is still suspected,     repeat the test at appropriate intervals.  HEMOGLOBIN A1C     Status: Abnormal   Collection Time    05/29/13  4:17 PM      Result Value Ref Range   Hemoglobin A1C 6.0 (*) <5.7 %   Comment: (NOTE)                                                                               According to the ADA Clinical Practice Recommendations for 2011, when     HbA1c is used as a screening test:      >=6.5%    Diagnostic of Diabetes Mellitus               (if abnormal result is confirmed)     5.7-6.4%   Increased risk of developing Diabetes Mellitus     References:Diagnosis and Classification of Diabetes Mellitus,Diabetes     ZOXW,9604,54(UJWJX 1):S62-S69 and Standards of Medical Care in             Diabetes - 2011,Diabetes Care,2011,34 (Suppl 1):S11-S61.   Mean Plasma Glucose 126 (*) <117 mg/dL   Comment: Performed at [place], CAPILLARY     Status: Abnormal   Collection Time    05/29/13  4:30 PM      Result Value Ref Range   Glucose-Capillary 104 (*) 70 - 99 mg/dL  GLUCOSE, CAPILLARY     Status: None   Collection Time    05/29/13  9:24 PM      Result Value Ref Range   Glucose-Capillary 93  70 - 99 mg/dL  TROPONIN I     Status: None   Collection Time    05/29/13 10:07 PM      Result Value Ref Range   Troponin I <0.30  <0.30 ng/mL   Comment:            Due to the release kinetics of cTnI,     a negative result within the first hours     of the onset of symptoms does not rule out     myocardial infarction with certainty.     If myocardial infarction is still suspected,     repeat the test at appropriate intervals.  TROPONIN I     Status: None   Collection Time    05/30/13  5:23 AM      Result Value Ref Range   Troponin I <0.30  <0.30 ng/mL   Comment:            Due to the release kinetics of cTnI,     a negative result within the first hours     of the onset of symptoms does not rule out     myocardial infarction with certainty.     If myocardial infarction is still suspected,     repeat the test at appropriate intervals.  BASIC METABOLIC PANEL     Status: Abnormal   Collection Time    05/30/13  5:23 AM      Result Value Ref Range   Sodium  142  137 - 147 mEq/L   Potassium 3.7  3.7 - 5.3 mEq/L   Chloride 98  96 - 112 mEq/L   CO2 26  19 - 32 mEq/L   Glucose, Bld 95  70 - 99 mg/dL   BUN 12  6 - 23 mg/dL   Creatinine, Ser 0.95  0.50 - 1.35 mg/dL    Calcium 8.9  8.4 - 10.5 mg/dL   GFR calc non Af Amer 86 (*) >90 mL/min   GFR calc Af Amer >90  >90 mL/min   Comment: (NOTE)     The eGFR has been calculated using the CKD EPI equation.     This calculation has not been validated in all clinical situations.     eGFR's persistently <90 mL/min signify possible Chronic Kidney     Disease.  CBC     Status: Abnormal   Collection Time    05/30/13  5:23 AM      Result Value Ref Range   WBC 5.9  4.0 - 10.5 K/uL   RBC 3.55 (*) 4.22 - 5.81 MIL/uL   Hemoglobin 13.3  13.0 - 17.0 g/dL   HCT 39.8  39.0 - 52.0 %   MCV 112.1 (*) 78.0 - 100.0 fL   MCH 37.5 (*) 26.0 - 34.0 pg   MCHC 33.4  30.0 - 36.0 g/dL   RDW 16.1 (*) 11.5 - 15.5 %   Platelets 219  150 - 400 K/uL  PROTIME-INR     Status: Abnormal   Collection Time    05/30/13  5:23 AM      Result Value Ref Range   Prothrombin Time 19.1 (*) 11.6 - 15.2 seconds   INR 1.66 (*) 0.00 - 1.49  LIPID PANEL     Status: Abnormal   Collection Time    05/30/13  5:23 AM      Result Value Ref Range   Cholesterol 214 (*) 0 - 200 mg/dL   Triglycerides 90  <150 mg/dL   HDL 85  >39 mg/dL   Total CHOL/HDL Ratio 2.5     VLDL 18  0 - 40 mg/dL   LDL Cholesterol 111 (*) 0 - 99 mg/dL   Comment:            Total Cholesterol/HDL:CHD Risk     Coronary Heart Disease Risk Table                         Men   Women      1/2 Average Risk   3.4   3.3      Average Risk       5.0   4.4      2 X Average Risk   9.6   7.1      3 X Average Risk  23.4   11.0                Use the calculated Patient Ratio     above and the CHD Risk Table     to determine the patient's CHD Risk.                ATP III CLASSIFICATION (LDL):      <100     mg/dL   Optimal      100-129  mg/dL   Near or Above                        Optimal  130-159  mg/dL   Borderline      160-189  mg/dL   High      >190     mg/dL   Very High  GLUCOSE, CAPILLARY     Status: None   Collection Time    05/30/13  6:01 AM      Result Value Ref Range    Glucose-Capillary 91  70 - 99 mg/dL    Imaging: Dg Chest 2 View  05/29/2013   CLINICAL DATA:  Tachycardia, history of hypertension  EXAM: CHEST  2 VIEW  COMPARISON:  DG CHEST 2 VIEW dated 11/25/2012  FINDINGS: The lungs are adequately inflated. There is no focal infiltrate. The interstitial markings are mildly prominent bilaterally but stable. The cardiopericardial silhouette is normal in size. The pulmonary vascularity is not engorged. The patient has undergone previous CABG. There is no pleural effusion or pneumothorax. The trachea is midline. The observed portions of the bony thorax exhibit no acute abnormalities.  IMPRESSION: 1. There is mild stable prominence of the pulmonary interstitial markings in the setting of enlargement of the cardiac silhouette. The possibility of low-grade compensated CHF is raised. 2. There is no evidence of pneumonia nor pleural effusion nor pneumothorax.   Electronically Signed   By: David  Martinique   On: 05/29/2013 09:22    Assessment:  1. Principal Problem: 2.   Acute on chronic diastolic heart failure 3. Active Problems: 4.   Mixed hyperlipidemia 5.   Essential hypertension, benign 6.   Atrial flutter 7.   Tachycardia 8.   Lower extremity edema 9. Venous stasis dermatitis  Plan:  1. -2L net negative yesterday. Continue IV lasix today. Will start cardizem LA 120 mg daily today for better rate control.  Suspect he will need TEE/Cardioversion on Monday if he does not convert with better rate control.  He recently had gout and was on steroids, which may have contributed to this.  His atria are only mildly dilated, therefore he may not need anti-arrythmic therapy.  Time Spent Directly with Patient:  15 minutes  Length of Stay:  LOS: 1 day   Pixie Casino, MD, [facility] Attending Cardiologist [facility]  Jaretzi Droz C 05/30/2013, 10:23 AM

## 2013-05-30 NOTE — Progress Notes (Signed)
The patient's HR was sustaining in the lower 140s.  An EKG was taken and placed in the patient's chart.  The patient is asymptomatic.  The EKG read SVT, but the patient appears to be in A. Fib/A. Flutter.  Daphane ShepherdM. Lynch was notified.  New orders were given to give the patient 5mg  of IV metoprolol.  The RN will carry out the order and continue to monitor the patient.

## 2013-05-30 NOTE — Progress Notes (Signed)
The patient's HR is back up into the 120s-130s sustaining.  Daphane ShepherdM. Lynch notified.  New orders given to give 10 mg of IV Cardizem.  The RN will carry out the orders and will continue to monitor the patient.

## 2013-05-30 NOTE — Progress Notes (Addendum)
ANTICOAGULATION CONSULT NOTE - Follow-up Consult  Pharmacy Consult:  Coumadin + Lovenox  Indication: atrial fibrillation  Allergies  Allergen Reactions  . Shellfish Allergy Anaphylaxis  . Fish Oil Nausea And Vomiting  . Iohexol      Desc: THROAT SWELLING-REQUIRES 13 HR PREP     Patient Measurements: Height: 5\' 10"  (177.8 cm) Weight: 246 lb (111.585 kg) IBW/kg (Calculated) : 73  Vital Signs: Temp: 98.3 F (36.8 C) (03/14 0456) Temp src: Oral (03/14 0456) BP: 144/85 mmHg (03/14 0456) Pulse Rate: 122 (03/14 0456)  Labs:  Recent Labs  05/29/13 0804 05/29/13 0828 05/29/13 1617 05/29/13 2207 05/30/13 0523  HGB 14.0  --   --   --  13.3  HCT 40.9  --   --   --  39.8  PLT 232  --   --   --  219  LABPROT  --  21.9*  --   --  19.1*  INR  --  1.98*  --   --  1.66*  CREATININE 0.89  --   --   --  0.95  TROPONINI  --   --  <0.30 <0.30 <0.30    Estimated Creatinine Clearance: 98.2 ml/min (by C-G formula based on Cr of 0.95).   Medical History: Past Medical History  Diagnosis Date  . Coronary atherosclerosis of native coronary artery     Multivessel, LVEF 65%  . Superficial thrombophlebitis   . Varicose veins   . Diabetes mellitus, type 2   . Hyperlipidemia   . Chronic back pain   . Essential hypertension, benign   . Atrial fibrillation 2011    Postoperative lap chole  . Atrial flutter 2011    Postoperative lap chole    Assessment: [age] YOM presents 3/13 with acute on chronic diastolic heart failure to continue on Coumadin from PTA for Afib/aflutter. INR slightly sub-therapeutic 1.98 on home dose, now 1.66. No bleeding reported. Pt remains in Afib with sustained elevated HR, per Dr. Gwenlyn PerkingMadera will give enoxaparin for interim bridging until therapeutic; possible cardioversion in the future. CBC stable.    Coumadin PTA dose 2.5mg  daily except 5mg  TTS  Goal of Therapy:  INR 2-3 Monitor platelets by anticoagulation protocol: Yes   Plan:  - Coumadin 7.5mg  PO x 1  tonight  - Lovenox 100mg  sq Q12h - Daily PT / INR - D/C Lovenox when INR > 2  Odessia Asleson B. Artelia Larocheung, PharmD Clinical Pharmacist - Resident Phone: [phone] Pager: [phone] 05/30/2013 9:07 AM

## 2013-05-30 NOTE — Progress Notes (Signed)
[facility] PROGRESS NOTE Interim History: 63 y.o. male with pmh significant for DM type 2, atrial flutter/atrial fibrillation (on coumadin), varicose veins/venous insufficiency, HLD and HTN; came to ED complaining of worsening LE edema and mild SOB with exertion. Patient reports symptoms has been present for the last week or so and worsening. Patient denies CP, fever, cough, chills, nausea, vomiting, dysuria, hematemesis, melena or hematochezia. In ED was found to have HR in the 130-140's, #++ LE edema, elevated BNP and CXR with enlarged cardiac silhouette and vascular congestion. [facility] called to admit patient for CHF exacerbation.   Filed Weights   05/29/13 0757 05/29/13 1203 05/30/13 0456  Weight: 113.399 kg (250 lb) 114.6 kg (252 lb 10.4 oz) 111.585 kg (246 lb)        Intake/Output Summary (Last 24 hours) at 05/30/13 1601 Last data filed at 05/30/13 1300  Gross per 24 hour  Intake   1200 ml  Output   2825 ml  Net  -1625 ml     Assessment/Plan: 1-Acute on chronic diastolic heart failure: patient denies CP or palpitations; but HR is elevated in the 130-140's  -keep patient on tele  -strict I's and O's  -daily weight  -lasix IV 40mg  BID  -continue metoprolol (dose adjusted to 100mg  BID)  -TSH elevated (6.071) -cardiology has been consulted; will follow rec's  -2-D echo demonstrating diastolic heart fialure; preserved EF, no wall motion abnormalities -CE'z neg  2-Mixed hyperlipidemia: will check lipid panel. If needed will start statins   3-Essential hypertension, benign: much better -continue iv lasix -started on cardizem 120mg  daily -metoprolol 100 mg BID -will follow low sodium diet   4-Atrial flutter/atrial fibrillation:  -metoprolol dose adjusted.  -continue coumadin per pharmacy; since INR has remained subtherapeutic will bridge with lovenox -cardiology consulted; will follow rec's  -cardizem 120 now on board.  5-Lower extremity edema: due to CHF on top of  underlying venous insufficiency/stasis disease.  -continue lasix IV  -swelling improving.  6-DM type 2: will hold actos (especially with ongoing swelling) and metformin  -A1C  6.0 -start SSI and low dose levemir   7-GERD: PPI  8-Gout: with acute flare from lasix most likely -will start prednisone  -will benefit of allopurinol therapy  9-abnormal TSH: will check free t4 -start repletion  Code Status: Full Family Communication: no family at bedside  Disposition Plan: home when medically stable   Consultants:  Cardiology   Procedures: ECHO:  - Left ventricle: The cavity size was normal. Wall thickness was normal. Systolic function was normal. The estimated ejection fraction was in the range of 50% to 55%. Incoordinate septal motion. The study is not technically sufficient to allow evaluation of LV diastolic function. - Mitral valve: Mild regurgitation. - Left atrium: The atrium was mildly dilated. (21 cm2). - Right atrium: The atrium was mildly dilated (20 cm2). - Tricuspid valve: Moderate regurgitation. - Pulmonary arteries: PA peak pressure: 37mm Hg (S). - Inferior vena cava: The vessel was dilated; the respirophasic diameter changes were blunted (< 50%); findings are consistent with elevated central venous pressure. - Pericardium, extracardiac: There was no pericardial effusion.  Antibiotics: None   HPI/Subjective: No CP; breathing ok. Reports Le edema is improving. Still tachcycardic   Objective: Filed Vitals:   05/29/13 2015 05/30/13 0456 05/30/13 0919 05/30/13 1424  BP: 134/70 144/85 139/58 128/78  Pulse:  122  111  Temp:  98.3 F (36.8 C)  98.3 F (36.8 C)  TempSrc:  Oral  Oral  Resp:  18  18  Height:      Weight:  111.585 kg (246 lb)    SpO2:  97%  96%     Exam:  General: Alert, awake, oriented x3, in no acute distress.  HEENT: No bruits, no goiter. No JVD Heart: irregular, irregular; no rubs or gallops. 2++ bilaterally LE edema Lungs: Good  air movement, no frank crackles  Abdomen: Soft, nontender, nondistended, positive bowel sounds.  MSK: positive swelling on his right hand due to gout; warm and tender to palpation Neuro: Grossly intact, nonfocal.   Data Reviewed: Basic Metabolic Panel:  Recent Labs Lab 05/29/13 0804 05/30/13 0523  NA 142 142  K 4.3 3.7  CL 99 98  CO2 25 26  GLUCOSE 98 95  BUN 10 12  CREATININE 0.89 0.95  CALCIUM 9.1 8.9   CBC:  Recent Labs Lab 05/29/13 0804 05/30/13 0523  WBC 7.8 5.9  HGB 14.0 13.3  HCT 40.9 39.8  MCV 111.4* 112.1*  PLT 232 219   Cardiac Enzymes:  Recent Labs Lab 05/29/13 1617 05/29/13 2207 05/30/13 0523  TROPONINI <0.30 <0.30 <0.30   BNP (last 3 results)  Recent Labs  05/29/13 0804  PROBNP 1527.0*   CBG:  Recent Labs Lab 05/29/13 1245 05/29/13 1630 05/29/13 2124 05/30/13 0601 05/30/13 1143  GLUCAP 90 104* 93 91 119*    Studies: Dg Chest 2 View  05/29/2013   CLINICAL DATA:  Tachycardia, history of hypertension  EXAM: CHEST  2 VIEW  COMPARISON:  DG CHEST 2 VIEW dated 11/25/2012  FINDINGS: The lungs are adequately inflated. There is no focal infiltrate. The interstitial markings are mildly prominent bilaterally but stable. The cardiopericardial silhouette is normal in size. The pulmonary vascularity is not engorged. The patient has undergone previous CABG. There is no pleural effusion or pneumothorax. The trachea is midline. The observed portions of the bony thorax exhibit no acute abnormalities.  IMPRESSION: 1. There is mild stable prominence of the pulmonary interstitial markings in the setting of enlargement of the cardiac silhouette. The possibility of low-grade compensated CHF is raised. 2. There is no evidence of pneumonia nor pleural effusion nor pneumothorax.   Electronically Signed   By: David  [email or web address]   On: 05/29/2013 09:22    Scheduled Meds: . atorvastatin  10 mg Oral q1800  . diltiazem  120 mg Oral Daily  . enoxaparin (LOVENOX) injection   100 mg Subcutaneous Q12H  . furosemide  40 mg Intravenous Q12H  . insulin aspart  0-9 Units Subcutaneous TID WC  . insulin detemir  5 Units Subcutaneous QHS  . lisinopril  40 mg Oral Daily  . metoprolol  100 mg Oral BID  . pantoprazole  40 mg Oral Q1200  . potassium chloride  20 mEq Oral Daily  . [START ON 05/31/2013] predniSONE  20 mg Oral Q breakfast  . sodium chloride  3 mL Intravenous Q12H  . warfarin  7.5 mg Oral ONCE-1800  . Warfarin - Pharmacist Dosing Inpatient   Does not apply q1800   Continuous Infusions:    Armen PickupManzueta, Franck Vinal E Jossie Smoot  [facility] Pager [phone]. If 8PM-8AM, please contact night-coverage at [email or web address], password [email or web address] 05/30/2013, 4:01 PM  LOS: 1 day

## 2013-05-31 DIAGNOSIS — Z7901 Long term (current) use of anticoagulants: Secondary | ICD-10-CM

## 2013-05-31 DIAGNOSIS — M109 Gout, unspecified: Secondary | ICD-10-CM

## 2013-05-31 LAB — BASIC METABOLIC PANEL
BUN: 19 mg/dL (ref 6–23)
CHLORIDE: 95 meq/L — AB (ref 96–112)
CO2: 27 mEq/L (ref 19–32)
Calcium: 9.1 mg/dL (ref 8.4–10.5)
Creatinine, Ser: 1.21 mg/dL (ref 0.50–1.35)
GFR calc Af Amer: 71 mL/min — ABNORMAL LOW (ref >90)
GFR, EST NON AFRICAN AMERICAN: 62 mL/min — AB (ref >90)
Glucose, Bld: 101 mg/dL — ABNORMAL HIGH (ref 70–99)
Potassium: 3.7 mEq/L (ref 3.7–5.3)
Sodium: 141 mEq/L (ref 137–147)

## 2013-05-31 LAB — PRO B NATRIURETIC PEPTIDE: PRO B NATRI PEPTIDE: 1696 pg/mL — AB (ref 0–125)

## 2013-05-31 LAB — GLUCOSE, CAPILLARY
GLUCOSE-CAPILLARY: 112 mg/dL — AB (ref 70–99)
Glucose-Capillary: 192 mg/dL — ABNORMAL HIGH (ref 70–99)
Glucose-Capillary: 133 mg/dL — ABNORMAL HIGH (ref 70–99)

## 2013-05-31 LAB — T4, FREE: FREE T4: 1.14 ng/dL (ref 0.80–1.80)

## 2013-05-31 LAB — PROTIME-INR
INR: 1.98 — ABNORMAL HIGH (ref 0.00–1.49)
Prothrombin Time: 21.9 seconds — ABNORMAL HIGH (ref 11.6–15.2)

## 2013-05-31 MED ORDER — WARFARIN SODIUM 7.5 MG PO TABS
7.5000 mg | ORAL_TABLET | Freq: Once | ORAL | Status: AC
  Administered 2013-05-31: 7.5 mg via ORAL
  Filled 2013-05-31: qty 1

## 2013-05-31 MED ORDER — OXYCODONE HCL 5 MG PO TABS
5.0000 mg | ORAL_TABLET | Freq: Once | ORAL | Status: AC
  Administered 2013-05-31: 5 mg via ORAL
  Filled 2013-05-31: qty 1

## 2013-05-31 MED ORDER — INDOMETHACIN 50 MG PO CAPS
50.0000 mg | ORAL_CAPSULE | Freq: Two times a day (BID) | ORAL | Status: AC
  Administered 2013-05-31 – 2013-06-01 (×2): 50 mg via ORAL
  Filled 2013-05-31 (×2): qty 1

## 2013-05-31 MED ORDER — SODIUM CHLORIDE 0.9 % IV SOLN: INTRAVENOUS | Status: DC

## 2013-05-31 MED ORDER — ATORVASTATIN CALCIUM 20 MG PO TABS
20.0000 mg | ORAL_TABLET | Freq: Every day | ORAL | Status: DC
  Administered 2013-05-31: 20 mg via ORAL
  Filled 2013-05-31 (×2): qty 1

## 2013-05-31 MED ORDER — CELECOXIB 200 MG PO CAPS
800.0000 mg | ORAL_CAPSULE | Freq: Once | ORAL | Status: DC
  Filled 2013-05-31: qty 4

## 2013-05-31 NOTE — Progress Notes (Signed)
[facility] PROGRESS NOTE Interim History: 63 y.o. male with pmh significant for DM type 2, atrial flutter/atrial fibrillation (on coumadin), varicose veins/venous insufficiency, HLD and HTN; came to ED complaining of worsening LE edema and mild SOB with exertion. Patient reports symptoms has been present for the last week or so and worsening. Patient denies CP, fever, cough, chills, nausea, vomiting, dysuria, hematemesis, melena or hematochezia. In ED was found to have HR in the 130-140's, 3++ LE edema, elevated BNP and CXR with enlarged cardiac silhouette and vascular congestion. [facility] called to admit patient for CHF exacerbation.   Filed Weights   05/29/13 1203 05/30/13 0456 05/31/13 0527  Weight: 114.6 kg (252 lb 10.4 oz) 111.585 kg (246 lb) 109.8 kg (242 lb 1 oz)        Intake/Output Summary (Last 24 hours) at 05/31/13 1158 Last data filed at 05/31/13 0908  Gross per 24 hour  Intake    920 ml  Output   1150 ml  Net   -230 ml     Assessment/Plan: 1-Acute on chronic diastolic heart failure: patient denies CP or palpitations;has remained on A. Flutter/A. Fib; HR now in the 70's -keep patient on tele  -strict I's and O's  -daily weight  -continue lasix IV 40mg  BID  -continue metoprolol (dose adjusted to 100mg  BID)  -TSH elevated (6.071) -cardiology has been consulted; will follow rec's  -2-D echo demonstrating diastolic heart fialure; preserved EF, no wall motion abnormalities -CE'z neg -per cardiology plan is for cardioversion in am (3/16)  2-Mixed hyperlipidemia: LDL 111 -will start lipitor 20mg   3-Essential hypertension, benign: much better -continue iv lasix -continue cardizem 120mg  daily -metoprolol 100 mg BID -low sodium diet   4-Atrial flutter/atrial fibrillation:  -metoprolol dose adjusted.  -continue coumadin per pharmacy; since INR has remained subtherapeutic will bridge with lovenox -cardiology consulted; will follow rec's  -cardizem 120 now on  board. -plan is for cardioversion in am(3/16)  5-Lower extremity edema: due to CHF on top of underlying venous insufficiency/stasis disease.  -continue lasix IV  -swelling improving.  6-DM type 2: will hold actos (especially with ongoing swelling) and metformin  -A1C  6.0 -continue SSI and low dose levemir   7-GERD: PPI  8-Gout: with acute flare from lasix most likely -will continue prednisone  -will benefit of allopurinol therapy as an outpatient -complaining of pain; will use indocin X 2  9-abnormal TSH: will check free t4 -start repletion  Code Status: Full Family Communication: no family at bedside  Disposition Plan: home when medically stable   Consultants:  Cardiology   Procedures: ECHO:  - Left ventricle: The cavity size was normal. Wall thickness was normal. Systolic function was normal. The estimated ejection fraction was in the range of 50% to 55%. Incoordinate septal motion. The study is not technically sufficient to allow evaluation of LV diastolic function. - Mitral valve: Mild regurgitation. - Left atrium: The atrium was mildly dilated. (21 cm2). - Right atrium: The atrium was mildly dilated (20 cm2). - Tricuspid valve: Moderate regurgitation. - Pulmonary arteries: PA peak pressure: 37mm Hg (S). - Inferior vena cava: The vessel was dilated; the respirophasic diameter changes were blunted (< 50%); findings are consistent with elevated central venous pressure. - Pericardium, extracardiac: There was no pericardial effusion.  Antibiotics: None   HPI/Subjective: No CP; breathing ok. Reports Le edema is improving. Tachycardia resolved; but patient still on atrial flutter/A. Fib. BNP elevated  Objective: Filed Vitals:   05/30/13 4098 05/30/13 1424 05/30/13 2110 05/31/13 1191  BP: 139/58 128/78 132/69 139/78  Pulse:  111 89 89  Temp:  98.3 F (36.8 C) 98.1 F (36.7 C) 97.7 F (36.5 C)  TempSrc:  Oral Oral Oral  Resp:  18 18 18   Height:        Weight:    109.8 kg (242 lb 1 oz)  SpO2:  96% 98% 99%     Exam:  General: Alert, awake, oriented x3, in no acute distress.  HEENT: No bruits, no goiter. No JVD Heart: irregular, irregular; no rubs or gallops. 2++ bilaterally LE edema Lungs: Good air movement, no frank crackles  Abdomen: Soft, nontender, nondistended, positive bowel sounds.  MSK: positive swelling on his right hand due to gout; warm and tender to palpation Neuro: Grossly intact, nonfocal.   Data Reviewed: Basic Metabolic Panel:  Recent Labs Lab 05/29/13 0804 05/30/13 0523 05/31/13 0430  NA 142 142 141  K 4.3 3.7 3.7  CL 99 98 95*  CO2 25 26 27   GLUCOSE 98 95 101*  BUN 10 12 19   CREATININE 0.89 0.95 1.21  CALCIUM 9.1 8.9 9.1   CBC:  Recent Labs Lab 05/29/13 0804 05/30/13 0523  WBC 7.8 5.9  HGB 14.0 13.3  HCT 40.9 39.8  MCV 111.4* 112.1*  PLT 232 219   Cardiac Enzymes:  Recent Labs Lab 05/29/13 1617 05/29/13 2207 05/30/13 0523  TROPONINI <0.30 <0.30 <0.30   BNP (last 3 results)  Recent Labs  05/29/13 0804 05/31/13 0430  PROBNP 1527.0* 1696.0*   CBG:  Recent Labs Lab 05/30/13 0601 05/30/13 1143 05/30/13 1615 05/30/13 2107 05/31/13 0540  GLUCAP 91 119* 123* 161* 112*    Studies: No results found.  Scheduled Meds: . atorvastatin  10 mg Oral q1800  . diltiazem  120 mg Oral Daily  . enoxaparin (LOVENOX) injection  100 mg Subcutaneous Q12H  . furosemide  40 mg Intravenous Q12H  . indomethacin  50 mg Oral BID WC  . insulin aspart  0-9 Units Subcutaneous TID WC  . insulin detemir  5 Units Subcutaneous QHS  . levothyroxine  75 mcg Oral QAC breakfast  . lisinopril  40 mg Oral Daily  . metoprolol  100 mg Oral BID  . pantoprazole  40 mg Oral Q1200  . potassium chloride  20 mEq Oral Daily  . predniSONE  20 mg Oral Q breakfast  . sodium chloride  3 mL Intravenous Q12H  . warfarin  7.5 mg Oral ONCE-1800  . Warfarin - Pharmacist Dosing Inpatient   Does not apply q1800    Continuous Infusions: . sodium chloride       Armen PickupManzueta, Kiernan Atkerson E Aja Bolander  [facility] Pager [phone]. If 8PM-8AM, please contact night-coverage at [email or web address], password [email or web address] 05/31/2013, 11:58 AM  LOS: 2 days

## 2013-05-31 NOTE — Discharge Instructions (Signed)
Information on my medicine - Coumadin®   (Warfarin) ° °This medication education was reviewed with me or my healthcare representative as part of my discharge preparation.  The pharmacist that spoke with me during my hospital stay was:  Sachiko Methot B, RPH ° °Why was Coumadin prescribed for you? °Coumadin was prescribed for you because you have a blood clot or a medical condition that can cause an increased risk of forming blood clots. Blood clots can cause serious health problems by blocking the flow of blood to the heart, lung, or brain. Coumadin can prevent harmful blood clots from forming. °As a reminder your indication for Coumadin is:   Stroke Prevention Because Of Atrial Fibrillation ° °What test will check on my response to Coumadin? °While on Coumadin (warfarin) you will need to have an INR test regularly to ensure that your dose is keeping you in the desired range. The INR (international normalized ratio) number is calculated from the result of the laboratory test called prothrombin time (PT). ° °If an INR APPOINTMENT HAS NOT ALREADY BEEN MADE FOR YOU please schedule an appointment to have this lab work done by your health care provider within 7 days. °Your INR goal is usually a number between:  2 to 3 or your provider may give you a more narrow range like 2-2.5.  Ask your health care provider during an office visit what your goal INR is. ° °What  do you need to  know  About  COUMADIN? °Take Coumadin (warfarin) exactly as prescribed by your healthcare provider about the same time each day.  DO NOT stop taking without talking to the doctor who prescribed the medication.  Stopping without other blood clot prevention medication to take the place of Coumadin may increase your risk of developing a new clot or stroke.  Get refills before you run out. ° °What do you do if you miss a dose? °If you miss a dose, take it as soon as you remember on the same day then continue your regularly scheduled regimen the next  day.  Do not take two doses of Coumadin at the same time. ° °Important Safety Information °A possible side effect of Coumadin (Warfarin) is an increased risk of bleeding. You should call your healthcare provider right away if you experience any of the following: °  Bleeding from an injury or your nose that does not stop. °  Unusual colored urine (red or dark brown) or unusual colored stools (red or black). °  Unusual bruising for unknown reasons. °  A serious fall or if you hit your head (even if there is no bleeding). ° °Some foods or medicines interact with Coumadin® (warfarin) and might alter your response to warfarin. To help avoid this: °  Eat a balanced diet, maintaining a consistent amount of Vitamin K. °  Notify your provider about major diet changes you plan to make. °  Avoid alcohol or limit your intake to 1 drink for women and 2 drinks for men per day. °(1 drink is 5 oz. wine, 12 oz. beer, or 1.5 oz. liquor.) ° °Make sure that ANY health care provider who prescribes medication for you knows that you are taking Coumadin (warfarin).  Also make sure the healthcare provider who is monitoring your Coumadin knows when you have started a new medication including herbals and non-prescription products. ° °Coumadin® (Warfarin)  Major Drug Interactions  °Increased Warfarin Effect Decreased Warfarin Effect  °Alcohol (large quantities) °Antibiotics (esp. Septra/Bactrim, Flagyl, Cipro) °Amiodarone (Cordarone) °Aspirin (  ASA) °Cimetidine (Tagamet) °Megestrol (Megace) °NSAIDs (ibuprofen, naproxen, etc.) °Piroxicam (Feldene) °Propafenone (Rythmol SR) °Propranolol (Inderal) °Isoniazid (INH) °Posaconazole (Noxafil) Barbiturates (Phenobarbital) °Carbamazepine (Tegretol) °Chlordiazepoxide (Librium) °Cholestyramine (Questran) °Griseofulvin °Oral Contraceptives °Rifampin °Sucralfate (Carafate) °Vitamin K  ° °Coumadin® (Warfarin) Major Herbal Interactions  °Increased Warfarin Effect Decreased Warfarin Effect   °Garlic °Ginseng °Ginkgo biloba Coenzyme Q10 °Green tea °St. John’s wort   ° °Coumadin® (Warfarin) FOOD Interactions  °Eat a consistent number of servings per week of foods HIGH in Vitamin K °(1 serving = ½ cup)  °Collards (cooked, or boiled & drained) °Kale (cooked, or boiled & drained) °Mustard greens (cooked, or boiled & drained) °Parsley *serving size only = ¼ cup °Spinach (cooked, or boiled & drained) °Swiss chard (cooked, or boiled & drained) °Turnip greens (cooked, or boiled & drained)  °Eat a consistent number of servings per week of foods MEDIUM-HIGH in Vitamin K °(1 serving = 1 cup)  °Asparagus (cooked, or boiled & drained) °Broccoli (cooked, boiled & drained, or raw & chopped) °Brussel sprouts (cooked, or boiled & drained) *serving size only = ½ cup °Lettuce, raw (green leaf, endive, romaine) °Spinach, raw °Turnip greens, raw & chopped  ° °These websites have more information on Coumadin (warfarin):  www.coumadin.com; °www.ahrq.gov/consumer/coumadin.htm; ° ° ° °

## 2013-05-31 NOTE — Plan of Care (Signed)
Problem: Phase I Progression Outcomes Goal: EF % per last Echo/documented,Core Reminder form on chart Outcome: Completed/Met Date Met:  05/31/13 50-55%

## 2013-05-31 NOTE — Progress Notes (Signed)
ANTICOAGULATION CONSULT NOTE - Follow-up Consult  Pharmacy Consult:  Coumadin + Lovenox  Indication: atrial fibrillation  Allergies  Allergen Reactions  . Shellfish Allergy Anaphylaxis  . Fish Oil Nausea And Vomiting  . Iohexol      Desc: THROAT SWELLING-REQUIRES 13 HR PREP     Patient Measurements: Height: 5\' 10"  (177.8 cm) Weight: 242 lb 1 oz (109.8 kg) IBW/kg (Calculated) : 73  Vital Signs: Temp: 97.7 F (36.5 C) (03/15 0527) Temp src: Oral (03/15 0527) BP: 139/78 mmHg (03/15 0527) Pulse Rate: 89 (03/15 0527)  Labs:  Recent Labs  05/29/13 0804 05/29/13 0828 05/29/13 1617 05/29/13 2207 05/30/13 0523 05/31/13 0430  HGB 14.0  --   --   --  13.3  --   HCT 40.9  --   --   --  39.8  --   PLT 232  --   --   --  219  --   LABPROT  --  21.9*  --   --  19.1* 21.9*  INR  --  1.98*  --   --  1.66* 1.98*  CREATININE 0.89  --   --   --  0.95 1.21  TROPONINI  --   --  <0.30 <0.30 <0.30  --     Estimated Creatinine Clearance: 76.5 ml/min (by C-G formula based on Cr of 1.21).   Medical History: Past Medical History  Diagnosis Date  . Coronary atherosclerosis of native coronary artery     Multivessel, LVEF 65%  . Superficial thrombophlebitis   . Varicose veins   . Diabetes mellitus, type 2   . Hyperlipidemia   . Chronic back pain   . Essential hypertension, benign   . Atrial fibrillation 2011    Postoperative lap chole  . Atrial flutter 2011    Postoperative lap chole    Assessment: [name] [name] presents 3/13 with acute on chronic diastolic heart failure to continue on Coumadin from PTA for Afib/aflutter. INR slightly sub-therapeutic 1.98 on home dose, now back to 1.98 from initial drop. No bleeding reported. Pt remains in Afib; per Dr. Ozella AlmondMadera's request, ordered enoxaparin for interim bridging until therapeutic given possible TEE/cardioversion   Coumadin PTA dose 2.5mg  daily except 5mg  TTS  Goal of Therapy:  INR 2-3 Monitor platelets by anticoagulation protocol:  Yes   Plan:  - Coumadin 7.5mg  PO x 1 tonight  - Lovenox 100mg  sq Q12h - Daily PT / INR - D/C Lovenox when INR > 2 - F/u cards plans on TEE/cardioversion ?Monday  [name], PharmD Clinical Pharmacist - Resident Phone: [phone] Pager: [phone] 05/31/2013 8:21 AM

## 2013-05-31 NOTE — Progress Notes (Addendum)
DAILY PROGRESS NOTE  Subjective:  Net out almost 3L.  Breathing has improved. Remains in atrial flutter with controlled ventricular response in the 70's.  INR has not been consistently therapeutic.  Objective:  Temp:  [97.7 F (36.5 C)-98.3 F (36.8 C)] 97.7 F (36.5 C) (03/15 0527) Pulse Rate:  [89-111] 89 (03/15 0527) Resp:  [18] 18 (03/15 0527) BP: (128-139)/(69-78) 139/78 mmHg (03/15 0527) SpO2:  [96 %-99 %] 99 % (03/15 0527) Weight:  [242 lb 1 oz (109.8 kg)] 242 lb 1 oz (109.8 kg) (03/15 0527) Weight change: -7 lb 15 oz (-3.599 kg)  Intake/Output from previous day: 03/14 0701 - 03/15 0700 In: 920 [P.O.:920] Out: 1750 [Urine:1750]  Intake/Output from this shift: Total I/O In: 240 [P.O.:240] Out: -   Medications: Current Facility-Administered Medications  Medication Dose Route Frequency Provider Last Rate Last Dose  . 0.9 %  sodium chloride infusion  250 mL Intravenous PRN Zerita Boers, MD      . atorvastatin (LIPITOR) tablet 10 mg  10 mg Oral q1800 Zerita Boers, MD   10 mg at 05/30/13 2128  . diltiazem (CARDIZEM LA) 24 hr tablet 120 mg  120 mg Oral Daily Pixie Casino, MD   120 mg at 05/30/13 1555  . enoxaparin (LOVENOX) injection 100 mg  100 mg Subcutaneous Q12H Zerita Boers, MD   100 mg at 05/30/13 2109  . furosemide (LASIX) injection 40 mg  40 mg Intravenous Q12H Zerita Boers, MD   40 mg at 05/31/13 0522  . insulin aspart (novoLOG) injection 0-9 Units  0-9 Units Subcutaneous TID WC Zerita Boers, MD   1 Units at 05/30/13 1737  . insulin detemir (LEVEMIR) injection 5 Units  5 Units Subcutaneous QHS Zerita Boers, MD   5 Units at 05/30/13 2128  . levothyroxine (SYNTHROID, LEVOTHROID) tablet 75 mcg  75 mcg Oral QAC breakfast Zerita Boers, MD   75 mcg at 05/31/13 [phone]  . lisinopril (PRINIVIL,ZESTRIL) tablet 40 mg  40 mg Oral Daily Zerita Boers, MD   40 mg at 05/30/13 0919    . metoprolol (LOPRESSOR) tablet 100 mg  100 mg Oral BID Zerita Boers, MD   100 mg at 05/30/13 2109  . pantoprazole (PROTONIX) EC tablet 40 mg  40 mg Oral Q1200 Zerita Boers, MD   40 mg at 05/30/13 1317  . potassium chloride SA (K-DUR,KLOR-CON) CR tablet 20 mEq  20 mEq Oral Daily Zerita Boers, MD   20 mEq at 05/30/13 0920  . predniSONE (DELTASONE) tablet 20 mg  20 mg Oral Q breakfast Zerita Boers, MD   20 mg at 05/31/13 0636  . sodium chloride 0.9 % injection 3 mL  3 mL Intravenous Q12H Zerita Boers, MD   3 mL at 05/30/13 2109  . sodium chloride 0.9 % injection 3 mL  3 mL Intravenous PRN Zerita Boers, MD   3 mL at 05/30/13 0920  . warfarin (COUMADIN) tablet 7.5 mg  7.5 mg Oral ONCE-1800 Burna Cash, RPH      . Warfarin - Pharmacist Dosing Inpatient   Does not apply q1800 Saundra Shelling, [facility]        Physical Exam: General appearance: alert and no distress Neck: no carotid bruit, no JVD and thick Lungs: diminished breath sounds bilaterally Heart: irregularly irregular rhythm Extremities: edema 2+ bilateral LE and venous  stasis dermatitis noted Skin: erythema and stasis dermatitis of the LE  Lab Results: Results for orders placed during the hospital encounter of 05/29/13 (from the past 48 hour(s))  GLUCOSE, CAPILLARY     Status: None   Collection Time    05/29/13 12:45 PM      Result Value Ref Range   Glucose-Capillary 90  70 - 99 mg/dL  TSH     Status: Abnormal   Collection Time    05/29/13  4:17 PM      Result Value Ref Range   TSH 6.071 (*) 0.350 - 4.500 uIU/mL   Comment: Performed at [facility]  TROPONIN I     Status: None   Collection Time    05/29/13  4:17 PM      Result Value Ref Range   Troponin I <0.30  <0.30 ng/mL   Comment:            Due to the release kinetics of cTnI,     a negative result within the first hours     of the onset of symptoms does not rule out     myocardial infarction  with certainty.     If myocardial infarction is still suspected,     repeat the test at appropriate intervals.  HEMOGLOBIN A1C     Status: Abnormal   Collection Time    05/29/13  4:17 PM      Result Value Ref Range   Hemoglobin A1C 6.0 (*) <5.7 %   Comment: (NOTE)                                                                               According to the ADA Clinical Practice Recommendations for 2011, when     HbA1c is used as a screening test:      >=6.5%   Diagnostic of Diabetes Mellitus               (if abnormal result is confirmed)     5.7-6.4%   Increased risk of developing Diabetes Mellitus     References:Diagnosis and Classification of Diabetes Mellitus,Diabetes     ULAG,5364,68(EHOZY 1):S62-S69 and Standards of Medical Care in             Diabetes - 2011,Diabetes Care,2011,34 (Suppl 1):S11-S61.   Mean Plasma Glucose 126 (*) <117 mg/dL   Comment: Performed at [place], CAPILLARY     Status: Abnormal   Collection Time    05/29/13  4:30 PM      Result Value Ref Range   Glucose-Capillary 104 (*) 70 - 99 mg/dL  GLUCOSE, CAPILLARY     Status: None   Collection Time    05/29/13  9:24 PM      Result Value Ref Range   Glucose-Capillary 93  70 - 99 mg/dL  TROPONIN I     Status: None   Collection Time    05/29/13 10:07 PM      Result Value Ref Range   Troponin I <0.30  <0.30 ng/mL   Comment:            Due to the release kinetics of cTnI,  a negative result within the first hours     of the onset of symptoms does not rule out     myocardial infarction with certainty.     If myocardial infarction is still suspected,     repeat the test at appropriate intervals.  TROPONIN I     Status: None   Collection Time    05/30/13  5:23 AM      Result Value Ref Range   Troponin I <0.30  <0.30 ng/mL   Comment:            Due to the release kinetics of cTnI,     a negative result within the first hours     of the onset of symptoms does not rule out      myocardial infarction with certainty.     If myocardial infarction is still suspected,     repeat the test at appropriate intervals.  BASIC METABOLIC PANEL     Status: Abnormal   Collection Time    05/30/13  5:23 AM      Result Value Ref Range   Sodium 142  137 - 147 mEq/L   Potassium 3.7  3.7 - 5.3 mEq/L   Chloride 98  96 - 112 mEq/L   CO2 26  19 - 32 mEq/L   Glucose, Bld 95  70 - 99 mg/dL   BUN 12  6 - 23 mg/dL   Creatinine, Ser 0.95  0.50 - 1.35 mg/dL   Calcium 8.9  8.4 - 10.5 mg/dL   GFR calc non Af Amer 86 (*) >90 mL/min   GFR calc Af Amer >90  >90 mL/min   Comment: (NOTE)     The eGFR has been calculated using the CKD EPI equation.     This calculation has not been validated in all clinical situations.     eGFR's persistently <90 mL/min signify possible Chronic Kidney     Disease.  CBC     Status: Abnormal   Collection Time    05/30/13  5:23 AM      Result Value Ref Range   WBC 5.9  4.0 - 10.5 K/uL   RBC 3.55 (*) 4.22 - 5.81 MIL/uL   Hemoglobin 13.3  13.0 - 17.0 g/dL   HCT 39.8  39.0 - 52.0 %   MCV 112.1 (*) 78.0 - 100.0 fL   MCH 37.5 (*) 26.0 - 34.0 pg   MCHC 33.4  30.0 - 36.0 g/dL   RDW 16.1 (*) 11.5 - 15.5 %   Platelets 219  150 - 400 K/uL  PROTIME-INR     Status: Abnormal   Collection Time    05/30/13  5:23 AM      Result Value Ref Range   Prothrombin Time 19.1 (*) 11.6 - 15.2 seconds   INR 1.66 (*) 0.00 - 1.49  LIPID PANEL     Status: Abnormal   Collection Time    05/30/13  5:23 AM      Result Value Ref Range   Cholesterol 214 (*) 0 - 200 mg/dL   Triglycerides 90  <150 mg/dL   HDL 85  >39 mg/dL   Total CHOL/HDL Ratio 2.5     VLDL 18  0 - 40 mg/dL   LDL Cholesterol 111 (*) 0 - 99 mg/dL   Comment:            Total Cholesterol/HDL:CHD Risk     Coronary Heart Disease Risk Table  Men   Women      1/2 Average Risk   3.4   3.3      Average Risk       5.0   4.4      2 X Average Risk   9.6   7.1      3 X Average Risk  23.4   11.0                 Use the calculated Patient Ratio     above and the CHD Risk Table     to determine the patient's CHD Risk.                ATP III CLASSIFICATION (LDL):      <100     mg/dL   Optimal      100-129  mg/dL   Near or Above                        Optimal      130-159  mg/dL   Borderline      160-189  mg/dL   High      >190     mg/dL   Very High  GLUCOSE, CAPILLARY     Status: None   Collection Time    05/30/13  6:01 AM      Result Value Ref Range   Glucose-Capillary 91  70 - 99 mg/dL  GLUCOSE, CAPILLARY     Status: Abnormal   Collection Time    05/30/13 11:43 AM      Result Value Ref Range   Glucose-Capillary 119 (*) 70 - 99 mg/dL  GLUCOSE, CAPILLARY     Status: Abnormal   Collection Time    05/30/13  4:15 PM      Result Value Ref Range   Glucose-Capillary 123 (*) 70 - 99 mg/dL  GLUCOSE, CAPILLARY     Status: Abnormal   Collection Time    05/30/13  9:07 PM      Result Value Ref Range   Glucose-Capillary 161 (*) 70 - 99 mg/dL  BASIC METABOLIC PANEL     Status: Abnormal   Collection Time    05/31/13  4:30 AM      Result Value Ref Range   Sodium 141  137 - 147 mEq/L   Potassium 3.7  3.7 - 5.3 mEq/L   Chloride 95 (*) 96 - 112 mEq/L   CO2 27  19 - 32 mEq/L   Glucose, Bld 101 (*) 70 - 99 mg/dL   BUN 19  6 - 23 mg/dL   Creatinine, Ser 1.21  0.50 - 1.35 mg/dL   Calcium 9.1  8.4 - 10.5 mg/dL   GFR calc non Af Amer 62 (*) >90 mL/min   GFR calc Af Amer 71 (*) >90 mL/min   Comment: (NOTE)     The eGFR has been calculated using the CKD EPI equation.     This calculation has not been validated in all clinical situations.     eGFR's persistently <90 mL/min signify possible Chronic Kidney     Disease.  PROTIME-INR     Status: Abnormal   Collection Time    05/31/13  4:30 AM      Result Value Ref Range   Prothrombin Time 21.9 (*) 11.6 - 15.2 seconds   INR 1.98 (*) 0.00 - 1.49  PRO B NATRIURETIC PEPTIDE     Status: Abnormal   Collection Time  05/31/13  4:30 AM       Result Value Ref Range   Pro B Natriuretic peptide (BNP) 1696.0 (*) 0 - 125 pg/mL  GLUCOSE, CAPILLARY     Status: Abnormal   Collection Time    05/31/13  5:40 AM      Result Value Ref Range   Glucose-Capillary 112 (*) 70 - 99 mg/dL    Imaging: No results found.  Assessment:  Principal Problem:   Acute on chronic diastolic heart failure Active Problems:   Mixed hyperlipidemia   Essential hypertension, benign   Atrial flutter   Tachycardia   Lower extremity edema 1. Venous stasis dermatitis  Plan:  1. Keep NPO p MN For TEE/Cardioversion attempt tomorrow. I will be performing. INR today is 1.98. He is on therapeutic lovenox as well.  Time Spent Directly with Patient:  15 minutes  Length of Stay:  LOS: 2 days   Pixie Casino, MD, [facility] Attending Cardiologist [facility]  Renuka Farfan C 05/31/2013, 10:06 AM

## 2013-06-01 ENCOUNTER — Encounter (HOSPITAL_COMMUNITY)
Admission: EM | Disposition: A | Discharge status: Home / Self Care | Payer: Self-pay | Source: Home / Self Care | Attending: Internal Medicine

## 2013-06-01 ENCOUNTER — Encounter (HOSPITAL_COMMUNITY): Payer: BC Managed Care – PPO | Admitting: Anesthesiology

## 2013-06-01 ENCOUNTER — Encounter (HOSPITAL_COMMUNITY): Payer: Self-pay

## 2013-06-01 ENCOUNTER — Inpatient Hospital Stay (HOSPITAL_COMMUNITY): Payer: BC Managed Care – PPO | Admitting: Anesthesiology

## 2013-06-01 ENCOUNTER — Other Ambulatory Visit: Payer: Self-pay | Admitting: Cardiology

## 2013-06-01 HISTORY — PX: CARDIOVERSION: SHX1299

## 2013-06-01 HISTORY — PX: TEE WITHOUT CARDIOVERSION: SHX5443

## 2013-06-01 LAB — BASIC METABOLIC PANEL
BUN: 29 mg/dL — AB (ref 6–23)
CALCIUM: 8.8 mg/dL (ref 8.4–10.5)
CO2: 19 mEq/L (ref 19–32)
Chloride: 99 mEq/L (ref 96–112)
Creatinine, Ser: 1.28 mg/dL (ref 0.50–1.35)
GFR calc Af Amer: 67 mL/min — ABNORMAL LOW (ref >90)
GFR, EST NON AFRICAN AMERICAN: 58 mL/min — AB (ref >90)
GLUCOSE: 89 mg/dL (ref 70–99)
Potassium: 4.5 mEq/L (ref 3.7–5.3)
Sodium: 139 mEq/L (ref 137–147)

## 2013-06-01 LAB — GLUCOSE, CAPILLARY
GLUCOSE-CAPILLARY: 177 mg/dL — AB (ref 70–99)
Glucose-Capillary: 101 mg/dL — ABNORMAL HIGH (ref 70–99)
Glucose-Capillary: 147 mg/dL — ABNORMAL HIGH (ref 70–99)

## 2013-06-01 LAB — PROTIME-INR
INR: 1.94 — ABNORMAL HIGH (ref 0.00–1.49)
Prothrombin Time: 21.6 seconds — ABNORMAL HIGH (ref 11.6–15.2)

## 2013-06-01 SURGERY — ECHOCARDIOGRAM, TRANSESOPHAGEAL
Anesthesia: General

## 2013-06-01 MED ORDER — BUTAMBEN-TETRACAINE-BENZOCAINE 2-2-14 % EX AERO
INHALATION_SPRAY | CUTANEOUS | Status: DC | PRN
  Administered 2013-06-01: 2 via TOPICAL

## 2013-06-01 MED ORDER — GLIMEPIRIDE 4 MG PO TABS: 4.0000 mg | ORAL_TABLET | Freq: Every day | ORAL | Status: DC

## 2013-06-01 MED ORDER — DILTIAZEM HCL ER COATED BEADS 120 MG PO TB24: 120.0000 mg | ORAL_TABLET | Freq: Every day | ORAL | Status: DC

## 2013-06-01 MED ORDER — FUROSEMIDE 40 MG PO TABS: 40.0000 mg | ORAL_TABLET | Freq: Two times a day (BID) | ORAL | Status: DC

## 2013-06-01 MED ORDER — PROPOFOL 10 MG/ML IV BOLUS
INTRAVENOUS | Status: DC | PRN
  Administered 2013-06-01: 80 mg via INTRAVENOUS
  Administered 2013-06-01: 50 mg via INTRAVENOUS

## 2013-06-01 MED ORDER — MIDAZOLAM HCL 10 MG/2ML IJ SOLN
INTRAMUSCULAR | Status: DC | PRN
  Administered 2013-06-01: 2 mg via INTRAVENOUS
  Administered 2013-06-01 (×3): 1 mg via INTRAVENOUS

## 2013-06-01 MED ORDER — NITROGLYCERIN 0.4 MG SL SUBL: 0.4000 mg | SUBLINGUAL_TABLET | SUBLINGUAL | Status: AC | PRN

## 2013-06-01 MED ORDER — POTASSIUM CHLORIDE CRYS ER 20 MEQ PO TBCR: 20.0000 meq | EXTENDED_RELEASE_TABLET | Freq: Every day | ORAL | Status: DC

## 2013-06-01 MED ORDER — ATORVASTATIN CALCIUM 20 MG PO TABS: 20.0000 mg | ORAL_TABLET | Freq: Every day | ORAL | Status: DC

## 2013-06-01 MED ORDER — PREDNISONE 20 MG PO TABS: 20.0000 mg | ORAL_TABLET | Freq: Two times a day (BID) | ORAL | Status: DC

## 2013-06-01 MED ORDER — SODIUM CHLORIDE 0.9 % IV SOLN
INTRAVENOUS | Status: DC | PRN
  Administered 2013-06-01: 09:00:00 via INTRAVENOUS

## 2013-06-01 MED ORDER — PANTOPRAZOLE SODIUM 40 MG PO TBEC: 40.0000 mg | DELAYED_RELEASE_TABLET | Freq: Every day | ORAL | Status: DC

## 2013-06-01 MED ORDER — LEVOTHYROXINE SODIUM 75 MCG PO TABS: 75.0000 ug | ORAL_TABLET | Freq: Every day | ORAL | Status: AC

## 2013-06-01 MED ORDER — WARFARIN SODIUM 5 MG PO TABS
5.0000 mg | ORAL_TABLET | Freq: Once | ORAL | Status: DC
  Filled 2013-06-01: qty 1

## 2013-06-01 MED ORDER — FENTANYL CITRATE 0.05 MG/ML IJ SOLN
INTRAMUSCULAR | Status: DC | PRN
  Administered 2013-06-01 (×3): 12.5 ug via INTRAVENOUS
  Administered 2013-06-01: 25 ug via INTRAVENOUS

## 2013-06-01 MED ORDER — ENOXAPARIN SODIUM 100 MG/ML ~~LOC~~ SOLN: 100.0000 mg | Freq: Two times a day (BID) | SUBCUTANEOUS | Status: DC

## 2013-06-01 NOTE — Progress Notes (Signed)
Patient verbalized understanding of discharge instructions, stated going to buy some [employer] for seasoning food.  Provided return demonstration for Lovenox injection.  Patient stated will pick up medications on the way home.  Vital signs stable.

## 2013-06-01 NOTE — Progress Notes (Signed)
UR completed Phebe Dettmer K. Makinley Muscato, RN, BSN, MSHL, CCM  06/01/2013 12:27 PM

## 2013-06-01 NOTE — Anesthesia Postprocedure Evaluation (Signed)
  Anesthesia Post-op Note  Patient: Volney PresserLarry W Dockery  Procedure(s) Performed: Procedure(s) with comments: TRANSESOPHAGEAL ECHOCARDIOGRAM (TEE) (N/A) CARDIOVERSION (N/A) - 9:59 anesthesiologist present, giving Lido 40 mg,IV followed by Propofol 110 mg,IV  10:02 synced cardioversion @ 150 joules, SR with PAC's    Patient Location: PACU and Endoscopy Unit  Anesthesia Type:General  Level of Consciousness: awake, alert  and oriented  Airway and Oxygen Therapy: Patient Spontanous Breathing and Patient connected to nasal cannula oxygen  Post-op Pain: none  Post-op Assessment: Post-op Vital signs reviewed and Patient's Cardiovascular Status Stable  Post-op Vital Signs: Reviewed and stable  Complications: No apparent anesthesia complications

## 2013-06-01 NOTE — CV Procedure (Signed)
TEE/CARDIOVERSION NOTE  TRANSESOPHAGEAL ECHOCARDIOGRAM (TEE):  Indictation: Atrial Flutter  Consent:   Informed consent was obtained prior to the procedure. The risks, benefits and alternatives for the procedure were discussed and the patient comprehended these risks.  Risks include, but are not limited to, cough, sore throat, vomiting, nausea, somnolence, esophageal and stomach trauma or perforation, bleeding, low blood pressure, aspiration, pneumonia, infection, trauma to the teeth and death.    Time Out: Verified patient identification, verified procedure, site/side was marked, verified correct patient position, special equipment/implants available, medications/allergies/relevent history reviewed, required imaging and test results available. Performed  Procedure:  After a procedural time-out, the patient was given 5 mg versed and 72.5 mcg fentanyl for moderate sedation.  The oropharynx was anesthetized with 2 cetacaine sprays.  The transesophageal probe was inserted in the esophagus and stomach without difficulty and multiple views were obtained.  The patient was kept under observation until the patient left the procedure room.  The patient left the procedure room in stable condition.   Agitated microbubble saline contrast was not administered.   Definity echo-enhancing agent was administered.  Complications:    Complications: None Patient did tolerate procedure well.  Findings:  1. LEFT VENTRICLE: The left ventricular wall thickness is mildly hypertrophied.  The left ventricular cavity is normal in size. Wall motion is normal.  LVEF is 55%.  2. RIGHT VENTRICLE:  The right ventricle is normal in structure and function without any thrombus or masses.    3. LEFT ATRIUM:  The left atrium is dilated in size without any thrombus or masses.  There is not spontaneous echo contrast ("smoke") in the left atrium consistent with a low flow state.  4. LEFT ATRIAL APPENDAGE:  The left  atrial appendage was well-visualized with Definity echo-enhancing agent in multiple planes and is free of any thrombus or masses. The appendage has single lobes. Pulse doppler indicates moderate flow in the appendage.  There is a prominent "coumadin ridge" shadow.  5. ATRIAL SEPTUM:  The atrial septum appears intact and is free of thrombus and/or masses.  There is no evidence for interatrial shunting by Definity echo-enhancing agent.  6. RIGHT ATRIUM:  The right atrium is normal in size and function without any thrombus or masses.  7. MITRAL VALVE:  The mitral valve has a slightly redundant anterior leaflet tip with  trivial regurgitation.  There were no vegetations or stenosis.  8. AORTIC VALVE:  The aortic valve is normal in structure and function with no regurgitation.  There were no vegetations or stenosis  9. TRICUSPID VALVE:  The tricuspid valve is normal in structure and function with Mild regurgitation.  There were no vegetations or stenosis  10.  PULMONIC VALVE:  The pulmonic valve is normal in structure and function with no regurgitation.  There were no vegetations or stenosis.   11. AORTIC ARCH, ASCENDING AND DESCENDING AORTA:  The aorta was poorly visualized.  CARDIOVERSION:     Second Time Out: Verified patient identification, verified procedure, site/side was marked, verified correct patient position, special equipment/implants available, medications/allergies/relevent history reviewed, required imaging and test results available.  Performed  Procedure:  1. Patient placed on cardiac monitor, pulse oximetry, supplemental oxygen as necessary.  2. Sedation administered per anesthesia 3. Pacer pads placed anterior and posterior chest. 4. Cardioverted 1 time(s).  5. Cardioverted at 150J biphasic.  Complications:  Complications: None Patient did tolerate procedure well.  Impression:  1. No LAA thrombus - well-visualized with Definity echo-enhancing agent. 2. LVEF  55%, no  WMA's. 3. Dilated LA 4. Successful DCCV to NSR with PAC's with a single 150J biphasic shock.  Recommendations:  1. Continue therapeutic lovenox and warfarin until INR is consistently therapeutic between 2-3. 2. Will need close INR monitoring at follow-up. Check 12 Lead EKG. 3. Probably could be discharged later today when more awake and alert. 4. Follow-up with Dr. Diona Browner after discharge.  Time Spent Directly with the Patient:  60 minutes   Chrystie Nose, MD, [facility] Attending Cardiologist [facility]  06/01/2013, 10:22 AM

## 2013-06-01 NOTE — Preoperative (Signed)
Beta Blockers   Reason not to administer Beta Blockers:Not Applicable 

## 2013-06-01 NOTE — Discharge Summary (Signed)
Physician Discharge Summary  [name] [name]:[id number] DOB: 1950/06/12 DOA: 05/29/2013  PCP: Talmage Coin, MD  Admit date: 05/29/2013 Discharge date: 06/01/2013  Time spent: >30 minutes  Recommendations for Outpatient Follow-up:  1. BMET to follow electrolytes and renal function 2. Reassess gout flare and if subsided, start allopurinol or uloric 3. Recheck thyroid function test in 2-4 weeks and adjust medications as needed 4. LFT's in 4-6 weeks after starting statin 5. CBC to follow Hgb as patient discharge on coumadin and lovenox bridging therapy.  Discharge Diagnoses:  Principal Problem:   Acute on chronic diastolic heart failure Active Problems:   Mixed hyperlipidemia   Essential hypertension, benign   Atrial flutter   Tachycardia   Lower extremity edema GERD Gout flare  Abnormal TSH  Discharge Condition: stable and improved. Discharged home with instructions to follow with PCP, cardiology and coumadin clinic.  Diet recommendation: low sodium and low carb diet  Filed Weights   05/30/13 0456 05/31/13 0527 06/01/13 0529  Weight: 111.585 kg (246 lb) 109.8 kg (242 lb 1 oz) 110.1 kg (242 lb 11.6 oz)    History of present illness:  63 y.o. male with pmh significant for DM type 2, atrial flutter/atrial fibrillation (on coumadin), varicose veins/venous insufficiency, HLD and HTN; came to ED complaining of worsening LE edema and mild SOB with exertion. Patient reports symptoms has been present for the last week or so and worsening. Patient denies CP, fever, cough, chills, nausea, vomiting, dysuria, hematemesis, melena or hematochezia. In ED was found to have HR in the 130-140's, #++ LE edema, elevated BNP and CXR with enlarged cardiac silhouette and vascular congestion. TRH called to admit patient for CHF exacerbation.   Hospital Course:  1-Acute on chronic diastolic heart failure: patient denies CP or palpitations; has remained on SR after cardioversion; HR now in the 70's   -daily weight  -continue lasix 40mg  BID  -continue metoprolol   -TSH elevated (6.071), patient started on synthroid -2-D echo demonstrated diastolic heart fialure; preserved EF, no wall motion abnormalities  -CE'z neg  -further rec's per cardiology as an outpatient.  2-Mixed hyperlipidemia: LDL 111  -started on lipitor 20mg    3-Essential hypertension, benign: much better  -continue lasix now 40mg  BID -continue cardizem 120mg  daily  -metoprolol 50 mg BID  -advise to follow low sodium diet   4-Atrial flutter/atrial fibrillation:  -patient will be discharge on metoprolol -cardizem 120mg  daily -status post TEE cardioversion with SR state at discharge -follow up with cardiology in 2 weeks -will continue coumadin and since INR no therapeutic will discharge on lovenox bridging therapy   5-Lower extremity edema: due to CHF on top of underlying venous insufficiency/stasis disease.  -continue lasix now 40mg  BID; cardiology to adjust base un response to heart failure after cardioversion and rhythm control  -swelling significantly improved at discharge -actos discontinue -patient also advised to follow low sodium diet   6-DM type 2:  -A1C 6.0  -will discharge on metformin and amaryl -actos discontinue given CHF and ongoing swelling -patient advise to follow low carb diet   7-GERD: continue PPI   8-Gout: with acute flare from lasix most likely  -will continue prednisone  -will benefit of allopurinol or uloric therapy as an outpatient after flare subsided   9-abnormal TSH:  -with normal T4  -given ongoing abnormal rhythm and risk for cardiovascular disease will start synthroid daily and follow thyroid function in 2-4 weeks.    Procedures:  TEE cardioversion on 3/16 (sinus rhythm at discharge)  2-ECHO: 3/14 - Left ventricle: The cavity size was normal. Wall thickness was normal. Systolic function was normal. The estimated ejection fraction was in the range of 50% to  55%. Incoordinate septal motion. The study is not technically sufficient to allow evaluation of LV diastolic function. - Mitral valve: Mild regurgitation. - Left atrium: The atrium was mildly dilated. (21 cm2). - Right atrium: The atrium was mildly dilated (20 cm2). - Tricuspid valve: Moderate regurgitation. - Pulmonary arteries: PA peak pressure: 37mm Hg (S). - Inferior vena cava: The vessel was dilated; the respirophasic diameter changes were blunted (< 50%); findings are consistent with elevated central venous pressure. - Pericardium, extracardiac: There was no pericardial effusion.   Consultations:  Cardiology   Discharge Exam: Filed Vitals:   06/01/13 1323  BP: 114/59  Pulse: 86  Temp: 97.7 F (36.5 C)  Resp: 16   General: Alert, awake, oriented x3, in no acute distress.  HEENT: No bruits, no goiter. No JVD  Heart: regular, no rubs or gallops. 1++ bilaterally LE edema  Lungs: Good air movement, no crackles  Abdomen: Soft, nontender, nondistended, positive bowel sounds.  MSK: improving swelling on his right hand due to gout; no warmness and less tenderness  Neuro: Grossly intact, nonfocal.    Discharge Instructions  Discharge Orders   Future Orders Complete By Expires   Diet - low sodium heart healthy  As directed    Discharge instructions  As directed    Comments:     Take medication as prescribed Follow a low sodium diet (less than 2 gram daily) Please arrange follow up with your coumadin clinic in 2 days Arrange follow up with Dr. Diona Browner (cardiology) in 2 weeks Arrange follow up with PCP in 1 week       Medication List    STOP taking these medications       pioglitazone 15 MG tablet  Commonly known as:  ACTOS      TAKE these medications       atorvastatin 20 MG tablet  Commonly known as:  LIPITOR  Take 1 tablet (20 mg total) by mouth daily at 6 PM.     diltiazem 120 MG 24 hr tablet  Commonly known as:  CARDIZEM LA  Take 1 tablet (120 mg  total) by mouth daily.     enoxaparin 100 MG/ML injection  Commonly known as:  LOVENOX  Inject 1 mL (100 mg total) into the skin every 12 (twelve) hours.     furosemide 40 MG tablet  Commonly known as:  LASIX  Take 1 tablet (40 mg total) by mouth 2 (two) times daily. For fluid retention.     levothyroxine 75 MCG tablet  Commonly known as:  SYNTHROID, LEVOTHROID  Take 1 tablet (75 mcg total) by mouth daily before breakfast.     lisinopril 40 MG tablet  Commonly known as:  PRINIVIL,ZESTRIL  TAKE 1 TABLET EVERY DAY     metFORMIN 500 MG tablet  Commonly known as:  GLUCOPHAGE  Take 500 mg by mouth 2 (two) times daily.     metoprolol 50 MG tablet  Commonly known as:  LOPRESSOR  TAKE 1 TABLET BY MOUTH TWICE A DAY     nitroGLYCERIN 0.4 MG SL tablet  Commonly known as:  NITROSTAT  Place 1 tablet (0.4 mg total) under the tongue every 5 (five) minutes as needed. For chest pains.     pantoprazole 40 MG tablet  Commonly known as:  PROTONIX  Take 1 tablet (40  mg total) by mouth daily at 12 noon.     potassium chloride SA 20 MEQ tablet  Commonly known as:  K-DUR,KLOR-CON  Take 1 tablet (20 mEq total) by mouth daily.     predniSONE 20 MG tablet  Commonly known as:  DELTASONE  Take 1 tablet (20 mg total) by mouth 2 (two) times daily with a meal.     warfarin 5 MG tablet  Commonly known as:  COUMADIN  Take 2.5-5 mg by mouth See admin instructions. 2.5 mg ( 0.5 tab )  Monday Wednesday Friday Sunday  and 5 mg on Tuesday Thursday Saturday.       Allergies  Allergen Reactions  . Shellfish Allergy Anaphylaxis  . Fish Oil Nausea And Vomiting  . Iohexol      Desc: THROAT SWELLING-REQUIRES 13 HR PREP        Follow-up Information   Follow up with KERR,JEFFREY, MD. Schedule an appointment as soon as possible for a visit in 1 week.   Specialty:  Endocrinology   Contact information:   [address] [place] [address] [phone]       Follow up with Nona DellSamuel  McDowell, MD. Schedule an appointment as soon as possible for a visit in 10 days.   Specialty:  Cardiology   Contact information:   [address] [place] [address] [phone]       The results of significant diagnostics from this hospitalization (including imaging, microbiology, ancillary and laboratory) are listed below for reference.    Significant Diagnostic Studies: Dg Chest 2 View  05/29/2013   CLINICAL DATA:  Tachycardia, history of hypertension  EXAM: CHEST  2 VIEW  COMPARISON:  DG CHEST 2 VIEW dated 11/25/2012  FINDINGS: The lungs are adequately inflated. There is no focal infiltrate. The interstitial markings are mildly prominent bilaterally but stable. The cardiopericardial silhouette is normal in size. The pulmonary vascularity is not engorged. The patient has undergone previous CABG. There is no pleural effusion or pneumothorax. The trachea is midline. The observed portions of the bony thorax exhibit no acute abnormalities.  IMPRESSION: 1. There is mild stable prominence of the pulmonary interstitial markings in the setting of enlargement of the cardiac silhouette. The possibility of low-grade compensated CHF is raised. 2. There is no evidence of pneumonia nor pleural effusion nor pneumothorax.   Electronically Signed   By: David  SwazilandJordan   On: 05/29/2013 09:22   Labs: Basic Metabolic Panel:  Recent Labs Lab 05/29/13 0804 05/30/13 0523 05/31/13 0430 06/01/13 0420  NA 142 142 141 139  K 4.3 3.7 3.7 4.5  CL 99 98 95* 99  CO2 25 26 27 19   GLUCOSE 98 95 101* 89  BUN 10 12 19  29*  CREATININE 0.89 0.95 1.21 1.28  CALCIUM 9.1 8.9 9.1 8.8   CBC:  Recent Labs Lab 05/29/13 0804 05/30/13 0523  WBC 7.8 5.9  HGB 14.0 13.3  HCT 40.9 39.8  MCV 111.4* 112.1*  PLT 232 219   Cardiac Enzymes:  Recent Labs Lab 05/29/13 1617 05/29/13 2207 05/30/13 0523  TROPONINI <0.30 <0.30 <0.30   BNP: BNP (last 3 results)  Recent Labs  05/29/13 0804 05/31/13 0430  PROBNP  1527.0* 1696.0*   CBG:  Recent Labs Lab 05/31/13 1618 05/31/13 2025 06/01/13 0646 06/01/13 1209 06/01/13 1603  GLUCAP 192* 133* 101* 147* 177*     Signed:  Vassie LollMadera, Lemont Sitzmann  [facility] 06/01/2013, 4:32 PM

## 2013-06-01 NOTE — Anesthesia Procedure Notes (Signed)
Date/Time: 06/01/2013 9:58 AM Performed by: Gwenyth AllegraADAMI, Adeliz Tonkinson Pre-anesthesia Checklist: Patient identified, Emergency Drugs available, Patient being monitored and Timeout performed Patient Re-evaluated:Patient Re-evaluated prior to inductionOxygen Delivery Method: Ambu bag Preoxygenation: Pre-oxygenation with 100% oxygen Intubation Type: IV induction Grade View: Grade III

## 2013-06-01 NOTE — Progress Notes (Signed)
ANTICOAGULATION CONSULT NOTE - Follow-up Consult  Pharmacy Consult:  Coumadin + Lovenox  Indication: atrial fibrillation  Allergies  Allergen Reactions  . Shellfish Allergy Anaphylaxis  . Fish Oil Nausea And Vomiting  . Iohexol      Desc: THROAT SWELLING-REQUIRES 13 HR PREP     Patient Measurements: Height: 5\' 10"  (177.8 cm) Weight: 242 lb 11.6 oz (110.1 kg) IBW/kg (Calculated) : 73  Vital Signs: Temp: 97.5 F (36.4 C) (03/16 0529) Temp src: Oral (03/16 0529) BP: 133/66 mmHg (03/16 0529) Pulse Rate: 65 (03/16 0529)  Labs:  Recent Labs  05/29/13 1617 05/29/13 2207 05/30/13 0523 05/31/13 0430 06/01/13 0420  HGB  --   --  13.3  --   --   HCT  --   --  39.8  --   --   PLT  --   --  219  --   --   LABPROT  --   --  19.1* 21.9* 21.6*  INR  --   --  1.66* 1.98* 1.94*  CREATININE  --   --  0.95 1.21 1.28  TROPONINI <0.30 <0.30 <0.30  --   --     Estimated Creatinine Clearance: 72.4 ml/min (by C-G formula based on Cr of 1.28).   Medical History: Past Medical History  Diagnosis Date  . Coronary atherosclerosis of native coronary artery     Multivessel, LVEF 65%  . Superficial thrombophlebitis   . Varicose veins   . Diabetes mellitus, type 2   . Hyperlipidemia   . Chronic back pain   . Essential hypertension, benign   . Atrial fibrillation 2011    Postoperative lap chole  . Atrial flutter 2011    Postoperative lap chole    Assessment: [age] YOM presents 3/13 with acute on chronic diastolic heart failure to continue on Coumadin from PTA for Afib/aflutter. INR slightly sub-therapeutic 1.98 on home dose, INR today = 1.94  Pt remains in Afib; per Dr. Ozella AlmondMadera's request, ordered enoxaparin for interim bridging until therapeutic given possible TEE/cardioversion   Coumadin PTA dose 2.5mg  daily except 5mg  TTS  Goal of Therapy:  INR 2-3 Monitor platelets by anticoagulation protocol: Yes   Plan:  - Coumadin 5 mg PO x 1 tonight  - Lovenox 100mg  sq Q12h - Daily PT /  INR - D/C Lovenox when INR > 2   Thank you. Okey RegalLisa Armas Mcbee, PharmD [phone]  06/01/2013 8:43 AM

## 2013-06-01 NOTE — Progress Notes (Signed)
  Echocardiogram Echocardiogram Transesophageal has been performed.  Margreta JourneyLOMBARDO, Basma Buchner 06/01/2013, 10:22 AM

## 2013-06-01 NOTE — Anesthesia Preprocedure Evaluation (Signed)
Anesthesia Evaluation  Patient identified by MRN, date of birth, ID band Patient awake    Reviewed: Allergy & Precautions, H&P , NPO status , Patient's Chart, lab work & pertinent test results  History of Anesthesia Complications Negative for: history of anesthetic complications  Airway Mallampati: I      Dental   Pulmonary neg pulmonary ROS,  breath sounds clear to auscultation        Cardiovascular hypertension, + CAD Rhythm:Irregular Rate:Normal     Neuro/Psych negative neurological ROS     GI/Hepatic negative GI ROS, Neg liver ROS,   Endo/Other  diabetes  Renal/GU negative Renal ROS     Musculoskeletal   Abdominal   Peds  Hematology   Anesthesia Other Findings   Reproductive/Obstetrics                           Anesthesia Physical Anesthesia Plan  ASA: III  Anesthesia Plan: General   Post-op Pain Management:    Induction: Intravenous  Airway Management Planned: Mask  Additional Equipment:   Intra-op Plan:   Post-operative Plan: Extubation in OR  Informed Consent: I have reviewed the patients History and Physical, chart, labs and discussed the procedure including the risks, benefits and alternatives for the proposed anesthesia with the patient or authorized representative who has indicated his/her understanding and acceptance.   Dental advisory given  Plan Discussed with: CRNA and Surgeon  Anesthesia Plan Comments:         Anesthesia Quick Evaluation

## 2013-06-01 NOTE — H&P (Signed)
     INTERVAL PROCEDURE H&P  History and Physical Interval Note:  06/01/2013 8:54 AM  [name] [name] has presented today for their planned procedure. The various methods of treatment have been discussed with the patient and family. After consideration of risks, benefits and other options for treatment, the patient has consented to the procedure.  The patients' outpatient history has been reviewed, patient examined, and no change in status from most recent office note within the past 30 days. I have reviewed the patients' chart and labs and will proceed as planned. Questions were answered to the patient's satisfaction.   Chrystie NoseKenneth C. Hilty, MD, [facility]ownFACC Attending Cardiologist [facility]  HILTY,Kenneth C 06/01/2013, 8:54 AM

## 2013-06-01 NOTE — ED Provider Notes (Signed)
Medical screening examination/treatment/procedure(s) were conducted as a shared visit with non-physician practitioner(s) and myself.  I personally evaluated the patient during the encounter.   EKG Interpretation   Date/Time:  Friday May 29 2013 07:54:19 EDT Ventricular Rate:  135 PR Interval:  118 QRS Duration: 68 QT Interval:  304 QTC Calculation: 456 R Axis:   70 Text Interpretation:  Sinus tachycardia RSR' or QR pattern in V1 suggests  right ventricular conduction delay Cannot rule out Anterior infarct , age  undetermined Abnormal ECG Confirmed by Freida BusmanALLEN  MD, Jakeria Caissie ([id number]) on  05/29/2013 10:46:01 AM       Toy BakerAnthony T Rayn Shorb, MD 06/01/13 [phone]

## 2013-06-01 NOTE — Transfer of Care (Signed)
Immediate Anesthesia Transfer of Care Note  Patient: Volney PresserLarry W Sawtelle  Procedure(s) Performed: Procedure(s) with comments: TRANSESOPHAGEAL ECHOCARDIOGRAM (TEE) (N/A) CARDIOVERSION (N/A) - 9:59 anesthesiologist present, giving Lido 40 mg,IV followed by Propofol 110 mg,IV  10:02 synced cardioversion @ 150 joules, SR with PAC's    Patient Location: PACU and Endoscopy Unit  Anesthesia Type:General  Level of Consciousness: awake, alert  and oriented  Airway & Oxygen Therapy: Patient Spontanous Breathing and Patient connected to nasal cannula oxygen  Post-op Assessment: Report given to PACU RN and Post -op Vital signs reviewed and stable  Post vital signs: Reviewed and stable  Complications: No apparent anesthesia complications

## 2013-06-01 NOTE — Care Management Note (Addendum)
  Page 1 of 1   06/01/2013     5:29:28 PM   CARE MANAGEMENT NOTE 06/01/2013  Patient:  Volney PresserUCKER,Dwayne W   Account Number:  [id number]  Date Initiated:  06/01/2013  Documentation initiated by:  Donato SchultzHUTCHINSON,Izyan Ezzell  Subjective/Objective Assessment:   Admitted with Tachycardia     Action/Plan:   CM to follow for dispositon needs   Anticipated DC Date:  06/01/2013   Anticipated DC Plan:  HOME/SELF CARE         Choice offered to / List presented to:             Status of service:  Completed, signed off Medicare Important Message given?   (If response is "NO", the following Medicare IM given date fields will be blank) Date Medicare IM given:   Date Additional Medicare IM given:    Discharge Disposition:  HOME/SELF CARE  Per UR Regulation:  Reviewed for med. necessity/level of care/duration of stay  If discussed at Long Length of Stay Meetings, dates discussed:    Comments:  06/01/2013 Social:  From home alone.  Independent with ADLs Dispositon Plan: Home/Self Care and Self mgmt for Lovenox 100mg  injections q 12 hours Benefits check - pending CM contacted patients pharmacy: [facility] [address], [place], [place] [address] [phone] CM confirmed Lovenox available for Rx order. CM had pharmacist run Lovenox for co-pay $110.00 CM confirmed with patient that patient has financial resources for Rx: yes Assigned nurse/Laura notified. ADD:  today Donato Schultzrystal Jamielee Mchale RN, BSN, MSHL, CCM 06/01/2013

## 2013-06-02 ENCOUNTER — Encounter (HOSPITAL_COMMUNITY): Payer: Self-pay | Admitting: Internal Medicine

## 2013-06-03 ENCOUNTER — Emergency Department (HOSPITAL_COMMUNITY)
Admission: EM | Admit: 2013-06-03 | Discharge: 2013-06-03 | Disposition: A | Discharge status: Home / Self Care | Payer: BC Managed Care – PPO | Attending: Emergency Medicine | Admitting: Emergency Medicine

## 2013-06-03 ENCOUNTER — Encounter (HOSPITAL_COMMUNITY): Payer: Self-pay | Admitting: Emergency Medicine

## 2013-06-03 ENCOUNTER — Emergency Department (HOSPITAL_COMMUNITY): Payer: BC Managed Care – PPO

## 2013-06-03 DIAGNOSIS — I4892 Unspecified atrial flutter: Secondary | ICD-10-CM

## 2013-06-03 DIAGNOSIS — Z9889 Other specified postprocedural states: Secondary | ICD-10-CM | POA: Insufficient documentation

## 2013-06-03 DIAGNOSIS — Z79899 Other long term (current) drug therapy: Secondary | ICD-10-CM | POA: Insufficient documentation

## 2013-06-03 DIAGNOSIS — E785 Hyperlipidemia, unspecified: Secondary | ICD-10-CM | POA: Insufficient documentation

## 2013-06-03 DIAGNOSIS — IMO0002 Reserved for concepts with insufficient information to code with codable children: Secondary | ICD-10-CM | POA: Insufficient documentation

## 2013-06-03 DIAGNOSIS — Z7901 Long term (current) use of anticoagulants: Secondary | ICD-10-CM | POA: Insufficient documentation

## 2013-06-03 DIAGNOSIS — E162 Hypoglycemia, unspecified: Secondary | ICD-10-CM

## 2013-06-03 DIAGNOSIS — R079 Chest pain, unspecified: Secondary | ICD-10-CM

## 2013-06-03 DIAGNOSIS — I10 Essential (primary) hypertension: Secondary | ICD-10-CM | POA: Insufficient documentation

## 2013-06-03 DIAGNOSIS — I4891 Unspecified atrial fibrillation: Secondary | ICD-10-CM | POA: Insufficient documentation

## 2013-06-03 DIAGNOSIS — N179 Acute kidney failure, unspecified: Secondary | ICD-10-CM

## 2013-06-03 DIAGNOSIS — R609 Edema, unspecified: Secondary | ICD-10-CM | POA: Insufficient documentation

## 2013-06-03 DIAGNOSIS — G8929 Other chronic pain: Secondary | ICD-10-CM | POA: Insufficient documentation

## 2013-06-03 DIAGNOSIS — Z951 Presence of aortocoronary bypass graft: Secondary | ICD-10-CM | POA: Insufficient documentation

## 2013-06-03 DIAGNOSIS — I2510 Atherosclerotic heart disease of native coronary artery without angina pectoris: Secondary | ICD-10-CM

## 2013-06-03 DIAGNOSIS — E1169 Type 2 diabetes mellitus with other specified complication: Secondary | ICD-10-CM | POA: Insufficient documentation

## 2013-06-03 DIAGNOSIS — Z8672 Personal history of thrombophlebitis: Secondary | ICD-10-CM | POA: Insufficient documentation

## 2013-06-03 LAB — CBG MONITORING, ED
GLUCOSE-CAPILLARY: 60 mg/dL — AB (ref 70–99)
GLUCOSE-CAPILLARY: 84 mg/dL (ref 70–99)

## 2013-06-03 LAB — I-STAT TROPONIN, ED
Troponin i, poc: 0.01 ng/mL (ref 0.00–0.08)
Troponin i, poc: 0.01 ng/mL (ref 0.00–0.08)

## 2013-06-03 LAB — PROTIME-INR
INR: 2.72 — ABNORMAL HIGH (ref 0.00–1.49)
Prothrombin Time: 27.9 seconds — ABNORMAL HIGH (ref 11.6–15.2)

## 2013-06-03 LAB — CBC
HCT: 39.5 % (ref 39.0–52.0)
Hemoglobin: 13.4 g/dL (ref 13.0–17.0)
MCH: 37.9 pg — ABNORMAL HIGH (ref 26.0–34.0)
MCHC: 33.9 g/dL (ref 30.0–36.0)
MCV: 111.6 fL — ABNORMAL HIGH (ref 78.0–100.0)
Platelets: 242 10*3/uL (ref 150–400)
RBC: 3.54 MIL/uL — ABNORMAL LOW (ref 4.22–5.81)
RDW: 15.5 % (ref 11.5–15.5)
WBC: 9.5 10*3/uL (ref 4.0–10.5)

## 2013-06-03 LAB — BASIC METABOLIC PANEL
BUN: 36 mg/dL — AB (ref 6–23)
CO2: 27 mEq/L (ref 19–32)
CREATININE: 1.53 mg/dL — AB (ref 0.50–1.35)
Calcium: 9 mg/dL (ref 8.4–10.5)
Chloride: 97 mEq/L (ref 96–112)
GFR calc Af Amer: 54 mL/min — ABNORMAL LOW (ref >90)
GFR, EST NON AFRICAN AMERICAN: 46 mL/min — AB (ref >90)
Glucose, Bld: 60 mg/dL — ABNORMAL LOW (ref 70–99)
Potassium: 3.4 mEq/L — ABNORMAL LOW (ref 3.7–5.3)
SODIUM: 141 meq/L (ref 137–147)

## 2013-06-03 MED ORDER — FUROSEMIDE 40 MG PO TABS: 40.0000 mg | ORAL_TABLET | Freq: Every day | ORAL | Status: DC

## 2013-06-03 MED ORDER — ASPIRIN 325 MG PO TABS
325.0000 mg | ORAL_TABLET | Freq: Once | ORAL | Status: AC
  Administered 2013-06-03: 325 mg via ORAL
  Filled 2013-06-03: qty 1

## 2013-06-03 MED ORDER — NITROGLYCERIN 0.4 MG SL SUBL
0.4000 mg | SUBLINGUAL_TABLET | SUBLINGUAL | Status: DC | PRN
Start: 2013-06-03 — End: 2013-06-03
  Administered 2013-06-03 (×2): 0.4 mg via SUBLINGUAL
  Filled 2013-06-03: qty 1

## 2013-06-03 MED ORDER — WARFARIN SODIUM 5 MG PO TABS: 2.5000 mg | ORAL_TABLET | ORAL | Status: DC

## 2013-06-03 MED ORDER — DEXTROSE 50 % IV SOLN
25.0000 mL | Freq: Once | INTRAVENOUS | Status: AC
  Administered 2013-06-03: 25 mL via INTRAVENOUS
  Filled 2013-06-03: qty 50

## 2013-06-03 NOTE — ED Notes (Signed)
Pt. woke up this evening with chest tightness , diaphoresis and  slight SOB . Denies nausea . Pt. stated history of CAD /CABG ,his cardiologist is Dr. [name]

## 2013-06-03 NOTE — ED Notes (Signed)
Pt sts he feels like he has pressure in his chest but denies pain.

## 2013-06-03 NOTE — ED Notes (Signed)
Hilty, MD cardiology at bedside.

## 2013-06-03 NOTE — Discharge Instructions (Signed)
Atrial Fibrillation Atrial fibrillation is a type of irregular heart rhythm (arrhythmia). During atrial fibrillation, the upper chambers of the heart (atria) quiver continuously in a chaotic pattern. This causes an irregular and often rapid heart rate.  Atrial fibrillation is the result of the heart becoming overloaded with disorganized signals that tell it to beat. These signals are normally released one at a time by a part of the right atrium called the sinoatrial node. They then travel from the atria to the lower chambers of the heart (ventricles), causing the atria and ventricles to contract and pump blood as they pass. In atrial fibrillation, parts of the atria outside of the sinoatrial node also release these signals. This results in two problems. First, the atria receive so many signals that they do not have time to fully contract. Second, the ventricles, which can only receive one signal at a time, beat irregularly and out of rhythm with the atria.  There are three types of atrial fibrillation:   Paroxysmal Paroxysmal atrial fibrillation starts suddenly and stops on its own within a week.   Persistent Persistent atrial fibrillation lasts for more than a week. It may stop on its own or with treatment.   Permanent Permanent atrial fibrillation does not go away. Episodes of atrial fibrillation may lead to permanent atrial fibrillation.  Atrial fibrillation can prevent your heart from pumping blood normally. It increases your risk of stroke and can lead to heart failure.  CAUSES   Heart conditions, including a heart attack, heart failure, coronary artery disease, and heart valve conditions.   Inflammation of the sac that surrounds the heart (pericarditis).   Blockage of an artery in the lungs (pulmonary embolism).   Pneumonia or other infections.   Chronic lung disease.   Thyroid problems, especially if the thyroid is overactive (hyperthyroidism).   Caffeine, excessive alcohol  use, and use of some illegal drugs.   Use of some medications, including certain decongestants and diet pills.   Heart surgery.   Birth defects.  Sometimes, no cause can be found. When this happens, the atrial fibrillation is called lone atrial fibrillation. The risk of complications from atrial fibrillation increases if you have lone atrial fibrillation and you are age 26 years or older. RISK FACTORS  Heart failure.  Coronary artery disease  Diabetes mellitus.   High blood pressure (hypertension).   Obesity.   Other arrhythmias.   Increased age. SYMPTOMS   A feeling that your heart is beating rapidly or irregularly.   A feeling of discomfort or pain in your chest.   Shortness of breath.   Sudden lightheadedness or weakness.   Getting tired easily when exercising.   Urinating more often than normal (mainly when atrial fibrillation first begins).  In paroxysmal atrial fibrillation, symptoms may start and suddenly stop. DIAGNOSIS  Your caregiver may be able to detect atrial fibrillation when taking your pulse. Usually, testing is needed to diagnosis atrial fibrillation. Tests may include:   Electrocardiography. During this test, the electrical impulses of your heart are recorded while you are lying down.   Echocardiography. During echocardiography, sound waves are used to evaluate how blood flows through your heart.   Stress test. There is more than one type of stress test. If a stress test is needed, ask your caregiver about which type is best for you.   Chest X-ray exam.   Blood tests.   Computed tomography (CT).  TREATMENT   Treating any underlying conditions. For example, if you have an overactive  thyroid, treating the condition may correct atrial fibrillation.   Medication. Medications may be given to control a rapid heart rate or to prevent blood clots, heart failure, or a stroke.   Procedure to correct the rhythm of the  heart:  Electrical cardioversion. During electrical cardioversion, a controlled, low-energy shock is delivered to the heart through your skin. If you have chest pain, very low pressure blood pressure, or sudden heart failure, this procedure may need to be done as an emergency.  Catheter ablation. During this procedure, heart tissues that send the signals that cause atrial fibrillation are destroyed.  Maze or minimaze procedure. During this surgery, thin lines of heart tissue that carry the abnormal signals are destroyed. The maze procedure is an open-heart surgery. The minimaze procedure is a minimally invasive surgery. This means that small cuts are made to access the heart instead of a large opening.  Pulmonary venous isolation. During this surgery, tissue around the veins that carry blood from the lungs (pulmonary veins) is destroyed. This tissue is thought to carry the abnormal signals. HOME CARE INSTRUCTIONS   Take medications as directed by your caregiver.  Only take medications that your caregiver approves. Some medications can make atrial fibrillation worse or recur.  If blood thinners were prescribed by your caregiver, take them exactly as directed. Too much can cause bleeding. Too little and you will not have the needed protection against stroke and other problems.  Perform blood tests at home if directed by your caregiver.  Perform blood tests exactly as directed.   Quit smoking if you smoke.   Do not drink alcohol.   Do not drink caffeinated beverages such as coffee, soda, and some teas. You may drink decaffeinated coffee, soda, or tea.   Maintain a healthy weight. Do not use diet pills unless your caregiver approves. They may make heart problems worse.   Follow diet instructions as directed by your caregiver.   Exercise regularly as directed by your caregiver.   Keep all follow-up appointments. PREVENTION  The following substances can cause atrial fibrillation  to recur:   Caffeinated beverages.   Alcohol.   Certain medications, especially those used for breathing problems.   Certain herbs and herbal medications, such as those containing ephedra or ginseng.  Illegal drugs such as cocaine and amphetamines. Sometimes medications are given to prevent atrial fibrillation from recurring. Proper treatment of any underlying condition is also important in helping prevent recurrence.  SEEK MEDICAL CARE IF:  You notice a change in the rate, rhythm, or strength of your heartbeat.   You suddenly begin urinating more frequently.   You tire more easily when exerting yourself or exercising.  SEEK IMMEDIATE MEDICAL CARE IF:   You develop chest pain, abdominal pain, sweating, or weakness.  You feel sick to your stomach (nauseous).  You develop shortness of breath.  You suddenly develop swollen feet and ankles.  You feel dizzy.  You face or limbs feel numb or weak.  There is a change in your vision or speech. MAKE SURE YOU:   Understand these instructions.  Will watch your condition.  Will get help right away if you are not doing well or get worse. Document Released: 03/05/2005 Document Revised: 06/30/2012 Document Reviewed: 04/15/2012 [facility] Patient Information 2014 [place], [place].  Hypoglycemia (Low Blood Sugar) Hypoglycemia is when the glucose (sugar) in your blood is too low. Hypoglycemia can happen for many reasons. It can happen to people with or without diabetes. Hypoglycemia can develop quickly and can  be a medical emergency.  CAUSES  Having hypoglycemia does not mean that you will develop diabetes. Different causes include:  Missed or delayed meals or not enough carbohydrates eaten.  Medication overdose. This could be by accident or deliberate. If by accident, your medication may need to be adjusted or changed.  Exercise or increased activity without adjustments in carbohydrates or medications.  A nerve disorder that  affects body functions like your heart rate, blood pressure and digestion (autonomic neuropathy).  A condition where the stomach muscles do not function properly (gastroparesis). Therefore, medications may not absorb properly.  The inability to recognize the signs of hypoglycemia (hypoglycemic unawareness).  Absorption of insulin  may be altered.  Alcohol consumption.  Pregnancy/menstrual cycles/postpartum. This may be due to hormones.  Certain kinds of tumors. This is very rare. SYMPTOMS   Sweating.  Hunger.  Dizziness.  Blurred vision.  Drowsiness.  Weakness.  Headache.  Rapid heart beat.  Shakiness.  Nervousness. DIAGNOSIS  Diagnosis is made by monitoring blood glucose in one or all of the following ways:  Fingerstick blood glucose monitoring.  Laboratory results. TREATMENT  If you think your blood glucose is low:  Check your blood glucose, if possible. If it is less than 70 mg/dl, take one of the following:  3-4 glucose tablets.   cup juice (prefer clear like apple).   cup "regular" soda pop.  1 cup milk.  -1 tube of glucose gel.  5-6 hard candies.  Do not over treat because your blood glucose (sugar) will only go too high.  Wait 15 minutes and recheck your blood glucose. If it is still less than 70 mg/dl (or below your target range), repeat treatment.  Eat a snack if it is more than one hour until your next meal. Sometimes, your blood glucose may go so low that you are unable to treat yourself. You may need someone to help you. You may even pass out or be unable to swallow. This may require you to get an injection of glucagon, which raises the blood glucose. HOME CARE INSTRUCTIONS  Check blood glucose as recommended by your caregiver.  Take medication as prescribed by your caregiver.  Follow your meal plan. Do not skip meals. Eat on time.  If you are going to drink alcohol, drink it only with meals.  Check your blood glucose before  driving.  Check your blood glucose before and after exercise. If you exercise longer or different than usual, be sure to check blood glucose more frequently.  Always carry treatment with you. Glucose tablets are the easiest to carry.  Always wear medical alert jewelry or carry some form of identification that states that you have diabetes. This will alert people that you have diabetes. If you have hypoglycemia, they will have a better idea on what to do. SEEK MEDICAL CARE IF:   You are having problems keeping your blood sugar at target range.  You are having frequent episodes of hypoglycemia.  You feel you might be having side effects from your medicines.  You have symptoms of an illness that is not improving after 3-4 days.  You notice a change in vision or a new problem with your vision. SEEK IMMEDIATE MEDICAL CARE IF:   You are a family member or friend of a person whose blood glucose goes below 70 mg/dl and is accompanied by:  Confusion.  A change in mental status.  The inability to swallow.  Passing out. Document Released: 03/05/2005 Document Revised: 05/28/2011 Document Reviewed:  07/02/2011 [employer] Patient Information 2014 [place], [place].

## 2013-06-03 NOTE — ED Provider Notes (Signed)
Care assumed at the change of shift. Pt seen and evaluated by Dr. Rennis GoldenHilty who did not feel the patient needed inpatient treatment. Medication changes per his recommendation. Pt has a followup appointment scheduled for tomorrow.   Arminda Foglio B. Bernette MayersSheldon, MD 06/03/13 1217

## 2013-06-03 NOTE — ED Notes (Signed)
Bernette MayersSheldon, MD notified re: CBG 60

## 2013-06-03 NOTE — ED Notes (Signed)
Rennis GoldenHilty, MD & Bernette MayersSheldon, MD made aware of CBG, [facility] food, pt at bedside eating [place] sandwich, apple sauce & drinking OJ

## 2013-06-03 NOTE — ED Notes (Signed)
Pt sts he feels some better and doesn't want a third nitro at this time.

## 2013-06-03 NOTE — ED Provider Notes (Signed)
CSN: [id number]     Arrival date & time 06/03/13  0443 History   First MD Initiated Contact with Patient 06/03/13 [phone]     Chief Complaint  Patient presents with  . Chest Pain     (Consider location/radiation/quality/duration/timing/severity/associated sxs/prior Treatment) HPI 63 year old male with history of coronary artery disease atrial fibrillation cardioverted within the last week to sinus rhythm patient on chronic Coumadin recently admitted for heart failure exacerbation with rapid atrial fibrillation; patient did well the last couple days at home but woke up this morning with 4 hours of slight chest pressure with sweats with no shortness of breath no altered mental status no lightheadedness no focal weakness or numbness or change in speech or vision and no new edema. There is no treatment prior to arrival. His symptoms are mild. Past Medical History  Diagnosis Date  . Coronary atherosclerosis of native coronary artery     Multivessel, LVEF 65%  . Superficial thrombophlebitis   . Varicose veins   . Diabetes mellitus, type 2   . Hyperlipidemia   . Chronic back pain   . Essential hypertension, benign   . Atrial fibrillation 2011    Postoperative lap chole  . Atrial flutter 2011    Postoperative lap chole   Past Surgical History  Procedure Laterality Date  . Coronary artery bypass graft  2004    LIMA to LAD, SVG to D1 and OM1, SVG to D3, and SVG to PDA  . Cholecystectomy    . Cardiac catheterization  2008    L [place] 40%, LAD 100%, CFX (dominant) 50%, OM2 80%, RCA OK, All grafts patent, SVG-PDA with 50% stenosis, EF nl  . Tee without cardioversion N/A 06/01/2013    Procedure: TRANSESOPHAGEAL ECHOCARDIOGRAM (TEE);  Surgeon: Chrystie Nose, MD;  Location: [facility] ENDOSCOPY;  Service: Cardiovascular;  Laterality: N/A;  . Cardioversion N/A 06/01/2013    Procedure: CARDIOVERSION;  Surgeon: Chrystie Nose, MD;  Location: [facility] ENDOSCOPY;  Service: Cardiovascular;  Laterality: N/A;  9:59  anesthesiologist present, giving Lido 40 mg,IV followed by Propofol 110 mg,IV  10:02 synced cardioversion @ 150 joules, SR with PAC's     No family history on file. History  Substance Use Topics  . Smoking status: Never Smoker   . Smokeless tobacco: Never Used  . Alcohol Use: 0.0 oz/week    3-4 Shots of liquor, 2-3 Cans of beer per week     Comment: socially    Review of Systems  10 Systems reviewed and are negative for acute change except as noted in the HPI.  Allergies  Shellfish allergy; Fish oil; and Iohexol  Home Medications   Current Outpatient Rx  Name  Route  Sig  Dispense  Refill  . atorvastatin (LIPITOR) 20 MG tablet   Oral   Take 1 tablet (20 mg total) by mouth daily at 6 PM.   30 tablet   1   . levothyroxine (SYNTHROID, LEVOTHROID) 75 MCG tablet   Oral   Take 1 tablet (75 mcg total) by mouth daily before breakfast.   30 tablet   1   . lisinopril (PRINIVIL,ZESTRIL) 40 MG tablet   Oral   Take 40 mg by mouth daily.         . metFORMIN (GLUCOPHAGE) 500 MG tablet   Oral   Take 500 mg by mouth 2 (two) times daily.           . metoprolol (LOPRESSOR) 50 MG tablet   Oral   Take 50 mg  by mouth 2 (two) times daily.         . nitroGLYCERIN (NITROSTAT) 0.4 MG SL tablet   Sublingual   Place 1 tablet (0.4 mg total) under the tongue every 5 (five) minutes as needed. For chest pains.   25 tablet   3   . pantoprazole (PROTONIX) 40 MG tablet   Oral   Take 1 tablet (40 mg total) by mouth daily at 12 noon.   30 tablet   1   . potassium chloride SA (K-DUR,KLOR-CON) 20 MEQ tablet   Oral   Take 1 tablet (20 mEq total) by mouth daily.   30 tablet   1   . predniSONE (DELTASONE) 20 MG tablet   Oral   Take 1 tablet (20 mg total) by mouth 2 (two) times daily with a meal.   16 tablet   0   . diltiazem (CARDIZEM LA) 120 MG 24 hr tablet   Oral   Take 1 tablet (120 mg total) by mouth daily.   90 tablet   3   . furosemide (LASIX) 40 MG tablet   Oral    Take 1 tablet (40 mg total) by mouth daily. For fluid retention.   90 tablet   3   . warfarin (COUMADIN) 5 MG tablet   Oral   Take 0.5-1 tablets (2.5-5 mg total) by mouth See admin instructions. 2.5 mg ( 0.5 tab ) Tuesday Wednesday Friday Saturday Sunday  and 5 mg on Monday Thursday   30 tablet   0    BP 151/77  Pulse 83  Temp(Src) 97.5 F (36.4 C) (Oral)  Resp 17  SpO2 95% Physical Exam  Nursing note and vitals reviewed. Constitutional:  Awake, alert, nontoxic appearance.  HENT:  Head: Atraumatic.  Eyes: Right eye exhibits no discharge. Left eye exhibits no discharge.  Neck: Neck supple.  Cardiovascular:  No murmur heard. Irregularly irregular rate and rhythm  Pulmonary/Chest: Effort normal and breath sounds normal. No respiratory distress. He has no wheezes. He has no rales. He exhibits no tenderness.  Abdominal: Soft. Bowel sounds are normal. He exhibits no distension. There is no tenderness. There is no rebound and no guarding.  Musculoskeletal: He exhibits edema. He exhibits no tenderness.  Baseline ROM, no obvious new focal weakness. Trace edema bilateral lower legs with chronic stasis dermatitis.  Neurological:  Mental status and motor strength appears baseline for patient and situation.  Skin: No rash noted.  Psychiatric: He has a normal mood and affect.    ED Course  Procedures (including critical care time) Pt stable in ED with no significant deterioration in condition.Patient informed of clinical course, understand medical decision-making process, and agree with plan.Still has slight vague almost resolved chest pressure unchanged after NTG; d/w Cards who will see Pt in ED for Dispo. 0805 Labs Review Labs Reviewed  CBC - Abnormal; Notable for the following:    RBC 3.54 (*)    MCV 111.6 (*)    MCH 37.9 (*)    All other components within normal limits  BASIC METABOLIC PANEL - Abnormal; Notable for the following:    Potassium 3.4 (*)    Glucose, Bld 60 (*)     BUN 36 (*)    Creatinine, Ser 1.53 (*)    GFR calc non Af Amer 46 (*)    GFR calc Af Amer 54 (*)    All other components within normal limits  PROTIME-INR - Abnormal; Notable for the following:    Prothrombin Time  27.9 (*)    INR 2.72 (*)    All other components within normal limits  CBG MONITORING, ED - Abnormal; Notable for the following:    Glucose-Capillary 60 (*)    All other components within normal limits  I-STAT TROPOININ, ED  I-STAT TROPOININ, ED  CBG MONITORING, ED   Imaging Review No results found.   EKG Interpretation   Date/Time:  Wednesday June 03 2013 04:47:55 EDT Ventricular Rate:  86 PR Interval:  190 QRS Duration: 74 QT Interval:  418 QTC Calculation: 500 R Axis:   51 Text Interpretation:  Atrial fibrillation Premature ventricular complexes  Nonspecific T wave abnormality Prolonged QT Compared to previous tracing  Atrial fibrillation NOW PRESENT Confirmed by Fonnie Jarvis  MD, Jonny Ruiz ([id number]) on  06/03/2013 7:34:09 AM      MDM   Final diagnoses:  Atrial flutter  Hypoglycemia    The patient appears reasonably stabilized for admission considering the current resources, flow, and capabilities available in the ED at this time, and I doubt any other [facility] requiring further screening and/or treatment in the ED prior to admission.    Hurman Horn, MD 06/05/13 [phone]

## 2013-06-03 NOTE — Consult Note (Signed)
CONSULTATION NOTE  Reason for Consult: Chest pain, ?a-fib  Requesting Physician: Dr. Stevie Kern  Cardiologist: Domenic Polite  HPI: This is a 63 y.o. male with a past medical history significant for is a 63 yo male we are asked to see re CHF. He has been seen by S mcDowell in clinic back in December HIstory of CAD, Atrial flutter (periop) and HTN.  He presented to ER complaining of palpitations and SOB Had LE swelling x 1 wk BP and heart rate have been elevated for 2 days  Patient has history of CAD (s/p CABG in 2004 (LIMA to LAD; SVG to DIag/OM; SVG to Diag; SVG to PDA) Last cath in 2008: 100% LAD; 50% LCx (dominant); 80% OM2; RCA ok Grafts patent. SVG to PDA with 50% LVEF normal  Patient also has a history of HTN, HL, and atrial flutter (on coumadin).  He was noted to have recurrent atrial flutter with RVR.  He was diuresed and then underwent TEE/Cardioversion by myself on 06/01/2013. This resulted in reduction in HR to the 60-70 with a low atrial rhythm (indistinct p-wave) and PAC's. There is a 1st degree AVB.  EF was preserved at 55%.  Prior to discharge his actos was discontinued (due to diastolic CHF) and he was put on Amaryl in addition to metformin.   He reports that he was awakened with some chest tightness, diaphoresis and nausea. He was scared and came back to the ER. He was found to be relatively hypoglycemic with a BG of 60.  He was given 1/2 amp d50 and his symptoms improved. Repeat BG was also 60 (5 hours later). INR today is noted to be up to 2.72 (from 1.94 on 3/16). Troponin is negative x 2. Creatinine is increased to 1.53.  CXR is unremarkable.  PMHx:  Past Medical History  Diagnosis Date  . Coronary atherosclerosis of native coronary artery     Multivessel, LVEF 65%  . Superficial thrombophlebitis   . Varicose veins   . Diabetes mellitus, type 2   . Hyperlipidemia   . Chronic back pain   . Essential hypertension, benign   . Atrial fibrillation 2011    Postoperative lap  chole  . Atrial flutter 2011    Postoperative lap chole   Past Surgical History  Procedure Laterality Date  . Coronary artery bypass graft  2004    LIMA to LAD, SVG to D1 and OM1, SVG to D3, and SVG to PDA  . Cholecystectomy    . Cardiac catheterization  2008    L [place] 40%, LAD 100%, CFX (dominant) 50%, OM2 80%, RCA OK, All grafts patent, SVG-PDA with 50% stenosis, EF nl  . Tee without cardioversion N/A 06/01/2013    Procedure: TRANSESOPHAGEAL ECHOCARDIOGRAM (TEE);  Surgeon: Pixie Casino, MD;  Location: [facility];  Service: Cardiovascular;  Laterality: N/A;  . Cardioversion N/A 06/01/2013    Procedure: CARDIOVERSION;  Surgeon: Pixie Casino, MD;  Location: [facility] ENDOSCOPY;  Service: Cardiovascular;  Laterality: N/A;  9:59 anesthesiologist present, giving Lido 40 mg,IV followed by Propofol 110 mg,IV  10:02 synced cardioversion @ 150 joules, SR with PAC's      FAMHx: No family history on file.  SOCHx:  reports that he has never smoked. He has never used smokeless tobacco. He reports that he drinks alcohol. He reports that he does not use illicit drugs.  ALLERGIES: Allergies  Allergen Reactions  . Shellfish Allergy Anaphylaxis  . Fish Oil Nausea And Vomiting  . Iohexol  Desc: THROAT SWELLING-REQUIRES 13 HR PREP     ROS: A comprehensive review of systems was negative except for: Constitutional: positive for malaise and diaphorsesis Cardiovascular: positive for chest pressure/discomfort  HOME MEDICATIONS:   Medication List    ASK your doctor about these medications       atorvastatin 20 MG tablet  Commonly known as:  LIPITOR  Take 1 tablet (20 mg total) by mouth daily at 6 PM.     diltiazem 120 MG 24 hr tablet  Commonly known as:  CARDIZEM LA  Take 1 tablet (120 mg total) by mouth daily.     enoxaparin 100 MG/ML injection  Commonly known as:  LOVENOX  Inject 1 mL (100 mg total) into the skin every 12 (twelve) hours.     furosemide 40 MG tablet  Commonly  known as:  LASIX  Take 1 tablet (40 mg total) by mouth 2 (two) times daily. For fluid retention.     glimepiride 4 MG tablet  Commonly known as:  AMARYL  Take 1 tablet (4 mg total) by mouth daily with breakfast.     levothyroxine 75 MCG tablet  Commonly known as:  SYNTHROID, LEVOTHROID  Take 1 tablet (75 mcg total) by mouth daily before breakfast.     lisinopril 40 MG tablet  Commonly known as:  PRINIVIL,ZESTRIL  Take 40 mg by mouth daily.     metFORMIN 500 MG tablet  Commonly known as:  GLUCOPHAGE  Take 500 mg by mouth 2 (two) times daily.     metoprolol 50 MG tablet  Commonly known as:  LOPRESSOR  Take 50 mg by mouth 2 (two) times daily.     nitroGLYCERIN 0.4 MG SL tablet  Commonly known as:  NITROSTAT  Place 1 tablet (0.4 mg total) under the tongue every 5 (five) minutes as needed. For chest pains.     pantoprazole 40 MG tablet  Commonly known as:  PROTONIX  Take 1 tablet (40 mg total) by mouth daily at 12 noon.     potassium chloride SA 20 MEQ tablet  Commonly known as:  K-DUR,KLOR-CON  Take 1 tablet (20 mEq total) by mouth daily.     predniSONE 20 MG tablet  Commonly known as:  DELTASONE  Take 1 tablet (20 mg total) by mouth 2 (two) times daily with a meal.     warfarin 5 MG tablet  Commonly known as:  COUMADIN  Take 2.5-5 mg by mouth See admin instructions. 2.5 mg ( 0.5 tab )  Monday Wednesday Friday Sunday  and 5 mg on Tuesday Thursday Saturday.       HOSPITAL MEDICATIONS: None  VITALS: Blood pressure 135/74, pulse 62, temperature 97.5 F (36.4 C), temperature source Oral, resp. rate 16, SpO2 96.00%.  PHYSICAL EXAM: General appearance: alert and no distress Neck: no carotid bruit, no JVD and thick Lungs: clear to auscultation bilaterally Heart: regular rate and rhythm and occasional ectopy Abdomen: soft, non-tender; bowel sounds normal; no masses,  no organomegaly and obese Extremities: extremities normal, atraumatic, no cyanosis or edema Pulses: 2+  and symmetric Skin: Skin color, texture, turgor normal. No rashes or lesions Neurologic: Grossly normal Psych: Very anxious  LABS: Results for orders placed during the hospital encounter of 06/03/13 (from the past 48 hour(s))  CBC     Status: Abnormal   Collection Time    06/03/13  5:07 AM      Result Value Ref Range   WBC 9.5  4.0 - 10.5 K/uL  RBC 3.54 (*) 4.22 - 5.81 MIL/uL   Hemoglobin 13.4  13.0 - 17.0 g/dL   HCT 39.5  39.0 - 52.0 %   MCV 111.6 (*) 78.0 - 100.0 fL   MCH 37.9 (*) 26.0 - 34.0 pg   MCHC 33.9  30.0 - 36.0 g/dL   RDW 15.5  11.5 - 15.5 %   Platelets 242  150 - 400 K/uL  BASIC METABOLIC PANEL     Status: Abnormal   Collection Time    06/03/13  5:07 AM      Result Value Ref Range   Sodium 141  137 - 147 mEq/L   Potassium 3.4 (*) 3.7 - 5.3 mEq/L   Chloride 97  96 - 112 mEq/L   CO2 27  19 - 32 mEq/L   Glucose, Bld 60 (*) 70 - 99 mg/dL   BUN 36 (*) 6 - 23 mg/dL   Creatinine, Ser 1.53 (*) 0.50 - 1.35 mg/dL   Calcium 9.0  8.4 - 10.5 mg/dL   GFR calc non Af Amer 46 (*) >90 mL/min   GFR calc Af Amer 54 (*) >90 mL/min   Comment: (NOTE)     The eGFR has been calculated using the CKD EPI equation.     This calculation has not been validated in all clinical situations.     eGFR's persistently <90 mL/min signify possible Chronic Kidney     Disease.  Randolm Idol, ED     Status: None   Collection Time    06/03/13  5:14 AM      Result Value Ref Range   Troponin i, poc 0.01  0.00 - 0.08 ng/mL   Comment 3            Comment: Due to the release kinetics of cTnI,     a negative result within the first hours     of the onset of symptoms does not rule out     myocardial infarction with certainty.     If myocardial infarction is still suspected,     repeat the test at appropriate intervals.  PROTIME-INR     Status: Abnormal   Collection Time    06/03/13  5:47 AM      Result Value Ref Range   Prothrombin Time 27.9 (*) 11.6 - 15.2 seconds   INR 2.72 (*) 0.00 - 1.49    I-STAT TROPOININ, ED     Status: None   Collection Time    06/03/13  9:19 AM      Result Value Ref Range   Troponin i, poc 0.01  0.00 - 0.08 ng/mL   Comment 3            Comment: Due to the release kinetics of cTnI,     a negative result within the first hours     of the onset of symptoms does not rule out     myocardial infarction with certainty.     If myocardial infarction is still suspected,     repeat the test at appropriate intervals.  CBG MONITORING, ED     Status: Abnormal   Collection Time    06/03/13  9:55 AM      Result Value Ref Range   Glucose-Capillary 60 (*) 70 - 99 mg/dL   Comment 1 Documented in Chart     Comment 2 Notify RN      IMAGING: Dg Chest Port 1 View  06/03/2013   CLINICAL DATA:  Chest discomfort.  EXAM:  PORTABLE CHEST - 1 VIEW  COMPARISON:  PA and lateral chest 05/29/2013 and 11/25/2012.  FINDINGS: The patient is status post CABG. There is cardiomegaly without edema. Minimal atelectasis left lung base noted. The lungs are otherwise clear. No pneumothorax or pleural effusion.  IMPRESSION: Cardiomegaly without acute disease.   Electronically Signed   By: Inge Rise M.D.   On: 06/03/2013 05:47   EKG: Low atrial rhythm vs. Wandering atrial pacemaker, 1st degree AVB, PAC's.  HOSPITAL DIAGNOSES: Active Problems:   * No active hospital problems. *   IMPRESSION: 1. Chest pressure/diaphoresis 2. Recent a-flutter, maintaining low atrial rhythm after cardioversion 3. Therapeutic INR on warfarin 4. Mild AKI 5. Hypoglycemia  RECOMMENDATION: 1. Mr. Virden had some chest pressure and diaphoresis associated with hypoglycemia. I do not believe he is back in a-fib (rhythm was difficult to ascertain after cardioversion with atypical p-waves, but rate is much slower than before cardioversion and is stable).  I think his symptoms may be attributable to hypoglycemia with the addition of Amaryl and discontinuation of his actos.  He has ruled-out for ACS. His chest  pressure has resolved. Given the fact that he has recently had recurrent a-flutter and has a history of CAD/CABG, he should have an ischemia work-up, however, I feel this could be accomplished as an outpatient. He has an appointment tomorrow in the [place] office.  Would recommend an outpatient lexiscan nuclear stress test. 2. Would discontinue Amaryl. Continue metformin. Will need to follow-up with Dr. Buddy Duty to adjust his diabetes medications. 3. INR is rising quickly .Marland Kitchen He can stop lovenox bridging injections. Would adjust discharge warfarin regimen as follows:  2.5 mg daily, except 5 mg on Monday and Thursday.  4. Mild AKI - decrease lasix to 40 mg daily.  Thanks for consulting [place].  Time Spent Directly with Patient: 30 minutes  Pixie Casino, MD, [facility] Attending Cardiologist [facility]  Deshana Rominger C 06/03/2013, 10:10 AM

## 2013-06-04 ENCOUNTER — Ambulatory Visit (INDEPENDENT_AMBULATORY_CARE_PROVIDER_SITE_OTHER): Payer: BC Managed Care – PPO | Admitting: *Deleted

## 2013-06-04 ENCOUNTER — Encounter: Payer: Self-pay | Admitting: Adult Health

## 2013-06-04 ENCOUNTER — Ambulatory Visit (INDEPENDENT_AMBULATORY_CARE_PROVIDER_SITE_OTHER): Payer: BC Managed Care – PPO | Admitting: Adult Health

## 2013-06-04 VITALS — BP 137/69 | HR 63 | Ht 70.0 in | Wt 249.0 lb

## 2013-06-04 DIAGNOSIS — R600 Localized edema: Secondary | ICD-10-CM

## 2013-06-04 DIAGNOSIS — I2510 Atherosclerotic heart disease of native coronary artery without angina pectoris: Secondary | ICD-10-CM

## 2013-06-04 DIAGNOSIS — R609 Edema, unspecified: Secondary | ICD-10-CM

## 2013-06-04 DIAGNOSIS — I4892 Unspecified atrial flutter: Secondary | ICD-10-CM

## 2013-06-04 DIAGNOSIS — Z7901 Long term (current) use of anticoagulants: Secondary | ICD-10-CM

## 2013-06-04 DIAGNOSIS — I10 Essential (primary) hypertension: Secondary | ICD-10-CM

## 2013-06-04 LAB — POCT INR: INR: 2.2

## 2013-06-04 MED ORDER — FUROSEMIDE 40 MG PO TABS: 40.0000 mg | ORAL_TABLET | Freq: Every day | ORAL | Status: AC

## 2013-06-04 MED ORDER — DILTIAZEM HCL ER COATED BEADS 120 MG PO TB24: 120.0000 mg | ORAL_TABLET | Freq: Every day | ORAL | Status: DC

## 2013-06-04 MED ORDER — FUROSEMIDE 40 MG PO TABS: 40.0000 mg | ORAL_TABLET | Freq: Every day | ORAL | Status: DC

## 2013-06-04 NOTE — Assessment & Plan Note (Signed)
No recurrent chest pain or weakness. Continue current medications.

## 2013-06-04 NOTE — Progress Notes (Deleted)
Name: [name] PresserLarry W Ruda    DOB: [birth year]  Age: 63 y.o.  MR#: [id number]       PCP:  Talmage CoinKERR,JEFFREY, MD      Insurance: Payor: BLUE CROSS BLUE [employer]IELD / Plan: BCBS PPO OUT OF STATE / Product Type: *No Product type* /   CC:    Chief Complaint  Patient presents with  . Congestive Heart Failure  . Hypertension    VS Filed Vitals:   06/04/13 1423  BP: 137/69  Pulse: 63  Height: 5\' 10"  (1.778 m)  Weight: 249 lb (112.946 kg)  SpO2: 100%    Weights Current Weight  06/04/13 249 lb (112.946 kg)  06/01/13 242 lb 11.6 oz (110.1 kg)  06/01/13 242 lb 11.6 oz (110.1 kg)    Blood Pressure  BP Readings from Last 3 Encounters:  06/04/13 137/69  06/03/13 151/77  06/01/13 114/59     Admit date:  (Not on file) Last encounter with RMR:  Visit date not found   Allergy Shellfish allergy; Fish oil; and Iohexol  Current Outpatient Prescriptions  Medication Sig Dispense Refill  . atorvastatin (LIPITOR) 20 MG tablet Take 1 tablet (20 mg total) by mouth daily at 6 PM.  30 tablet  1  . diltiazem (CARDIZEM LA) 120 MG 24 hr tablet Take 1 tablet (120 mg total) by mouth daily.  90 tablet  3  . furosemide (LASIX) 40 MG tablet Take 1 tablet (40 mg total) by mouth daily. For fluid retention.  90 tablet  3  . levothyroxine (SYNTHROID, LEVOTHROID) 75 MCG tablet Take 1 tablet (75 mcg total) by mouth daily before breakfast.  30 tablet  1  . lisinopril (PRINIVIL,ZESTRIL) 40 MG tablet Take 40 mg by mouth daily.      . metFORMIN (GLUCOPHAGE) 500 MG tablet Take 500 mg by mouth 2 (two) times daily.        . metoprolol (LOPRESSOR) 50 MG tablet Take 50 mg by mouth 2 (two) times daily.      . nitroGLYCERIN (NITROSTAT) 0.4 MG SL tablet Place 1 tablet (0.4 mg total) under the tongue every 5 (five) minutes as needed. For chest pains.  25 tablet  3  . pantoprazole (PROTONIX) 40 MG tablet Take 1 tablet (40 mg total) by mouth daily at 12 noon.  30 tablet  1  . potassium chloride SA (K-DUR,KLOR-CON) 20 MEQ tablet Take 1 tablet  (20 mEq total) by mouth daily.  30 tablet  1  . predniSONE (DELTASONE) 20 MG tablet Take 1 tablet (20 mg total) by mouth 2 (two) times daily with a meal.  16 tablet  0  . warfarin (COUMADIN) 5 MG tablet Take 0.5-1 tablets (2.5-5 mg total) by mouth See admin instructions. 2.5 mg ( 0.5 tab ) Tuesday Wednesday Friday Saturday Sunday  and 5 mg on Monday Thursday  30 tablet  0  . [DISCONTINUED] enoxaparin (LOVENOX) 100 MG/ML injection Inject 1 mL (100 mg total) into the skin every 12 (twelve) hours.  10 mL  0  . [DISCONTINUED] glimepiride (AMARYL) 4 MG tablet Take 1 tablet (4 mg total) by mouth daily with breakfast.  30 tablet  1   No current facility-administered medications for this visit.    Discontinued Meds:    Medications Discontinued During This Encounter  Medication Reason  . diltiazem (CARDIZEM LA) 120 MG 24 hr tablet Reorder  . furosemide (LASIX) 40 MG tablet Reorder  . diltiazem (CARDIZEM LA) 120 MG 24 hr tablet Reorder  . furosemide (LASIX) 40  MG tablet Reorder    Patient Active Problem List   Diagnosis Date Noted  . Tachycardia 05/29/2013  . Acute on chronic diastolic heart failure 05/29/2013  . Lower extremity edema 05/29/2013  . Encounter for long-term (current) use of anticoagulants 07/06/2010  . Essential hypertension, benign 10/06/2009  . Atrial flutter 10/06/2009  . Mixed hyperlipidemia 06/17/2009  . CAD, NATIVE VESSEL 01/03/2009  . EDEMA 01/03/2009    LABS    Component Value Date/Time   NA 141 06/03/2013 0507   NA 139 06/01/2013 0420   NA 141 05/31/2013 0430   K 3.4* 06/03/2013 0507   K 4.5 06/01/2013 0420   K 3.7 05/31/2013 0430   CL 97 06/03/2013 0507   CL 99 06/01/2013 0420   CL 95* 05/31/2013 0430   CO2 27 06/03/2013 0507   CO2 19 06/01/2013 0420   CO2 27 05/31/2013 0430   GLUCOSE 60* 06/03/2013 0507   GLUCOSE 89 06/01/2013 0420   GLUCOSE 101* 05/31/2013 0430   BUN 36* 06/03/2013 0507   BUN 29* 06/01/2013 0420   BUN 19 05/31/2013 0430   CREATININE 1.53* 06/03/2013  0507   CREATININE 1.28 06/01/2013 0420   CREATININE 1.21 05/31/2013 0430   CREATININE 1.26 08/21/2012 1550   CALCIUM 9.0 06/03/2013 0507   CALCIUM 8.8 06/01/2013 0420   CALCIUM 9.1 05/31/2013 0430   GFRNONAA 46* 06/03/2013 0507   GFRNONAA 58* 06/01/2013 0420   GFRNONAA 62* 05/31/2013 0430   GFRAA 54* 06/03/2013 0507   GFRAA 67* 06/01/2013 0420   GFRAA 71* 05/31/2013 0430   CMP     Component Value Date/Time   NA 141 06/03/2013 0507   K 3.4* 06/03/2013 0507   CL 97 06/03/2013 0507   CO2 27 06/03/2013 0507   GLUCOSE 60* 06/03/2013 0507   BUN 36* 06/03/2013 0507   CREATININE 1.53* 06/03/2013 0507   CREATININE 1.26 08/21/2012 1550   CALCIUM 9.0 06/03/2013 0507   PROT 7.1 11/25/2012 1446   ALBUMIN 3.6 11/25/2012 1446   AST 30 11/25/2012 1446   ALT 26 11/25/2012 1446   ALKPHOS 99 11/25/2012 1446   BILITOT 0.8 11/25/2012 1446   GFRNONAA 46* 06/03/2013 0507   GFRAA 54* 06/03/2013 0507       Component Value Date/Time   WBC 9.5 06/03/2013 0507   WBC 5.9 05/30/2013 0523   WBC 7.8 05/29/2013 0804   HGB 13.4 06/03/2013 0507   HGB 13.3 05/30/2013 0523   HGB 14.0 05/29/2013 0804   HCT 39.5 06/03/2013 0507   HCT 39.8 05/30/2013 0523   HCT 40.9 05/29/2013 0804   MCV 111.6* 06/03/2013 0507   MCV 112.1* 05/30/2013 0523   MCV 111.4* 05/29/2013 0804    Lipid Panel     Component Value Date/Time   CHOL 214* 05/30/2013 0523   TRIG 90 05/30/2013 0523   HDL 85 05/30/2013 0523   CHOLHDL 2.5 05/30/2013 0523   VLDL 18 05/30/2013 0523   LDLCALC 111* 05/30/2013 0523    ABG    Component Value Date/Time   HCO3 27.6* 12/16/2006 1958   TCO2 32 07/12/2008 0702     Lab Results  Component Value Date   TSH 6.071* 05/29/2013   BNP (last 3 results)  Recent Labs  05/29/13 0804 05/31/13 0430  PROBNP 1527.0* 1696.0*   Cardiac Panel (last 3 results) No results found for this basename: CKTOTAL, CKMB, TROPONINI, RELINDX,  in the last 72 hours  Iron/TIBC/Ferritin No results found for this basename: iron, tibc, ferritin  EKG Orders  placed during the hospital encounter of 06/03/13  . EKG 12-LEAD  . EKG 12-LEAD  . ED EKG  . ED EKG  . EKG 12-LEAD  . EKG 12-LEAD     Prior Assessment and Plan Problem List as of 06/04/2013     Cardiovascular and Mediastinum   Essential hypertension, benign   Last Assessment & Plan   02/16/2013 Office Visit Written 02/16/2013  1:35 PM by Jonelle Sidle, MD     Continue follow up with primary care.    CAD, NATIVE VESSEL   Last Assessment & Plan   02/16/2013 Office Visit Written 02/16/2013  1:34 PM by Jonelle Sidle, MD     Symptomatically stable at this point. After discussion, plan is to continue medical therapy and observation.    Atrial flutter   Last Assessment & Plan   02/16/2013 Office Visit Edited 02/16/2013  1:36 PM by Jonelle Sidle, MD     Perioperative, diagnosed in 2011. Plan to continue Coumadin and beta blocker. To have followup INR in the Coumadin clinic today.    Acute on chronic diastolic heart failure     Other   Mixed hyperlipidemia   Last Assessment & Plan   07/18/2012 Office Visit Written 07/18/2012  9:42 AM by Jonelle Sidle, MD     The patient states that labs are checked by Dr. Sharl Ma. Optimally his LDL should be under 100, close to 70 if possible. Would consider statin to help achieve this with documented CAD and diabetes.    EDEMA   Last Assessment & Plan   01/03/2011 Office Visit Written 01/03/2011  1:26 PM by Nyoka Cowden, MD     ? Amalia Hailey contributing ? Needs to be changed at next dm eval by Dr Sharl Ma    Encounter for long-term (current) use of anticoagulants   Tachycardia   Lower extremity edema       Imaging: Dg Chest 2 View  05/29/2013   CLINICAL DATA:  Tachycardia, history of hypertension  EXAM: CHEST  2 VIEW  COMPARISON:  DG CHEST 2 VIEW dated 11/25/2012  FINDINGS: The lungs are adequately inflated. There is no focal infiltrate. The interstitial markings are mildly prominent bilaterally but stable. The cardiopericardial silhouette is normal  in size. The pulmonary vascularity is not engorged. The patient has undergone previous CABG. There is no pleural effusion or pneumothorax. The trachea is midline. The observed portions of the bony thorax exhibit no acute abnormalities.  IMPRESSION: 1. There is mild stable prominence of the pulmonary interstitial markings in the setting of enlargement of the cardiac silhouette. The possibility of low-grade compensated CHF is raised. 2. There is no evidence of pneumonia nor pleural effusion nor pneumothorax.   Electronically Signed   By: David  [place]   On: 05/29/2013 09:22   Dg Chest Port 1 View  06/03/2013   CLINICAL DATA:  Chest discomfort.  EXAM: PORTABLE CHEST - 1 VIEW  COMPARISON:  PA and lateral chest 05/29/2013 and 11/25/2012.  FINDINGS: The patient is status post CABG. There is cardiomegaly without edema. Minimal atelectasis left lung base noted. The lungs are otherwise clear. No pneumothorax or pleural effusion.  IMPRESSION: Cardiomegaly without acute disease.   Electronically Signed   By: Drusilla Kanner M.D.   On: 06/03/2013 05:47

## 2013-06-04 NOTE — Assessment & Plan Note (Signed)
He is well controlled.Continue current mediations

## 2013-06-04 NOTE — Assessment & Plan Note (Signed)
Resolved s/p TEE cardioversion, He remains in NSR. He is continued on warfarin and has had his INR checked today, with results of 2.2. He is medically compliant. He is without complaint of recurrent palpitations.

## 2013-06-04 NOTE — Patient Instructions (Signed)
Your physician recommends that you schedule a follow-up appointment in: 6 months with Dr McDowell You will receive a reminder letter two months in advance reminding you to call and schedule your appointment. If you don't receive this letter, please contact our office.  Your physician recommends that you continue on your current medications as directed. Please refer to the Current Medication list given to you today.   

## 2013-06-04 NOTE — Progress Notes (Signed)
HPI: [name] is a 63 year old patient of Dr. [name] were following for ongoing assessment and management of CAD, history of atrial flutter, and hypertension. The patient will was recently seen on consultation during hospitalization for CHF and in March of 2015. Other history includes diabetes, varicose veins with insufficiency, hyperlipidemia.   The patient was diuresis using IV Lasix, and continued on 40 mg twice a day on discharge. He was continued on metoprolol, he is [place] [address], and started on Synthroid. 2-D echocardiogram demonstrated diastolic heart failure with preserved LVEF no wall motion abnormalities. He was not found to have evidence of ACS at time of admission.     He was continued on metoprolol and Cardizem 120 mg daily. Patient had a TEE cardioversion with return to normal sinus rhythm state at discharge. He was continued on Coumadin. Discharge weight 242 pounds.     He is without complaint. He has stopped his Actos on discharge, and been changed to glimpermide. This caused him to be hypoglycemic and this has been stopped.    He is without complaints. He says he feels great and has more energy.      Allergies  Allergen Reactions  . Shellfish Allergy Anaphylaxis  . Fish Oil Nausea And Vomiting  . Iohexol      Desc: THROAT SWELLING-REQUIRES 13 HR PREP     Current Outpatient Prescriptions  Medication Sig Dispense Refill  . atorvastatin (LIPITOR) 20 MG tablet Take 1 tablet (20 mg total) by mouth daily at 6 PM.  30 tablet  1  . diltiazem (CARDIZEM LA) 120 MG 24 hr tablet Take 1 tablet (120 mg total) by mouth daily.  90 tablet  3  . furosemide (LASIX) 40 MG tablet Take 1 tablet (40 mg total) by mouth daily. For fluid retention.  90 tablet  3  . levothyroxine (SYNTHROID, LEVOTHROID) 75 MCG tablet Take 1 tablet (75 mcg total) by mouth daily before breakfast.  30 tablet  1  . lisinopril (PRINIVIL,ZESTRIL) 40 MG tablet Take 40 mg by mouth daily.      .  metFORMIN (GLUCOPHAGE) 500 MG tablet Take 500 mg by mouth 2 (two) times daily.        . metoprolol (LOPRESSOR) 50 MG tablet Take 50 mg by mouth 2 (two) times daily.      . nitroGLYCERIN (NITROSTAT) 0.4 MG SL tablet Place 1 tablet (0.4 mg total) under the tongue every 5 (five) minutes as needed. For chest pains.  25 tablet  3  . pantoprazole (PROTONIX) 40 MG tablet Take 1 tablet (40 mg total) by mouth daily at 12 noon.  30 tablet  1  . potassium chloride SA (K-DUR,KLOR-CON) 20 MEQ tablet Take 1 tablet (20 mEq total) by mouth daily.  30 tablet  1  . predniSONE (DELTASONE) 20 MG tablet Take 1 tablet (20 mg total) by mouth 2 (two) times daily with a meal.  16 tablet  0  . warfarin (COUMADIN) 5 MG tablet Take 0.5-1 tablets (2.5-5 mg total) by mouth See admin instructions. 2.5 mg ( 0.5 tab ) Tuesday Wednesday Friday Saturday Sunday  and 5 mg on Monday Thursday  30 tablet  0  . [DISCONTINUED] enoxaparin (LOVENOX) 100 MG/ML injection Inject 1 mL (100 mg total) into the skin every 12 (twelve) hours.  10 mL  0  . [DISCONTINUED] glimepiride (AMARYL) 4 MG tablet Take 1 tablet (4 mg total) by mouth daily with breakfast.  30 tablet  1  No current facility-administered medications for this visit.    Past Medical History  Diagnosis Date  . Coronary atherosclerosis of native coronary artery     Multivessel, LVEF 65%  . Superficial thrombophlebitis   . Varicose veins   . Diabetes mellitus, type 2   . Hyperlipidemia   . Chronic back pain   . Essential hypertension, benign   . Atrial fibrillation 2011    Postoperative lap chole  . Atrial flutter 2011    Postoperative lap chole    Past Surgical History  Procedure Laterality Date  . Coronary artery bypass graft  2004    LIMA to LAD, SVG to D1 and OM1, SVG to D3, and SVG to PDA  . Cholecystectomy    . Cardiac catheterization  2008    L Main 40%, LAD 100%, CFX (dominant) 50%, OM2 80%, RCA OK, All grafts patent, SVG-PDA with 50% stenosis, EF nl  . Tee  without cardioversion N/A 06/01/2013    Procedure: TRANSESOPHAGEAL ECHOCARDIOGRAM (TEE);  Surgeon: Chrystie Nose, MD;  Location: [facility] ENDOSCOPY;  Service: Cardiovascular;  Laterality: N/A;  . Cardioversion N/A 06/01/2013    Procedure: CARDIOVERSION;  Surgeon: Chrystie Nose, MD;  Location: [facility] ENDOSCOPY;  Service: Cardiovascular;  Laterality: N/A;  9:59 anesthesiologist present, giving Lido 40 mg,IV followed by Propofol 110 mg,IV  10:02 synced cardioversion @ 150 joules, SR with PAC's      ROS: Review of systems complete and found to be negative unless listed above.  PHYSICAL EXAM BP 137/69  Pulse 63  Ht 5\' 10"  (1.778 m)  Wt 249 lb (112.946 kg)  BMI 35.73 kg/m2  SpO2 100%  General: Well developed, well nourished, in no acute distress, obese.  Head: Eyes PERRLA, No xanthomas.   Normal cephalic and atramatic  Lungs: Clear bilaterally to auscultation and percussion. Heart: HRRR S1 S2, without MRG.  Pulses are 2+ & equal.            No carotid bruit. No JVD.  No abdominal bruits. No femoral bruits. Abdomen: Bowel sounds are positive, abdomen soft and non-tender without masses or                  Hernia's noted. Msk:  Back normal, normal gait. Normal strength and tone for age. Extremities: No clubbing, cyanosis or edema.  DP +1 Neuro: Alert and oriented X 3. Psych:  Good affect, responds appropriately    ASSESSMENT AND PLAN

## 2013-06-04 NOTE — Assessment & Plan Note (Signed)
Much better with resolution of atrial fib. He has a good bit of varicosities in his lower legs and feet. I have advised him to consider supportive socks seen [facility]. He will look for them to see if they would be helpful. He does not wish to be fitted for TED hose.

## 2013-06-11 ENCOUNTER — Encounter: Payer: Self-pay | Admitting: *Deleted

## 2013-06-17 ENCOUNTER — Ambulatory Visit (INDEPENDENT_AMBULATORY_CARE_PROVIDER_SITE_OTHER): Payer: BC Managed Care – PPO | Admitting: *Deleted

## 2013-06-17 DIAGNOSIS — Z7901 Long term (current) use of anticoagulants: Secondary | ICD-10-CM

## 2013-06-17 DIAGNOSIS — I4892 Unspecified atrial flutter: Secondary | ICD-10-CM

## 2013-06-17 DIAGNOSIS — Z5181 Encounter for therapeutic drug level monitoring: Secondary | ICD-10-CM

## 2013-06-17 LAB — POCT INR: INR: 5.7

## 2013-06-29 ENCOUNTER — Other Ambulatory Visit: Payer: Self-pay | Admitting: *Deleted

## 2013-06-29 MED ORDER — POTASSIUM CHLORIDE CRYS ER 20 MEQ PO TBCR
20.0000 meq | EXTENDED_RELEASE_TABLET | Freq: Every day | ORAL | Status: DC
Start: 2013-06-29 — End: 2013-07-01

## 2013-07-01 ENCOUNTER — Other Ambulatory Visit: Payer: Self-pay | Admitting: *Deleted

## 2013-07-01 ENCOUNTER — Ambulatory Visit (INDEPENDENT_AMBULATORY_CARE_PROVIDER_SITE_OTHER): Payer: BC Managed Care – PPO | Admitting: *Deleted

## 2013-07-01 DIAGNOSIS — Z7901 Long term (current) use of anticoagulants: Secondary | ICD-10-CM

## 2013-07-01 DIAGNOSIS — Z5181 Encounter for therapeutic drug level monitoring: Secondary | ICD-10-CM

## 2013-07-01 DIAGNOSIS — I4892 Unspecified atrial flutter: Secondary | ICD-10-CM

## 2013-07-01 LAB — POCT INR: INR: 2.1

## 2013-07-01 MED ORDER — POTASSIUM CHLORIDE CRYS ER 20 MEQ PO TBCR: 20.0000 meq | EXTENDED_RELEASE_TABLET | Freq: Every day | ORAL | Status: AC

## 2013-07-13 ENCOUNTER — Telehealth: Payer: Self-pay | Admitting: Cardiology

## 2013-07-13 NOTE — Telephone Encounter (Signed)
Patient states his HR was 31-35 on yesterday (07/12/13).  Today it is in the 70's. Please return call. / tgs

## 2013-07-13 NOTE — Telephone Encounter (Signed)
Pt called stating that sunday morning he felt dizzy and felt like he was going to black out. Pt checked his pulse and it was 35 checked it again 10 minutes later it was 31. That morning around 5 am the pt took Lisinopril and his metoprolol. Pt felt the dizziness around 10 am. Today (monday) his pulse is around 70-80. Please advise

## 2013-07-14 ENCOUNTER — Ambulatory Visit (INDEPENDENT_AMBULATORY_CARE_PROVIDER_SITE_OTHER): Payer: BC Managed Care – PPO | Admitting: *Deleted

## 2013-07-14 VITALS — HR 78

## 2013-07-14 DIAGNOSIS — R001 Bradycardia, unspecified: Secondary | ICD-10-CM

## 2013-07-14 DIAGNOSIS — I498 Other specified cardiac arrhythmias: Secondary | ICD-10-CM

## 2013-07-14 MED ORDER — METOPROLOL TARTRATE 25 MG PO TABS: 25.0000 mg | ORAL_TABLET | Freq: Two times a day (BID) | ORAL | Status: DC

## 2013-07-14 NOTE — Telephone Encounter (Signed)
Pt made aware to take 25 mg of metoprolol twice a day. Pt states he will need to find a ride to come in for ECG. Pt will call office and let us know when he can come in this week.

## 2013-07-14 NOTE — Telephone Encounter (Signed)
Likely need to reduce dose of metoprolol from 50 mg twice a day to 25 mg twice a day. [facility]. Send me the ECG and I will review it.

## 2013-07-14 NOTE — Patient Instructions (Signed)
Your physician recommends that you schedule a follow-up appointment in: to be determined  

## 2013-07-14 NOTE — Progress Notes (Signed)
Pt here in office for ECG check per Dr [name]. Will place in Dr McDowell's folder for review tomorrow.  KL, NP looked at ECG while pt was here.

## 2013-07-15 NOTE — Progress Notes (Signed)
Spoke with pt this am,he states he stopped Diltiazem two days ago because he felt his pulse was too low.he has reduced metoprolol as discussed Your physician recommends that you continue on your current medications as directed. Please refer to the Current Medication list given to you today. He wants to know if you need to "shock" his heart again because of is a-fib.I encouraged him to go back on the diltiazem and he agreed to do so.

## 2013-07-15 NOTE — Progress Notes (Signed)
I reviewed both ECGs finding rate-controlled atrial fibrillation. Would continue with same plan of reducing the metoprolol which was discussed yesterday. No other change in medication for now.

## 2013-07-29 ENCOUNTER — Ambulatory Visit (INDEPENDENT_AMBULATORY_CARE_PROVIDER_SITE_OTHER): Payer: BC Managed Care – PPO | Admitting: *Deleted

## 2013-07-29 DIAGNOSIS — Z7901 Long term (current) use of anticoagulants: Secondary | ICD-10-CM

## 2013-07-29 DIAGNOSIS — Z5181 Encounter for therapeutic drug level monitoring: Secondary | ICD-10-CM

## 2013-07-29 DIAGNOSIS — I4892 Unspecified atrial flutter: Secondary | ICD-10-CM

## 2013-07-29 LAB — POCT INR: INR: 5.3

## 2013-08-05 ENCOUNTER — Telehealth: Payer: Self-pay | Admitting: Cardiology

## 2013-08-05 ENCOUNTER — Other Ambulatory Visit: Payer: Self-pay | Admitting: Cardiology

## 2013-08-05 NOTE — Telephone Encounter (Signed)
Please see paper in refill bin / tgs  °

## 2013-08-06 MED ORDER — METOPROLOL TARTRATE 25 MG PO TABS: 25.0000 mg | ORAL_TABLET | Freq: Two times a day (BID) | ORAL | Status: DC

## 2013-08-06 NOTE — Telephone Encounter (Signed)
Medication sent via escribe.  

## 2013-08-12 ENCOUNTER — Ambulatory Visit (INDEPENDENT_AMBULATORY_CARE_PROVIDER_SITE_OTHER): Payer: BC Managed Care – PPO | Admitting: *Deleted

## 2013-08-12 DIAGNOSIS — Z7901 Long term (current) use of anticoagulants: Secondary | ICD-10-CM

## 2013-08-12 DIAGNOSIS — Z5181 Encounter for therapeutic drug level monitoring: Secondary | ICD-10-CM

## 2013-08-12 DIAGNOSIS — I4892 Unspecified atrial flutter: Secondary | ICD-10-CM

## 2013-08-12 LAB — POCT INR: INR: 2.7

## 2013-08-31 ENCOUNTER — Telehealth: Payer: Self-pay | Admitting: *Deleted

## 2013-08-31 NOTE — Telephone Encounter (Signed)
Pt states that his HR 148 today and is causing his feet to swell and hurt bad. Pt states it has been happening alot since march. States on some medication for it but has been worse in the last couple weeks then normal.

## 2013-08-31 NOTE — Telephone Encounter (Signed)
Pt has some swelling in his legs, states he has some "hotspots" that hurt. Pt says he has been taking Lasix 40 mg daily and has went down a bit. Pt states he started taking his pulse since he has had the swelling and it has been in the 130 -140 all weekend. Please advise

## 2013-09-01 NOTE — Telephone Encounter (Signed)
He has a history of atrial fibrillation and atrial flutter requiring cardioversion in the past. If his heart rate is up in the 140s, he is likely back in one of these arrhythmias. Probably should be seen in the ER at [facility] as I expect he will need further efforts at heart rate control and probably another cardioversion.

## 2013-09-01 NOTE — Telephone Encounter (Signed)
Left message to call back  

## 2013-09-02 ENCOUNTER — Other Ambulatory Visit: Payer: Self-pay | Admitting: *Deleted

## 2013-09-02 ENCOUNTER — Ambulatory Visit (INDEPENDENT_AMBULATORY_CARE_PROVIDER_SITE_OTHER): Payer: BC Managed Care – PPO | Admitting: *Deleted

## 2013-09-02 ENCOUNTER — Ambulatory Visit (INDEPENDENT_AMBULATORY_CARE_PROVIDER_SITE_OTHER): Payer: BC Managed Care – PPO | Admitting: Physician Assistant

## 2013-09-02 VITALS — BP 156/92 | HR 49 | Ht 70.0 in | Wt 240.0 lb

## 2013-09-02 DIAGNOSIS — Z5181 Encounter for therapeutic drug level monitoring: Secondary | ICD-10-CM

## 2013-09-02 DIAGNOSIS — I4892 Unspecified atrial flutter: Secondary | ICD-10-CM

## 2013-09-02 DIAGNOSIS — Z7901 Long term (current) use of anticoagulants: Secondary | ICD-10-CM

## 2013-09-02 DIAGNOSIS — I4891 Unspecified atrial fibrillation: Secondary | ICD-10-CM

## 2013-09-02 DIAGNOSIS — I471 Supraventricular tachycardia: Secondary | ICD-10-CM

## 2013-09-02 DIAGNOSIS — I5033 Acute on chronic diastolic (congestive) heart failure: Secondary | ICD-10-CM

## 2013-09-02 DIAGNOSIS — I10 Essential (primary) hypertension: Secondary | ICD-10-CM

## 2013-09-02 LAB — POCT INR: INR: 3.6

## 2013-09-02 MED ORDER — METOPROLOL TARTRATE 50 MG PO TABS: 50.0000 mg | ORAL_TABLET | Freq: Two times a day (BID) | ORAL | Status: DC

## 2013-09-02 NOTE — Patient Instructions (Signed)
Your physician recommends that you schedule a follow-up appointment in: 1 week with Dr. [name]   Your physician has recommended you make the following change in your medication:   INCREASE METOPROLOL TO 50 MG TWICE A DAY  Thank you for choosing [facility]!!

## 2013-09-02 NOTE — Progress Notes (Signed)
HPI: This is a pleasant 63 year old male patient [name] [name] who has history of coronary artery disease status post CABG in 2004 with cath in 2008 revealing all grafts patent. He most recently had TEE cardioversion on 06/01/13 for atrial fibrillation. He has been on Coumadin and maintained sinus rhythm until this weekend. He noticed more swelling and increased shortness of breath. He took his pulse and it was 140 beats per minute. He has been feeling poorly since. He is back in atrial fibrillation at 107 beats per minute. He also has history of diastolic heart failure but was able to wean himself off his Lasix when he was in normal sinus rhythm. He is now taking Lasix daily again because of the edema. He denies any change in medicine or activity. 2-D echo in March showed preserved LV function with no wall marsh and abnormality and diastolic dysfunction. Patient denies chest pain, dizziness or presyncope.  Allergies  -- Shellfish Allergy -- Anaphylaxis  -- Fish Oil -- Nausea And Vomiting  -- Iohexol    --   Desc: THROAT SWELLING-REQUIRES 13 HR PREP  Current Outpatient Prescriptions on File Prior to Visit: atorvastatin (LIPITOR) 20 MG tablet, Take 1 tablet (20 mg total) by mouth daily at 6 PM., Disp: 30 tablet, Rfl: 1 diltiazem (CARDIZEM LA) 120 MG 24 hr tablet, Take 1 tablet (120 mg total) by mouth daily., Disp: 90 tablet, Rfl: 3 furosemide (LASIX) 40 MG tablet, Take 1 tablet (40 mg total) by mouth daily. For fluid retention., Disp: 90 tablet, Rfl: 3 levothyroxine (SYNTHROID, LEVOTHROID) 75 MCG tablet, Take 1 tablet (75 mcg total) by mouth daily before breakfast., Disp: 30 tablet, Rfl: 1 lisinopril (PRINIVIL,ZESTRIL) 40 MG tablet, Take 40 mg by mouth daily., Disp: , Rfl:  metFORMIN (GLUCOPHAGE) 500 MG tablet, Take 500 mg by mouth 2 (two) times daily.  , Disp: , Rfl:  nitroGLYCERIN (NITROSTAT) 0.4 MG SL tablet, Place 1 tablet (0.4 mg total) under the tongue every 5 (five) minutes as needed. For chest  pains., Disp: 25 tablet, Rfl: 3 potassium chloride SA (K-DUR,KLOR-CON) 20 MEQ tablet, Take 1 tablet (20 mEq total) by mouth daily., Disp: 90 tablet, Rfl: 3 warfarin (COUMADIN) 5 MG tablet, Take 0.5-1 tablets (2.5-5 mg total) by mouth See admin instructions. 2.5 mg ( 0.5 tab ) Tuesday Wednesday Friday Saturday Sunday  and 5 mg on Monday Thursday, Disp: 30 tablet, Rfl: 0 warfarin (COUMADIN) 5 MG tablet, TAKE 1 TABLET BY MOUTH AS DIRECTED, Disp: 90 tablet, Rfl: 0 [DISCONTINUED] enoxaparin (LOVENOX) 100 MG/ML injection, Inject 1 mL (100 mg total) into the skin every 12 (twelve) hours., Disp: 10 mL, Rfl: 0 [DISCONTINUED] glimepiride (AMARYL) 4 MG tablet, Take 1 tablet (4 mg total) by mouth daily with breakfast., Disp: 30 tablet, Rfl: 1  No current facility-administered medications on file prior to visit.   Past Medical History:   Coronary atherosclerosis of native coronary ar*                Comment:Multivessel, LVEF 65%   Superficial thrombophlebitis                                 Varicose veins                                               Diabetes mellitus,  type 2                                    Hyperlipidemia                                               Chronic back pain                                            Essential hypertension, benign                               Atrial fibrillation                             2011           Comment:Postoperative lap chole   Atrial flutter                                  2011           Comment:Postoperative lap chole  Past Surgical History:   CORONARY ARTERY BYPASS GRAFT                     2004           Comment:LIMA to LAD, SVG to D1 and OM1, SVG to D3, and               SVG to PDA   CHOLECYSTECTOMY                                               CARDIAC CATHETERIZATION                          2008           Comment:L Main 40%, LAD 100%, CFX (dominant) 50%, OM2               80%, RCA OK, All grafts patent, SVG-PDA with                50% stenosis, EF nl   TEE WITHOUT CARDIOVERSION                       N/A 06/01/2013      Comment:Procedure: TRANSESOPHAGEAL ECHOCARDIOGRAM               (TEE);  Surgeon: Chrystie Nose, MD;                [facility] ENDOSCOPY;  Service:               Cardiovascular;  Laterality: N/A;   CARDIOVERSION                                   N/A 06/01/2013  Comment:Procedure: CARDIOVERSION;  Surgeon: Chrystie NoseKenneth C.               Hilty, MD;  Location: [facility] ENDOSCOPY;  Service:               Cardiovascular;  Laterality: N/A;  9:59               anesthesiologist present, giving Lido 40 mg,IV               followed by Propofol 110 mg,IV 10:02 synced               cardioversion @ 150 joules, SR with PAC's  No family history on file.   Social History   Marital Status: Single              Spouse Name:                      Years of Education:                 Number of children:             Occupational History Occupation          Associate [employer]            Comment              Retired since 2008                        Social History Main Topics   Smoking Status: Never Smoker                     Smokeless Status: Never Used                       Alcohol Use: Yes           0.0 oz/week      3-4 Shots of liquor, 2-3 Cans of beer per week      Comment: socially   Drug Use: No             Sexual Activity: Not on file        Other Topics            Concern   None on file  Social History Narrative   Lives alone.     ROS: Frequently has gout for which he takes prednisone. Currently not taking prednisone. Otherwise see history of present illness   PHYSICAL EXAM: Well-nournished, in no acute distress. Neck: No JVD, HJR, Bruit, or thyroid enlargement  Lungs: Decreased breath sounds but No tachypnea, clear without wheezing, rales, or rhonchi  Cardiovascular: Irregular irregular, PMI not displaced, 2/6 systolic murmur at the left sternal border, no gallops, bruit, thrill, or heave.  Abdomen: BS  normal. Soft without organomegaly, masses, lesions or tenderness.  Extremities: +1 edema bilaterally without cyanosis, clubbing. Good distal pulses bilateral  SKin: Warm, no lesions or rashes   Musculoskeletal: No deformities  Neuro: no focal signs  BP 156/92  Pulse 49  Ht 5\' 10"  (1.778 m)  Wt 240 lb (108.863 kg)  BMI 34.44 kg/m2  SpO2 99%   EKG: Atrial fibrillation at 107 beats per minute   INR 3.6 today

## 2013-09-02 NOTE — Telephone Encounter (Signed)
Pt states he rather have his heart checked in the office. Made appt for today with Herma CarsonMichelle Lenze, PA. Called pt back to let him know that we might send him to the ER from the office if his heart rate is high or with Arrhythmia. Pt states he might make it to the office, he will call if he can not.

## 2013-09-02 NOTE — Assessment & Plan Note (Signed)
Blood pressure is elevated today. It should come down on the higher dose metoprolol

## 2013-09-02 NOTE — Assessment & Plan Note (Signed)
INR 3.6 today.

## 2013-09-02 NOTE — Assessment & Plan Note (Signed)
Patient came off his diuretic when he was in normal sinus rhythm but has developed edema and now that he is back in atrial fibrillation. Continue daily Lasix and potassium

## 2013-09-02 NOTE — Assessment & Plan Note (Addendum)
Patient is back in atrial fibrillation with rapid ventricular response. I discussed this patient in detail with Dr. Wyline Moodbranch. He recommends increasing his metoprolol to 50 mg twice a day. We will discuss with Dr. [name] who will make a decision on further cardioversion. The patient has been adequately anticoagulated since March. Appointment made with Dr. [name] in the [place] office Friday.

## 2013-09-03 ENCOUNTER — Telehealth: Payer: Self-pay | Admitting: *Deleted

## 2013-09-03 ENCOUNTER — Other Ambulatory Visit: Payer: Self-pay | Admitting: Cardiology

## 2013-09-03 DIAGNOSIS — I4891 Unspecified atrial fibrillation: Secondary | ICD-10-CM

## 2013-09-03 NOTE — Telephone Encounter (Signed)
Pt confirmed he had a ride and would be at short stay at [facility] 0810 - 0830 for cardioversion with Dr. Wyline MoodBranch

## 2013-09-04 ENCOUNTER — Encounter (HOSPITAL_COMMUNITY)
Admission: RE | Disposition: A | Discharge status: Home / Self Care | Payer: Self-pay | Source: Ambulatory Visit | Attending: Cardiology

## 2013-09-04 ENCOUNTER — Ambulatory Visit (HOSPITAL_COMMUNITY)
Admission: RE | Admit: 2013-09-04 | Discharge: 2013-09-04 | Disposition: A | Discharge status: Home / Self Care | Payer: BC Managed Care – PPO | Source: Ambulatory Visit | Attending: Cardiology | Admitting: Cardiology

## 2013-09-04 ENCOUNTER — Encounter (HOSPITAL_COMMUNITY): Payer: Self-pay | Admitting: *Deleted

## 2013-09-04 ENCOUNTER — Encounter (HOSPITAL_COMMUNITY): Admission: RE | Payer: Self-pay | Source: Ambulatory Visit

## 2013-09-04 ENCOUNTER — Ambulatory Visit (HOSPITAL_COMMUNITY): Payer: BC Managed Care – PPO | Admitting: Anesthesiology

## 2013-09-04 ENCOUNTER — Encounter (HOSPITAL_COMMUNITY): Payer: BC Managed Care – PPO | Admitting: Anesthesiology

## 2013-09-04 ENCOUNTER — Ambulatory Visit (HOSPITAL_COMMUNITY): Admission: RE | Admit: 2013-09-04 | Payer: BC Managed Care – PPO | Source: Ambulatory Visit | Admitting: Cardiology

## 2013-09-04 ENCOUNTER — Ambulatory Visit: Payer: BC Managed Care – PPO | Admitting: Cardiology

## 2013-09-04 DIAGNOSIS — I2510 Atherosclerotic heart disease of native coronary artery without angina pectoris: Secondary | ICD-10-CM | POA: Insufficient documentation

## 2013-09-04 DIAGNOSIS — I4891 Unspecified atrial fibrillation: Secondary | ICD-10-CM

## 2013-09-04 DIAGNOSIS — Z8672 Personal history of thrombophlebitis: Secondary | ICD-10-CM | POA: Insufficient documentation

## 2013-09-04 DIAGNOSIS — Z7901 Long term (current) use of anticoagulants: Secondary | ICD-10-CM | POA: Insufficient documentation

## 2013-09-04 DIAGNOSIS — I5033 Acute on chronic diastolic (congestive) heart failure: Secondary | ICD-10-CM | POA: Insufficient documentation

## 2013-09-04 DIAGNOSIS — G8929 Other chronic pain: Secondary | ICD-10-CM | POA: Insufficient documentation

## 2013-09-04 DIAGNOSIS — M549 Dorsalgia, unspecified: Secondary | ICD-10-CM | POA: Insufficient documentation

## 2013-09-04 DIAGNOSIS — I10 Essential (primary) hypertension: Secondary | ICD-10-CM | POA: Insufficient documentation

## 2013-09-04 DIAGNOSIS — E119 Type 2 diabetes mellitus without complications: Secondary | ICD-10-CM | POA: Insufficient documentation

## 2013-09-04 DIAGNOSIS — E785 Hyperlipidemia, unspecified: Secondary | ICD-10-CM | POA: Insufficient documentation

## 2013-09-04 HISTORY — PX: CARDIOVERSION: SHX1299

## 2013-09-04 LAB — BASIC METABOLIC PANEL
BUN: 9 mg/dL (ref 6–23)
CO2: 24 meq/L (ref 19–32)
Calcium: 8.9 mg/dL (ref 8.4–10.5)
Chloride: 100 mEq/L (ref 96–112)
Creatinine, Ser: 1.61 mg/dL — ABNORMAL HIGH (ref 0.50–1.35)
GFR calc Af Amer: 51 mL/min — ABNORMAL LOW (ref >90)
GFR calc non Af Amer: 44 mL/min — ABNORMAL LOW (ref >90)
GLUCOSE: 150 mg/dL — AB (ref 70–99)
POTASSIUM: 4.1 meq/L (ref 3.7–5.3)
Sodium: 140 mEq/L (ref 137–147)

## 2013-09-04 LAB — PROTIME-INR
INR: 2.67 — AB (ref 0.00–1.49)
Prothrombin Time: 27.5 seconds — ABNORMAL HIGH (ref 11.6–15.2)

## 2013-09-04 SURGERY — CARDIOVERSION
Anesthesia: Monitor Anesthesia Care

## 2013-09-04 MED ORDER — PROPOFOL 10 MG/ML IV BOLUS
INTRAVENOUS | Status: AC
  Filled 2013-09-04: qty 20

## 2013-09-04 MED ORDER — MIDAZOLAM HCL 2 MG/2ML IJ SOLN
INTRAMUSCULAR | Status: AC
  Filled 2013-09-04: qty 2

## 2013-09-04 MED ORDER — ONDANSETRON HCL 4 MG/2ML IJ SOLN
4.0000 mg | Freq: Once | INTRAMUSCULAR | Status: DC
  Filled 2013-09-04: qty 2

## 2013-09-04 MED ORDER — MIDAZOLAM HCL 2 MG/2ML IJ SOLN: 1.0000 mg | INTRAMUSCULAR | Status: DC | PRN

## 2013-09-04 MED ORDER — MIDAZOLAM HCL 5 MG/5ML IJ SOLN
INTRAMUSCULAR | Status: DC | PRN
  Administered 2013-09-04: 2 mg via INTRAVENOUS

## 2013-09-04 MED ORDER — PROPOFOL INFUSION 10 MG/ML OPTIME
INTRAVENOUS | Status: DC | PRN
  Administered 2013-09-04: 75 ug/kg/min via INTRAVENOUS

## 2013-09-04 MED ORDER — ONDANSETRON HCL 4 MG/2ML IJ SOLN
INTRAMUSCULAR | Status: DC | PRN
  Administered 2013-09-04: 4 mg via INTRAVENOUS

## 2013-09-04 MED ORDER — FENTANYL CITRATE 0.05 MG/ML IJ SOLN
INTRAMUSCULAR | Status: AC
  Filled 2013-09-04: qty 2

## 2013-09-04 MED ORDER — LACTATED RINGERS IV SOLN: INTRAVENOUS | Status: DC

## 2013-09-04 MED ORDER — LIDOCAINE HCL (PF) 1 % IJ SOLN
INTRAMUSCULAR | Status: AC
  Filled 2013-09-04: qty 5

## 2013-09-04 MED ORDER — LACTATED RINGERS IV SOLN
INTRAVENOUS | Status: DC | PRN
  Administered 2013-09-04: 09:00:00 via INTRAVENOUS

## 2013-09-04 MED ORDER — FENTANYL CITRATE 0.05 MG/ML IJ SOLN
INTRAMUSCULAR | Status: DC | PRN
  Administered 2013-09-04 (×2): 50 ug via INTRAVENOUS

## 2013-09-04 MED ORDER — FENTANYL CITRATE 0.05 MG/ML IJ SOLN
25.0000 ug | INTRAMUSCULAR | Status: AC
  Filled 2013-09-04: qty 2

## 2013-09-04 NOTE — Discharge Instructions (Signed)
Electrical Cardioversion, Care After °Refer to this sheet in the next few weeks. These instructions provide you with information on caring for yourself after your procedure. Your health care provider may also give you more specific instructions. Your treatment has been planned according to current medical practices, but problems sometimes occur. Call your health care provider if you have any problems or questions after your procedure. °WHAT TO EXPECT AFTER THE PROCEDURE °After your procedure, it is typical to have the following sensations: °· Some redness on the skin where the shocks were delivered. If this is tender, a sunburn lotion or hydrocortisone cream may help. °· Possible return of an abnormal heart rhythm within hours or days after the procedure. °HOME CARE INSTRUCTIONS °· Only take medicine as directed by your health care provider. Be sure you understand how and when to take your medicine. °· Learn how to feel your pulse and check it often. °· Limit your activity for 48 hours after the procedure or as directed. °· Avoid or minimize caffeine and other stimulants as directed. °SEEK MEDICAL CARE IF: °· You feel like your heart is beating too fast or your pulse is not regular. °· You have any questions about your medicines. °· You have bleeding that will not stop. °SEEK IMMEDIATE MEDICAL CARE IF: °· You are dizzy or feel faint. °· It is hard to breathe or you feel short of breath. °· There is a change in discomfort in your chest. °· Your speech is slurred or you have trouble moving an arm or leg on one side of your body. °· You get a serious muscle cramp that does not go away. °· Your fingers or toes turn cold or blue. °MAKE SURE YOU:  °· Understand these instructions.   °· Will watch your condition.   °· Will get help right away if you are not doing well or get worse. °Document Released: 12/24/2012 Document Reviewed: 12/24/2012 °[employer]® Patient Information ©2015 [employer]. This information is not  intended to replace advice given to you by your health care provider. Make sure you discuss any questions you have with your health care provider. ° °

## 2013-09-04 NOTE — Addendum Note (Signed)
Addendum created 09/04/13 1017 by Earleen NewportAmy A Adams, CRNA   Modules edited: Anesthesia Medication Administration

## 2013-09-04 NOTE — Procedures (Signed)
Direct current cardioversion  Patient was brought to the PACU after appropriate consent was obtained. Anesthesia managed sedation and assisted with monitoring, please refer to the there note. Defib pads were placed in the anterior and posterior positions. 12 lead EKG prior to procedure confirmed afib. INRs have been therapeutic since March 2015 on coumadin. The patient was successfully converted with synchronized 200j shock x 1. Follow up 12 lead EKG showed NSR with PACs, rate 70. The patient tolerated the procedure well without any complications.   Dina RichJonathan Branch MD

## 2013-09-04 NOTE — H&P (Signed)
Procedure H&P - Most recent clinic note 09/02/13 reviewed and as documented below. Plan for DCCV due to symptomatic afib. Patient's INRs have been therapeutic since 05/2013 on coumadin.   Dina Rich MD  HPI: This is a pleasant 63 year old male patient Dr. Diona Browner who has history of coronary artery disease status post CABG in 2004 with cath in 2008 revealing all grafts patent. He most recently had TEE cardioversion on 06/01/13 for atrial fibrillation. He has been on Coumadin and maintained sinus rhythm until this weekend. He noticed more swelling and increased shortness of breath. He took his pulse and it was 140 beats per minute. He has been feeling poorly since. He is back in atrial fibrillation at 107 beats per minute. He also has history of diastolic heart failure but was able to wean himself off his Lasix when he was in normal sinus rhythm. He is now taking Lasix daily again because of the edema. He denies any change in medicine or activity. 2-D echo in March showed preserved LV function with no Deven Audi marsh and abnormality and diastolic dysfunction. Patient denies chest pain, dizziness or presyncope.   Allergies  -- Shellfish Allergy -- Anaphylaxis  -- Fish Oil -- Nausea And Vomiting  -- Iohexol     --   Desc: THROAT SWELLING-REQUIRES 13 HR PREP   Current Outpatient Prescriptions on File Prior to Visit: atorvastatin (LIPITOR) 20 MG tablet, Take 1 tablet (20 mg total) by mouth daily at 6 PM., Disp: 30 tablet, Rfl: 1 diltiazem (CARDIZEM LA) 120 MG 24 hr tablet, Take 1 tablet (120 mg total) by mouth daily., Disp: 90 tablet, Rfl: 3 furosemide (LASIX) 40 MG tablet, Take 1 tablet (40 mg total) by mouth daily. For fluid retention., Disp: 90 tablet, Rfl: 3 levothyroxine (SYNTHROID, LEVOTHROID) 75 MCG tablet, Take 1 tablet (75 mcg total) by mouth daily before breakfast., Disp: 30 tablet, Rfl: 1 lisinopril (PRINIVIL,ZESTRIL) 40 MG tablet, Take 40 mg by mouth daily., Disp: , Rfl:   metFORMIN  (GLUCOPHAGE) 500 MG tablet, Take 500 mg by mouth 2 (two) times daily.  , Disp: , Rfl:   nitroGLYCERIN (NITROSTAT) 0.4 MG SL tablet, Place 1 tablet (0.4 mg total) under the tongue every 5 (five) minutes as needed. For chest pains., Disp: 25 tablet, Rfl: 3 potassium chloride SA (K-DUR,KLOR-CON) 20 MEQ tablet, Take 1 tablet (20 mEq total) by mouth daily., Disp: 90 tablet, Rfl: 3 warfarin (COUMADIN) 5 MG tablet, Take 0.5-1 tablets (2.5-5 mg total) by mouth See admin instructions. 2.5 mg ( 0.5 tab ) Tuesday Wednesday Friday Saturday Sunday  and 5 mg on Monday Thursday, Disp: 30 tablet, Rfl: 0 warfarin (COUMADIN) 5 MG tablet, TAKE 1 TABLET BY MOUTH AS DIRECTED, Disp: 90 tablet, Rfl: 0 [DISCONTINUED] enoxaparin (LOVENOX) 100 MG/ML injection, Inject 1 mL (100 mg total) into the skin every 12 (twelve) hours., Disp: 10 mL, Rfl: 0 [DISCONTINUED] glimepiride (AMARYL) 4 MG tablet, Take 1 tablet (4 mg total) by mouth daily with breakfast., Disp: 30 tablet, Rfl: 1   No current facility-administered medications on file prior to visit.     Past Medical History:   Coronary atherosclerosis of native coronary ar*                 Comment:Multivessel, LVEF 65%   Superficial thrombophlebitis                                  Varicose veins  Diabetes mellitus, type 2                                     Hyperlipidemia                                                Chronic back pain                                             Essential hypertension, benign                                Atrial fibrillation                             2011            Comment:Postoperative lap chole   Atrial flutter                                  2011            Comment:Postoperative lap chole   Past Surgical History:   CORONARY ARTERY BYPASS GRAFT                     2004            Comment:LIMA to LAD, SVG to D1 and OM1, SVG to D3, and                SVG to PDA   CHOLECYSTECTOMY                                                 CARDIAC CATHETERIZATION                          2008            Comment:L Main 40%, LAD 100%, CFX (dominant) 50%, OM2                80%, RCA OK, All grafts patent, SVG-PDA with                50% stenosis, EF nl   TEE WITHOUT CARDIOVERSION                       N/A 06/01/2013       Comment:Procedure: TRANSESOPHAGEAL ECHOCARDIOGRAM                (TEE);  Surgeon: Chrystie NoseKenneth C. Hilty, MD;                 [facility] END[facility];  Service:                Cardiovascular;  Laterality: N/A;   CARDIOVERSION  N/A 06/01/2013       Comment:Procedure: CARDIOVERSION;  Surgeon: Chrystie Nose, MD;  Location: [facility] ENDOSCOPY;  Service:                Cardiovascular;  Laterality: N/A;  9:59                anesthesiologist present, giving Lido 40 mg,IV                followed by Propofol 110 mg,IV 10:02 synced                cardioversion @ 150 joules, SR with PAC's   No family history on file.     Social History   Marital Status: Single              Spouse Name:                       Years of Education:                 Number of children:              Occupational History Occupation          Associate Professor            Comment               Retired since 2008                         Social History Main Topics   Smoking Status: Never Smoker                      Smokeless Status: Never Used                        Alcohol Use: Yes           0.0 oz/week      3-4 Shots of liquor, 2-3 Cans of beer per week      Comment: socially   Drug Use: No              Sexual Activity: Not on file         Other Topics            Concern   None on file   Social History Narrative   Lives alone.        ROS: Frequently has gout for which he takes prednisone. Currently not taking prednisone. Otherwise see history of present illness     PHYSICAL EXAM: Well-nournished, in no acute distress. Neck: No JVD, HJR, Bruit,  or thyroid enlargement   Lungs: Decreased breath sounds but No tachypnea, clear without wheezing, rales, or rhonchi   Cardiovascular: Irregular irregular, PMI not displaced, 2/6 systolic murmur at the left sternal border, no gallops, bruit, thrill, or heave.   Abdomen: BS normal. Soft without organomegaly, masses, lesions or tenderness.   Extremities: +1 edema bilaterally without cyanosis, clubbing. Good distal pulses bilateral   SKin: Warm, no lesions or rashes    Musculoskeletal: No deformities   Neuro: no focal signs   BP 156/92  Pulse 49  Ht 5\' 10"  (1.778 m)  Wt 240 lb (108.863 kg)  BMI 34.44 kg/m2  SpO2 99%     EKG: Atrial fibrillation at 107 beats per minute  INR 3.6 today         Atrial fibrillation - Dyann KiefMichele M Lenze, PA-C at 09/02/2013  1:47 PM    Status: Edited Related Problem: Atrial fibrillation    Patient is back in atrial fibrillation with rapid ventricular response. I discussed this patient in detail with Dr. Wyline Moodbranch. He recommends increasing his metoprolol to 50 mg twice a day. We will discuss with Dr. [name] who will make a decision on further cardioversion. The patient has been adequately anticoagulated since March. Appointment made with Dr. [name] in the [place] office Friday.    Revision History       Date/Time User Action    > 09/02/2013  1:49 PM Dyann KiefMichele M Lenze, PA-C Edit      09/02/2013  1:47 PM Dyann KiefMichele M Lenze, PA-C Create         Acute on chronic diastolic heart failure - Dyann KiefMichele M Lenze, PA-C at 09/02/2013  1:47 PM    Status: Written Related Problem: Acute on chronic diastolic heart failure    Patient came off his diuretic when he was in normal sinus rhythm but has developed edema and now that he is back in atrial fibrillation. Continue daily Lasix and potassium         Essential hypertension, benign - Dyann KiefMichele M Lenze, PA-C at 09/02/2013  1:48 PM    Status: Written Related Problem: Essential hypertension, benign    Blood pressure is  elevated today. It should come down on the higher dose metoprolol         Encounter for long-term (current) use of anticoagulants - Dyann KiefMichele M Lenze, PA-C at 09/02/2013  1:48 PM    Status: Written Related Problem: Encounter for long-term (current) use of anticoagulants    INR 3.6 today

## 2013-09-04 NOTE — Transfer of Care (Signed)
Immediate Anesthesia Transfer of Care Note  Patient: [name] PresserLarry W [name]  Procedure(s) Performed: Procedure(s) with comments: CARDIOVERSION (N/A) - Moderate sedation with assistance from anethesia  Patient Location: PACU  Anesthesia Type:MAC  Level of Consciousness: awake, alert , oriented and patient cooperative  Airway & Oxygen Therapy: Patient Spontanous Breathing and Patient connected to nasal cannula oxygen  Post-op Assessment: Report given to PACU RN and Post -op Vital signs reviewed and stable  Post vital signs: Reviewed and stable  Complications: No apparent anesthesia complications

## 2013-09-04 NOTE — Addendum Note (Signed)
Addendum created 09/04/13 1018 by Earleen NewportAmy A Adams, CRNA   Modules edited: Anesthesia Medication Administration

## 2013-09-04 NOTE — Anesthesia Postprocedure Evaluation (Signed)
  Anesthesia Post-op Note  Patient: [name] [name]  Procedure(s) Performed: Procedure(s) with comments: CARDIOVERSION (N/A) - Moderate sedation with assistance from anethesia  Patient Location: PACU  Anesthesia Type:MAC  Level of Consciousness: awake, alert , oriented and patient cooperative  Airway and Oxygen Therapy: Patient Spontanous Breathing and Patient connected to nasal cannula oxygen  Post-op Pain: none  Post-op Assessment: Post-op Vital signs reviewed, Patient's Cardiovascular Status Stable, Respiratory Function Stable, Patent Airway, No signs of Nausea or vomiting and Pain level controlled  Post-op Vital Signs: Reviewed and stable  Last Vitals:  Filed Vitals:   09/04/13 0915  BP: 131/74  Temp:   Resp: 30    Complications: No apparent anesthesia complications

## 2013-09-04 NOTE — Anesthesia Procedure Notes (Signed)
Procedure Name: MAC Date/Time: 09/04/2013 9:33 AM Performed by: Pernell DupreADAMS, AMY A Pre-anesthesia Checklist: Patient identified, Emergency Drugs available, Suction available, Patient being monitored and Timeout performed Oxygen Delivery Method: Simple face mask

## 2013-09-04 NOTE — Anesthesia Preprocedure Evaluation (Signed)
Anesthesia Evaluation  Patient identified by MRN, date of birth, ID band Patient awake    Reviewed: Allergy & Precautions, H&P , NPO status , Patient's Chart, lab work & pertinent test results  History of Anesthesia Complications Negative for: history of anesthetic complications  Airway Mallampati: II      Dental  (+) Teeth Intact   Pulmonary neg pulmonary ROS,  breath sounds clear to auscultation        Cardiovascular hypertension, Pt. on medications + CAD and + CABG + dysrhythmias Atrial Fibrillation Rhythm:Irregular Rate:Normal     Neuro/Psych negative neurological ROS     GI/Hepatic negative GI ROS, Neg liver ROS,   Endo/Other  diabetes, Type 2, Oral Hypoglycemic Agents  Renal/GU negative Renal ROS     Musculoskeletal   Abdominal   Peds  Hematology   Anesthesia Other Findings   Reproductive/Obstetrics                           Anesthesia Physical Anesthesia Plan  ASA: III  Anesthesia Plan: MAC   Post-op Pain Management:    Induction: Intravenous  Airway Management Planned: Simple Face Mask  Additional Equipment:   Intra-op Plan:   Post-operative Plan:   Informed Consent: I have reviewed the patients History and Physical, chart, labs and discussed the procedure including the risks, benefits and alternatives for the proposed anesthesia with the patient or authorized representative who has indicated his/her understanding and acceptance.     Plan Discussed with:   Anesthesia Plan Comments:         Anesthesia Quick Evaluation

## 2013-09-04 NOTE — Addendum Note (Signed)
Addendum created 09/04/13 1026 by Earleen NewportAmy A Adams, CRNA   Modules edited: Charges VN

## 2013-09-07 NOTE — Addendum Note (Signed)
Addendum created 09/07/13 1342 by Earleen NewportAmy A Adams, CRNA   Modules edited: Charges VN

## 2013-09-08 ENCOUNTER — Encounter (HOSPITAL_COMMUNITY): Payer: Self-pay | Admitting: Cardiology

## 2013-09-16 ENCOUNTER — Other Ambulatory Visit: Payer: Self-pay | Admitting: Cardiology

## 2013-09-24 ENCOUNTER — Encounter: Payer: Self-pay | Admitting: Cardiology

## 2013-09-24 ENCOUNTER — Ambulatory Visit (INDEPENDENT_AMBULATORY_CARE_PROVIDER_SITE_OTHER): Payer: BC Managed Care – PPO | Admitting: *Deleted

## 2013-09-24 ENCOUNTER — Ambulatory Visit (INDEPENDENT_AMBULATORY_CARE_PROVIDER_SITE_OTHER): Payer: BC Managed Care – PPO | Admitting: Cardiology

## 2013-09-24 VITALS — BP 147/77 | HR 64 | Ht 70.0 in | Wt 243.0 lb

## 2013-09-24 DIAGNOSIS — I4892 Unspecified atrial flutter: Secondary | ICD-10-CM

## 2013-09-24 DIAGNOSIS — Z7901 Long term (current) use of anticoagulants: Secondary | ICD-10-CM

## 2013-09-24 DIAGNOSIS — Z5181 Encounter for therapeutic drug level monitoring: Secondary | ICD-10-CM

## 2013-09-24 DIAGNOSIS — I4891 Unspecified atrial fibrillation: Secondary | ICD-10-CM

## 2013-09-24 DIAGNOSIS — I480 Paroxysmal atrial fibrillation: Secondary | ICD-10-CM

## 2013-09-24 DIAGNOSIS — I2510 Atherosclerotic heart disease of native coronary artery without angina pectoris: Secondary | ICD-10-CM

## 2013-09-24 DIAGNOSIS — I10 Essential (primary) hypertension: Secondary | ICD-10-CM

## 2013-09-24 LAB — POCT INR: INR: 3

## 2013-09-24 MED ORDER — LISINOPRIL 40 MG PO TABS: 40.0000 mg | ORAL_TABLET | Freq: Every day | ORAL | Status: DC

## 2013-09-24 MED ORDER — METOPROLOL TARTRATE 25 MG PO TABS: 25.0000 mg | ORAL_TABLET | Freq: Two times a day (BID) | ORAL | Status: DC

## 2013-09-24 NOTE — Progress Notes (Signed)
Clinical Summary [name] [name] is a 63 y.o.male last seen in the office in June by Ms. Geni Bers PA-C. He was evaluated at that time due to shortness of breath and tachycardia, heart rate in the 140s at home. He was noted to be back in atrial fibrillation. Beta blocker dose was increased, and he was scheduled for elective cardioversion, having been therapeutic on Coumadin.  Patient underwent elective cardioversion with Dr. Wyline Mood in June 19. He required one synchronized biphasic shock at 200 J for restoration of sinus rhythm. He states he feels much better since that time, heart rate has been controlled. Actually, he never did increase his Lopressor to 50 mg twice daily either.  Today we reviewed his medications, discussed the possibility of adding an antiarrhythmic such as amiodarone. For now he wanted to hold off. He has had 2 cardioversions this year so far.   Allergies  Allergen Reactions  . Shellfish Allergy Anaphylaxis  . Fish Oil Nausea And Vomiting  . Iohexol      Desc: THROAT SWELLING-REQUIRES 13 HR PREP     Current Outpatient Prescriptions  Medication Sig Dispense Refill  . atorvastatin (LIPITOR) 20 MG tablet Take 1 tablet (20 mg total) by mouth daily at 6 PM.  30 tablet  1  . diltiazem (CARDIZEM LA) 120 MG 24 hr tablet Take 1 tablet (120 mg total) by mouth daily.  90 tablet  3  . furosemide (LASIX) 40 MG tablet Take 1 tablet (40 mg total) by mouth daily. For fluid retention.  90 tablet  3  . levothyroxine (SYNTHROID, LEVOTHROID) 75 MCG tablet Take 1 tablet (75 mcg total) by mouth daily before breakfast.  30 tablet  1  . lisinopril (PRINIVIL,ZESTRIL) 40 MG tablet Take 1 tablet (40 mg total) by mouth daily.  90 tablet  3  . metFORMIN (GLUCOPHAGE) 500 MG tablet Take 500 mg by mouth 2 (two) times daily.        . metoprolol (LOPRESSOR) 25 MG tablet Take 1 tablet (25 mg total) by mouth 2 (two) times daily.  180 tablet  3  . nitroGLYCERIN (NITROSTAT) 0.4 MG SL tablet Place 1 tablet (0.4  mg total) under the tongue every 5 (five) minutes as needed. For chest pains.  25 tablet  3  . potassium chloride SA (K-DUR,KLOR-CON) 20 MEQ tablet Take 1 tablet (20 mEq total) by mouth daily.  90 tablet  3  . predniSONE (DELTASONE) 20 MG tablet Take 20 mg by mouth as needed.      . warfarin (COUMADIN) 5 MG tablet Take 0.5-1 tablets (2.5-5 mg total) by mouth See admin instructions. 2.5 mg ( 0.5 tab ) Tuesday Wednesday Friday Saturday Sunday  and 5 mg on Monday Thursday  30 tablet  0  . warfarin (COUMADIN) 5 MG tablet TAKE 1 TABLET BY MOUTH AS DIRECTED  90 tablet  0  . [DISCONTINUED] enoxaparin (LOVENOX) 100 MG/ML injection Inject 1 mL (100 mg total) into the skin every 12 (twelve) hours.  10 mL  0  . [DISCONTINUED] glimepiride (AMARYL) 4 MG tablet Take 1 tablet (4 mg total) by mouth daily with breakfast.  30 tablet  1   No current facility-administered medications for this visit.    Past Medical History  Diagnosis Date  . Coronary atherosclerosis of native coronary artery     Multivessel, LVEF 65%  . Superficial thrombophlebitis   . Varicose veins   . Diabetes mellitus, type 2   . Hyperlipidemia   . Chronic back pain   .  Essential hypertension, benign   . Atrial fibrillation 2011    Postoperative lap chole  . Atrial flutter 2011    Postoperative lap chole     Social History [name] [name] reports that he has never smoked. He has never used smokeless tobacco. [name] [name] reports that he drinks alcohol.  Review of Systems Other systems reviewed and negative.  Physical Examination Filed Vitals:   09/24/13 1058  BP: 147/77  Pulse: 64   Filed Weights   09/24/13 1058  Weight: 243 lb (110.224 kg)    Obese male in no acute distress.  HEENT: Conjunctiva and lids normal, oropharynx with moist mucosa.  Neck: Supple, no elevated JVP or carotid bruits, no thyromegaly.  Lungs: Clear to auscultation, nonlabored.  Cardiac: Regular rate and rhythm, no S3.  Abdomen: Soft, nontender, bowel  sounds present.  Skin: Warm and dry, somewhat ruddy complexion.  Extremities: Trace ankle edema, distal pulses 1-2+.  Musculoskeletal: No kyphosis.  Neuropsychiatric: Alert and oriented x3, affect grossly appropriate.   Problem List and Plan   Atrial fibrillation Back in sinus rhythm status post successful DCCV. Continue current regimen, followup arranged in 3 months. We did address the potential for antiarrhythmic therapy, although he wanted to hold off for now.  CAD, NATIVE VESSEL No active angina symptoms.  Essential hypertension, benign No change to current regimen.    [name] [name], M.D., F.A.C.C.

## 2013-09-24 NOTE — Patient Instructions (Signed)
Your physician recommends that you schedule a follow-up appointment in:  3 months  Your physician has recommended you make the following change in your medication:    Please take metoprolol 25 mg twice a day. I have given you a 90 day supply as requested    Thank you for choosing [facility] !

## 2013-09-24 NOTE — Assessment & Plan Note (Signed)
No change to current regimen. 

## 2013-09-24 NOTE — Assessment & Plan Note (Signed)
No active angina symptoms. 

## 2013-09-24 NOTE — Assessment & Plan Note (Signed)
Back in sinus rhythm status post successful DCCV. Continue current regimen, followup arranged in 3 months. We did address the potential for antiarrhythmic therapy, although he wanted to hold off for now.

## 2013-11-02 ENCOUNTER — Ambulatory Visit (INDEPENDENT_AMBULATORY_CARE_PROVIDER_SITE_OTHER): Payer: BC Managed Care – PPO | Admitting: *Deleted

## 2013-11-02 DIAGNOSIS — Z7901 Long term (current) use of anticoagulants: Secondary | ICD-10-CM

## 2013-11-02 DIAGNOSIS — Z5181 Encounter for therapeutic drug level monitoring: Secondary | ICD-10-CM

## 2013-11-02 DIAGNOSIS — I4892 Unspecified atrial flutter: Secondary | ICD-10-CM

## 2013-11-02 LAB — POCT INR: INR: 3.9

## 2013-11-30 ENCOUNTER — Ambulatory Visit (INDEPENDENT_AMBULATORY_CARE_PROVIDER_SITE_OTHER): Payer: BC Managed Care – PPO | Admitting: *Deleted

## 2013-11-30 DIAGNOSIS — I4892 Unspecified atrial flutter: Secondary | ICD-10-CM

## 2013-11-30 DIAGNOSIS — Z7901 Long term (current) use of anticoagulants: Secondary | ICD-10-CM

## 2013-11-30 DIAGNOSIS — Z5181 Encounter for therapeutic drug level monitoring: Secondary | ICD-10-CM

## 2013-11-30 LAB — POCT INR
INR: 6.4
INR: 6.4

## 2013-12-10 ENCOUNTER — Ambulatory Visit (INDEPENDENT_AMBULATORY_CARE_PROVIDER_SITE_OTHER): Payer: BC Managed Care – PPO | Admitting: *Deleted

## 2013-12-10 DIAGNOSIS — I4892 Unspecified atrial flutter: Secondary | ICD-10-CM

## 2013-12-10 DIAGNOSIS — Z5181 Encounter for therapeutic drug level monitoring: Secondary | ICD-10-CM

## 2013-12-10 DIAGNOSIS — Z7901 Long term (current) use of anticoagulants: Secondary | ICD-10-CM

## 2013-12-10 LAB — POCT INR: INR: 2

## 2014-01-06 ENCOUNTER — Ambulatory Visit (INDEPENDENT_AMBULATORY_CARE_PROVIDER_SITE_OTHER): Payer: BC Managed Care – PPO | Admitting: *Deleted

## 2014-01-06 DIAGNOSIS — Z7901 Long term (current) use of anticoagulants: Secondary | ICD-10-CM

## 2014-01-06 DIAGNOSIS — Z5181 Encounter for therapeutic drug level monitoring: Secondary | ICD-10-CM

## 2014-01-06 DIAGNOSIS — I4892 Unspecified atrial flutter: Secondary | ICD-10-CM

## 2014-01-06 LAB — POCT INR: INR: 5.7

## 2014-01-18 ENCOUNTER — Ambulatory Visit (INDEPENDENT_AMBULATORY_CARE_PROVIDER_SITE_OTHER): Payer: BC Managed Care – PPO | Admitting: *Deleted

## 2014-01-18 DIAGNOSIS — Z7901 Long term (current) use of anticoagulants: Secondary | ICD-10-CM

## 2014-01-18 DIAGNOSIS — I4892 Unspecified atrial flutter: Secondary | ICD-10-CM

## 2014-01-18 DIAGNOSIS — Z5181 Encounter for therapeutic drug level monitoring: Secondary | ICD-10-CM

## 2014-01-18 LAB — POCT INR: INR: 2.3

## 2014-02-02 ENCOUNTER — Other Ambulatory Visit: Payer: Self-pay | Admitting: Cardiology

## 2014-02-15 ENCOUNTER — Ambulatory Visit (INDEPENDENT_AMBULATORY_CARE_PROVIDER_SITE_OTHER): Payer: BC Managed Care – PPO | Admitting: *Deleted

## 2014-02-15 DIAGNOSIS — I4892 Unspecified atrial flutter: Secondary | ICD-10-CM

## 2014-02-15 DIAGNOSIS — Z7901 Long term (current) use of anticoagulants: Secondary | ICD-10-CM

## 2014-02-15 DIAGNOSIS — Z5181 Encounter for therapeutic drug level monitoring: Secondary | ICD-10-CM

## 2014-02-15 LAB — POCT INR: INR: 1.6

## 2014-03-08 ENCOUNTER — Ambulatory Visit (INDEPENDENT_AMBULATORY_CARE_PROVIDER_SITE_OTHER): Payer: Medicare Other | Admitting: *Deleted

## 2014-03-08 DIAGNOSIS — Z7901 Long term (current) use of anticoagulants: Secondary | ICD-10-CM

## 2014-03-08 DIAGNOSIS — I4892 Unspecified atrial flutter: Secondary | ICD-10-CM

## 2014-03-08 DIAGNOSIS — Z5181 Encounter for therapeutic drug level monitoring: Secondary | ICD-10-CM

## 2014-03-08 LAB — POCT INR: INR: 2.2

## 2014-03-09 ENCOUNTER — Other Ambulatory Visit: Payer: Self-pay | Admitting: *Deleted

## 2014-03-09 MED ORDER — WARFARIN SODIUM 5 MG PO TABS: 5.0000 mg | ORAL_TABLET | ORAL | Status: DC

## 2014-03-10 ENCOUNTER — Telehealth: Payer: Self-pay | Admitting: *Deleted

## 2014-03-10 MED ORDER — LISINOPRIL 40 MG PO TABS: 40.0000 mg | ORAL_TABLET | Freq: Every day | ORAL | Status: DC

## 2014-03-10 MED ORDER — DILTIAZEM HCL ER COATED BEADS 120 MG PO TB24: 120.0000 mg | ORAL_TABLET | Freq: Every day | ORAL | Status: DC

## 2014-03-10 MED ORDER — METOPROLOL TARTRATE 25 MG PO TABS: 25.0000 mg | ORAL_TABLET | Freq: Two times a day (BID) | ORAL | Status: DC

## 2014-03-10 NOTE — Telephone Encounter (Signed)
Received fax from [facility] for refills. Gave 1 month supply on metoprolol, lisinopril, diltiazem. Pt needs appt for further refills.

## 2014-03-15 ENCOUNTER — Other Ambulatory Visit: Payer: Self-pay | Admitting: *Deleted

## 2014-03-15 MED ORDER — WARFARIN SODIUM 5 MG PO TABS: ORAL_TABLET | ORAL | Status: AC

## 2014-03-16 ENCOUNTER — Other Ambulatory Visit: Payer: Self-pay | Admitting: Cardiology

## 2014-03-16 MED ORDER — METOPROLOL TARTRATE 25 MG PO TABS: 25.0000 mg | ORAL_TABLET | Freq: Two times a day (BID) | ORAL | Status: DC

## 2014-03-16 MED ORDER — DILTIAZEM HCL ER COATED BEADS 120 MG PO TB24: 120.0000 mg | ORAL_TABLET | Freq: Every day | ORAL | Status: DC

## 2014-03-16 MED ORDER — LISINOPRIL 40 MG PO TABS: 40.0000 mg | ORAL_TABLET | Freq: Every day | ORAL | Status: DC

## 2014-03-16 NOTE — Telephone Encounter (Signed)
Please see refill bin / tgs  °

## 2014-03-17 ENCOUNTER — Telehealth: Payer: Self-pay | Admitting: *Deleted

## 2014-03-17 MED ORDER — LISINOPRIL 40 MG PO TABS: 40.0000 mg | ORAL_TABLET | Freq: Every day | ORAL | Status: DC

## 2014-03-17 MED ORDER — DILTIAZEM HCL ER COATED BEADS 120 MG PO TB24: 120.0000 mg | ORAL_TABLET | Freq: Every day | ORAL | Status: DC

## 2014-03-17 MED ORDER — METOPROLOL TARTRATE 25 MG PO TABS: 25.0000 mg | ORAL_TABLET | Freq: Two times a day (BID) | ORAL | Status: DC

## 2014-03-17 NOTE — Telephone Encounter (Signed)
Pt needed appt for further refills. Gave 30 day supply 12/29. Pt needs 90 day supply for insurance and to be able to afford medication. Pt made appt for 12/1 and refilled medications for 90 with no refills until after appointment completed

## 2014-03-17 NOTE — Telephone Encounter (Signed)
Pt states that [facility] is waiting on us to fill 4 [email or web address]

## 2014-03-23 ENCOUNTER — Telehealth: Payer: Self-pay | Admitting: *Deleted

## 2014-03-23 ENCOUNTER — Other Ambulatory Visit: Payer: Self-pay

## 2014-03-23 NOTE — Telephone Encounter (Signed)
PT is upset because [facility] only sent him 30 day supply of medications and it is costing him more money. Looks like we called them in for 90 days. He woulds like for us to call the pharmacy and have it fixed so they will mail him out the other 60 day and fix the amount owed. Then to call patient back.

## 2014-03-23 NOTE — Telephone Encounter (Signed)
[facility] is calling him to explain the situation and will help him

## 2014-03-25 ENCOUNTER — Telehealth: Payer: Self-pay | Admitting: *Deleted

## 2014-03-25 NOTE — Telephone Encounter (Signed)
Pt only wants to speak to Dr. [name]. Explained that Dr. [name] may not call him back today and that he is in the [place] office this week with full schedule but would forward the message to him. Pt understood and will talk to Dr. [name] when he is available

## 2014-03-25 NOTE — Telephone Encounter (Signed)
Pt would like to talk about a RX that was changed

## 2014-03-31 ENCOUNTER — Other Ambulatory Visit: Payer: Self-pay | Admitting: *Deleted

## 2014-03-31 ENCOUNTER — Telehealth: Payer: Self-pay | Admitting: *Deleted

## 2014-03-31 ENCOUNTER — Telehealth: Payer: Self-pay | Admitting: Cardiology

## 2014-03-31 MED ORDER — DILTIAZEM HCL ER COATED BEADS 120 MG PO TB24: 120.0000 mg | ORAL_TABLET | Freq: Every day | ORAL | Status: DC

## 2014-03-31 NOTE — Telephone Encounter (Signed)
Patient states that he took his last Diltiazem today. Patient informed that a 90 day Rx was sent to his mail order pharmacy. Patient states that he wants Diltiazem called in and not Cardizem. Patient states that Cardizem cost him $95 for a 30 day supply. Patient states that he will be without Diltiazem until it comes in the mail. 15 Day supply of Dilt[facility]em sent to [facility]. Patient called to give price of Rx and that it will be delivered tomorrow.

## 2014-03-31 NOTE — Telephone Encounter (Signed)
Received fax from [facility] for diltiazem. Pt request 90 days supply has appt 04/19/14. Medication sent to pharmacy.

## 2014-03-31 NOTE — Telephone Encounter (Signed)
Patient would like to speak with nurse / Dr.McDowell regarding his Diltiazem / tgs

## 2014-04-08 ENCOUNTER — Telehealth: Payer: Self-pay | Admitting: Cardiovascular Disease

## 2014-04-08 MED ORDER — DILTIAZEM HCL ER COATED BEADS 120 MG PO CP24: 120.0000 mg | ORAL_CAPSULE | Freq: Every day | ORAL | Status: DC

## 2014-04-08 NOTE — Telephone Encounter (Signed)
Would like to speak with nurse regarding prescriptions / tgs

## 2014-04-08 NOTE — Telephone Encounter (Signed)
I spoke with Kindra at [facility] and she stated if we changed diltiazem from LA to CD pt's cost would drop from $144 for 90 day supply to $75 for 3 month  Pt also was not getting 90 day shipment (only 30 day) which she was able to fix and pt will receive 90 day supply    Pt will speak with his insurance rep also

## 2014-04-15 ENCOUNTER — Telehealth: Payer: Self-pay | Admitting: *Deleted

## 2014-04-15 NOTE — Telephone Encounter (Signed)
Pt needs diltiazem called in to [facility] he has not got mail order in yet

## 2014-04-16 ENCOUNTER — Telehealth: Payer: Self-pay | Admitting: *Deleted

## 2014-04-16 MED ORDER — DILTIAZEM HCL ER COATED BEADS 120 MG PO CP24: 120.0000 mg | ORAL_CAPSULE | Freq: Every day | ORAL | Status: DC

## 2014-04-16 NOTE — Telephone Encounter (Signed)
[facility] may [facility] to [facility], he will let me know

## 2014-04-16 NOTE — Telephone Encounter (Signed)
Pt needs to speak with [name] [name] about medication refill

## 2014-04-16 NOTE — Telephone Encounter (Signed)
[facility]oke with [place] [facility],will give pt 15 day supply for $12 of Cardizem while he awaits mail order[facility] to deliver   Pt still angry over [facility] and he will speak with insurance agent regarding what he was told by agent  Vs what [facility] is telling him

## 2014-04-19 ENCOUNTER — Encounter: Payer: Self-pay | Admitting: Cardiology

## 2014-04-19 ENCOUNTER — Ambulatory Visit (INDEPENDENT_AMBULATORY_CARE_PROVIDER_SITE_OTHER): Payer: Medicare Other | Admitting: *Deleted

## 2014-04-19 ENCOUNTER — Ambulatory Visit (INDEPENDENT_AMBULATORY_CARE_PROVIDER_SITE_OTHER): Payer: Medicare Other | Admitting: Cardiology

## 2014-04-19 VITALS — BP 150/95 | HR 85 | Ht 70.0 in | Wt 229.6 lb

## 2014-04-19 DIAGNOSIS — I2510 Atherosclerotic heart disease of native coronary artery without angina pectoris: Secondary | ICD-10-CM

## 2014-04-19 DIAGNOSIS — I4892 Unspecified atrial flutter: Secondary | ICD-10-CM

## 2014-04-19 DIAGNOSIS — Z5181 Encounter for therapeutic drug level monitoring: Secondary | ICD-10-CM

## 2014-04-19 DIAGNOSIS — Z7901 Long term (current) use of anticoagulants: Secondary | ICD-10-CM

## 2014-04-19 DIAGNOSIS — I480 Paroxysmal atrial fibrillation: Secondary | ICD-10-CM

## 2014-04-19 LAB — POCT INR: INR: 1.2

## 2014-04-19 MED ORDER — METOPROLOL TARTRATE 50 MG PO TABS: 50.0000 mg | ORAL_TABLET | Freq: Two times a day (BID) | ORAL | Status: AC

## 2014-04-19 NOTE — Assessment & Plan Note (Signed)
Multivessel disease status post CABG as outlined above. No active angina symptoms. He prefers to hold off on stress testing and continue observation. Follow-up in 6 months.

## 2014-04-19 NOTE — Patient Instructions (Signed)
Your physician wants you to follow-up in: 6 months with Dr.McDowell You will receive a reminder letter in the mail two months in advance. If you don't receive a letter, please call our office to schedule the follow-up appointment.   Your physician has recommended you make the following change in your medication:     INCREASE Lopressor to 50 mg twice a day      Thank you for choosing [facility] !

## 2014-04-19 NOTE — Assessment & Plan Note (Signed)
Continue current regimen including Coumadin, Lopressor, and Cardizem CD. We will increase his Lopressor to 50 mg twice daily for now.

## 2014-04-19 NOTE — Progress Notes (Signed)
Reason for visit: CAD, PAF  Clinical Summary [name] [name] is a 64 y.o.male last seen in July 2015. He presents for a routine visit today. States that he generally feels "great." Occasional palpitations, no chest pain. He reports compliance with his medications including Cardizem CD and Lopressor. He has only been taking Lopressor 25 mg twice daily instead of the 50 mg twice daily that we have done in the past. He also continues on Coumadin, bruises easily, no major spontaneous bleeding.  In general he has preferred to hold off on follow-up stress testing. We discussed surveillance testing today, he continues to prefer observation. He states that he does have much faith in stress tests.  No regular exercise plan. He does some walking. He is in the [place] his [place].  He maintains endocrinology follow-up in [place].   Allergies  Allergen Reactions  . Shellfish Allergy Anaphylaxis  . Fish Oil Nausea And Vomiting  . Iohexol      Desc: THROAT SWELLING-REQUIRES 13 HR PREP     Current Outpatient Prescriptions  Medication Sig Dispense Refill  . diltiazem (CARDIZEM CD) 120 MG 24 hr capsule Take 1 capsule (120 mg total) by mouth daily. 90 capsule 3  . furosemide (LASIX) 40 MG tablet Take 1 tablet (40 mg total) by mouth daily. For fluid retention. 90 tablet 3  . levothyroxine (SYNTHROID, LEVOTHROID) 75 MCG tablet Take 1 tablet (75 mcg total) by mouth daily before breakfast. 30 tablet 1  . lisinopril (PRINIVIL,ZESTRIL) 40 MG tablet Take 1 tablet (40 mg total) by mouth daily. 90 tablet 0  . metFORMIN (GLUCOPHAGE) 500 MG tablet Take 500 mg by mouth 2 (two) times daily.      . metoprolol tartrate (LOPRESSOR) 50 MG tablet Take 1 tablet (50 mg total) by mouth 2 (two) times daily. 180 tablet 3  . nitroGLYCERIN (NITROSTAT) 0.4 MG SL tablet Place 1 tablet (0.4 mg total) under the tongue every 5 (five) minutes as needed. For chest pains. 25 tablet  3  . potassium chloride SA (K-DUR,KLOR-CON) 20 MEQ tablet Take 1 tablet (20 mEq total) by mouth daily. 90 tablet 3  . predniSONE (DELTASONE) 20 MG tablet Take 20 mg by mouth as needed.    . warfarin (COUMADIN) 5 MG tablet Take 1/2 tablet daily except 1 tablet on Sundays, Tuesdays and Thursdays 90 tablet 3  . [DISCONTINUED] enoxaparin (LOVENOX) 100 MG/ML injection Inject 1 mL (100 mg total) into the skin every 12 (twelve) hours. 10 mL 0  . [DISCONTINUED] glimepiride (AMARYL) 4 MG tablet Take 1 tablet (4 mg total) by mouth daily with breakfast. 30 tablet 1   No current facility-administered medications for this visit.    Past Medical History  Diagnosis Date  . Coronary atherosclerosis of native coronary artery     Multivessel, LVEF 65%  . Superficial thrombophlebitis   . Varicose veins   . Diabetes mellitus, type 2   . Hyperlipidemia   . Chronic back pain   . Essential hypertension, benign   . Paroxysmal atrial fibrillation   . Atrial flutter 2011    Postoperative lap chole    Past Surgical History  Procedure Laterality Date  . Coronary artery bypass graft  2004    LIMA to LAD, SVG to D1 and OM1, SVG to D3, and SVG to PDA  . Cholecystectomy    . Cardiac catheterization  2008    L Main 40%, LAD 100%, CFX (dominant) 50%, OM2  80%, RCA OK, All grafts patent, SVG-PDA with 50% stenosis, EF nl  . Tee without cardioversion N/A 06/01/2013    Procedure: TRANSESOPHAGEAL ECHOCARDIOGRAM (TEE);  Surgeon: Chrystie NoseKenneth C. Hilty, MD;  [facility];  Service: Cardiovascular;  Laterality: N/A;  . Cardioversion N/A 06/01/2013    Procedure: CARDIOVERSION;  Surgeon: Chrystie NoseKenneth C. Hilty, MD;  [facility];  Service: Cardiovascular;  Laterality: N/A;  9:59 anesthesiologist present, giving Lido 40 mg,IV followed by Propofol 110 mg,IV  10:02 synced cardioversion @ 150 joules, SR with PAC's    . Cardioversion N/A 09/04/2013    Procedure: CARDIOVERSION;  Surgeon: Antoine PocheJonathan F Branch, MD;  Location:  [facility];  Service: Endoscopy;  Laterality: N/A;  Moderate sedation with assistance from anethesia    Social History [name] [name] reports that he has never smoked. He has never used smokeless tobacco. [name] [name] reports that he drinks alcohol.  Review of Systems Complete review of systems negative except as otherwise outlined in the clinical summary and also the following. No orthopnea or PND. No recent leg edema.  Physical Examination Filed Vitals:   04/19/14 1537  BP: 150/95  Pulse: 85    Wt Readings from Last 3 Encounters:  04/19/14 229 lb 9.6 oz (104.146 kg)  09/24/13 243 lb (110.224 kg)  09/02/13 240 lb (108.863 kg)   Obese male in no acute distress.  HEENT: Conjunctiva and lids normal, oropharynx with moist mucosa.  Neck: Supple, no elevated JVP or carotid bruits, no thyromegaly.  Lungs: Clear to auscultation, nonlabored.  Cardiac: Regular rate and rhythm with ectopy, no S3.  Abdomen: Soft, nontender, bowel sounds present.  Skin: Warm and dry, somewhat ruddy complexion.  Extremities: Trace ankle edema, distal pulses 1-2+.  Musculoskeletal: No kyphosis.  Neuropsychiatric: Alert and oriented x3, affect grossly appropriate.   Problem List and Plan   Atrial fibrillation Continue current regimen including Coumadin, Lopressor, and Cardizem CD. We will increase his Lopressor to 50 mg twice daily for now.   CAD, NATIVE VESSEL Multivessel disease status post CABG as outlined above. No active angina symptoms. He prefers to hold off on stress testing and continue observation. Follow-up in 6 months.     Jonelle SidleSamuel G. Tyaisha Cullom, M.D., F.A.C.C.

## 2014-04-28 ENCOUNTER — Other Ambulatory Visit: Payer: Self-pay | Admitting: *Deleted

## 2014-04-28 MED ORDER — DILTIAZEM HCL ER COATED BEADS 120 MG PO CP24: 120.0000 mg | ORAL_CAPSULE | Freq: Every day | ORAL | Status: DC

## 2014-04-28 NOTE — Telephone Encounter (Signed)
DILTIAZAM NEED CALLED IN TO [facility]

## 2014-04-28 NOTE — Telephone Encounter (Signed)
[facility]

## 2014-05-02 ENCOUNTER — Emergency Department (HOSPITAL_COMMUNITY)
Admission: EM | Admit: 2014-05-02 | Discharge: 2014-05-02 | Disposition: A | Discharge status: Home / Self Care | Payer: Medicare Other | Attending: Emergency Medicine | Admitting: Emergency Medicine

## 2014-05-02 ENCOUNTER — Encounter (HOSPITAL_COMMUNITY): Payer: Self-pay | Admitting: Emergency Medicine

## 2014-05-02 DIAGNOSIS — L308 Other specified dermatitis: Secondary | ICD-10-CM | POA: Insufficient documentation

## 2014-05-02 DIAGNOSIS — Z7952 Long term (current) use of systemic steroids: Secondary | ICD-10-CM | POA: Diagnosis not present

## 2014-05-02 DIAGNOSIS — Z79899 Other long term (current) drug therapy: Secondary | ICD-10-CM | POA: Diagnosis not present

## 2014-05-02 DIAGNOSIS — L739 Follicular disorder, unspecified: Secondary | ICD-10-CM | POA: Diagnosis not present

## 2014-05-02 DIAGNOSIS — I872 Venous insufficiency (chronic) (peripheral): Secondary | ICD-10-CM | POA: Diagnosis not present

## 2014-05-02 DIAGNOSIS — Z720 Tobacco use: Secondary | ICD-10-CM | POA: Diagnosis not present

## 2014-05-02 DIAGNOSIS — R2242 Localized swelling, mass and lump, left lower limb: Secondary | ICD-10-CM | POA: Diagnosis present

## 2014-05-02 DIAGNOSIS — I10 Essential (primary) hypertension: Secondary | ICD-10-CM | POA: Insufficient documentation

## 2014-05-02 DIAGNOSIS — E119 Type 2 diabetes mellitus without complications: Secondary | ICD-10-CM | POA: Insufficient documentation

## 2014-05-02 DIAGNOSIS — E785 Hyperlipidemia, unspecified: Secondary | ICD-10-CM | POA: Insufficient documentation

## 2014-05-02 DIAGNOSIS — Z7901 Long term (current) use of anticoagulants: Secondary | ICD-10-CM | POA: Insufficient documentation

## 2014-05-02 DIAGNOSIS — I2510 Atherosclerotic heart disease of native coronary artery without angina pectoris: Secondary | ICD-10-CM | POA: Insufficient documentation

## 2014-05-02 DIAGNOSIS — I480 Paroxysmal atrial fibrillation: Secondary | ICD-10-CM | POA: Insufficient documentation

## 2014-05-02 DIAGNOSIS — Z951 Presence of aortocoronary bypass graft: Secondary | ICD-10-CM | POA: Insufficient documentation

## 2014-05-02 DIAGNOSIS — G8929 Other chronic pain: Secondary | ICD-10-CM | POA: Diagnosis not present

## 2014-05-02 DIAGNOSIS — R21 Rash and other nonspecific skin eruption: Secondary | ICD-10-CM

## 2014-05-02 MED ORDER — CEPHALEXIN 500 MG PO CAPS: 500.0000 mg | ORAL_CAPSULE | Freq: Four times a day (QID) | ORAL | Status: DC

## 2014-05-02 NOTE — ED Provider Notes (Signed)
CSN: [id number]     Arrival date & time 05/02/14  1528 History  This chart was scribed for Hilario Quarry, MD by [facility], ED Scribe. The patient was seen in [facility] and the patient's care was started at 4:30 PM.    Chief Complaint  Patient presents with  . Leg Swelling   The history is provided by the patient. No language interpreter was used.    HPI Comments: [name] [name] is a 64 y.o. male who presents to the Emergency Department complaining of bilateral leg swelling onset this morning. He is mainly concerned with "three little knots" on his feet  which are very tender and painful to the touch. They appeared at the same time, and around the time he removed his "skin tag" on his right foot. Pt is on coumadin for an irregular heart rhythm. Typically, his leg swelling is associated with the irregular heart rhythm. Dr. Diona Browner is is his cardiologist, PCP is at [facility] internal medicine. Pt is a type two diabetic. He has lasix but is not taking it. No fever, CP, SOB, chills, vomiting diarrhea, abdominal pain.  Past Medical History  Diagnosis Date  . Coronary atherosclerosis of native coronary artery     Multivessel, LVEF 65%  . Superficial thrombophlebitis   . Varicose veins   . Diabetes mellitus, type 2   . Hyperlipidemia   . Chronic back pain   . Essential hypertension, benign   . Paroxysmal atrial fibrillation   . Atrial flutter 2011    Postoperative lap chole   Past Surgical History  Procedure Laterality Date  . Coronary artery bypass graft  2004    LIMA to LAD, SVG to D1 and OM1, SVG to D3, and SVG to PDA  . Cholecystectomy    . Cardiac catheterization  2008    L Main 40%, LAD 100%, CFX (dominant) 50%, OM2 80%, RCA OK, All grafts patent, SVG-PDA with 50% stenosis, EF nl  . Tee without cardioversion N/A 06/01/2013    Procedure: TRANSESOPHAGEAL ECHOCARDIOGRAM (TEE);  Surgeon: Chrystie Nose, MD;  Location: [facility] ENDOSCOPY;  Service: Cardiovascular;  Laterality: N/A;  .  Cardioversion N/A 06/01/2013    Procedure: CARDIOVERSION;  Surgeon: Chrystie Nose, MD;  Location: [facility] ENDOSCOPY;  Service: Cardiovascular;  Laterality: N/A;  9:59 anesthesiologist present, giving Lido 40 mg,IV followed by Propofol 110 mg,IV  10:02 synced cardioversion @ 150 joules, SR with PAC's    . Cardioversion N/A 09/04/2013    Procedure: CARDIOVERSION;  Surgeon: Antoine Poche, MD;  Location: [facility];  Service: Endoscopy;  Laterality: N/A;  Moderate sedation with assistance from anethesia   History reviewed. No pertinent family history. History  Substance Use Topics  . Smoking status: Never Smoker   . Smokeless tobacco: Never Used  . Alcohol Use: 0.0 oz/week    3-4 Shots of liquor, 2-3 Cans of beer per week     Comment: socially    Review of Systems  Constitutional: Negative for fever and chills.  Respiratory: Negative for shortness of breath.   Cardiovascular: Positive for leg swelling. Negative for chest pain.  Gastrointestinal: Negative for vomiting, abdominal pain and diarrhea.  All other systems reviewed and are negative.  Allergies  Shellfish allergy; Fish oil; and Iohexol  Home Medications   Prior to Admission medications   Medication Sig Start Date End Date Taking? Authorizing Provider  diltiazem (CARDIZEM CD) 120 MG 24 hr capsule Take 1 capsule (120 mg total) by mouth daily. 04/28/14  Jonelle SidleSamuel G McDowell, MD  furosemide (LASIX) 40 MG tablet Take 1 tablet (40 mg total) by mouth daily. For fluid retention. 06/04/13   Jodelle GrossKathryn M Lawrence, NP  levothyroxine (SYNTHROID, LEVOTHROID) 75 MCG tablet Take 1 tablet (75 mcg total) by mouth daily before breakfast. 06/01/13   Vassie Lollarlos Madera, MD  lisinopril (PRINIVIL,ZESTRIL) 40 MG tablet Take 1 tablet (40 mg total) by mouth daily. 03/17/14   Jonelle SidleSamuel G McDowell, MD  metFORMIN (GLUCOPHAGE) 500 MG tablet Take 500 mg by mouth 2 (two) times daily.      Historical Provider, MD  metoprolol tartrate (LOPRESSOR) 50 MG tablet Take 1 tablet (50  mg total) by mouth 2 (two) times daily. 04/19/14   Jonelle SidleSamuel G McDowell, MD  nitroGLYCERIN (NITROSTAT) 0.4 MG SL tablet Place 1 tablet (0.4 mg total) under the tongue every 5 (five) minutes as needed. For chest pains. 06/01/13   Jonelle SidleSamuel G McDowell, MD  potassium chloride SA (K-DUR,KLOR-CON) 20 MEQ tablet Take 1 tablet (20 mEq total) by mouth daily. 07/01/13   Jodelle GrossKathryn M Lawrence, NP  predniSONE (DELTASONE) 20 MG tablet Take 20 mg by mouth as needed. 06/01/13   Vassie Lollarlos Madera, MD  warfarin (COUMADIN) 5 MG tablet Take 1/2 tablet daily except 1 tablet on Sundays, Tuesdays and Thursdays 03/15/14   Samuel G McDowell, MD   BP 188/84 mmHg  Pulse 56  Temp(Src) 98 F (36.7 C) (Oral)  Resp 16  Ht 5\' 10" (1.778 m)  Wt 229 lb (103.874 kg)  BMI 32.86 kg/m2  SpO2 99% Physical Exam  Constitutional: He is oriented to person, place, and time. He appears well-developed and well-nourished.  HENT:  Head: Normocephalic and atraumatic.  Right Ear: External ear normal.  Left Ear: External ear normal.  Nose: Nose normal.  Mouth/Throat: Oropharynx is clear and moist.  Eyes: Conjunctivae and EOM are normal. Pupils are equal, round, and reactive to light.  Neck: Normal range of motion. Neck supple.  Cardiovascular: Normal rate, normal heart sounds and intact distal pulses.  An irregularly irregular rhythm present.  Pulmonary/Chest: Effort normal and breath sounds normal.  Abdominal: Soft. Bowel sounds are normal.  Musculoskeletal: Normal range of motion. He exhibits edema.  Neurological: He is alert and oriented to person, place, and time. He has normal reflexes.  Skin: Skin is warm and dry.  Chronic venous stasis changes bilateral lower extremities.  Three tender palpable nodules right dorsal foot.  Some pitting edema bilaterally  Psychiatric: He has a normal mood and affect. His behavior is normal. Judgment and thought content normal.  Nursing note and vitals reviewed.   ED Course  Procedures  DIAGNOSTIC  STUDIES: Oxygen Saturation is 99% on room air, normal by my interpretation.    COORDINATION OF CARE: 4:39 PM Discussed treatment plan with pt at bedside and pt agreed to plan.  Labs Review Labs Reviewed - No data to display  Imaging Review No results found.   EKG Interpretation   Date/Time:  Sunday May 02 2014 16:05:33 EST Ventricular Rate:  72 PR Interval:  173 QRS Duration: 73 QT Interval:  428 QTC Calculation: 468 R Axis:   69 Text Interpretation:  Ectopic atrial rhythm Ventricular bigeminy Low  voltage, precordial leads Nonspecific T abnormalities, lateral leads  Confirmed by COOK  MD, BRIAN ([id number]) on 05/02/2014 4:08:57 PM      MDM   Final diagnoses:  Maculopapular rash  Folliculitis  Chronic venous stasis dermatitis of both lower extremities       63  year old male history of diabetes  and chronic venous stasis with history of A. fib on Coumadin presents today with some increased bilateral edema but on the right foot has 3 tender nodules 2 of which appear to have some papular component. Patient is at increased risk for infection due to his diabetes and chronic venous stasis and will be treated with Keflex. He is given return precautions and voices understanding.  History/physical exam/procedure(s) were performed by non-physician practitioner and as supervising physician I was immediately available for consultation/collaboration. I have reviewed all notes and am in agreement with care and plan.   Hilario Quarry, MD 05/05/14 [id number]

## 2014-05-02 NOTE — ED Notes (Addendum)
Pt reports leg swelling since this am. Moderate redness and discoloration noted to BLE. Pt reports is on coumadin. Pt denies any sob. Moderate edema noted to BLE. Cap refill >3 secs. Pt reports "my heart gets out of rhythm when my legs swell."

## 2014-05-02 NOTE — Discharge Instructions (Signed)

## 2014-05-05 ENCOUNTER — Ambulatory Visit (INDEPENDENT_AMBULATORY_CARE_PROVIDER_SITE_OTHER): Payer: Medicare Other | Admitting: *Deleted

## 2014-05-05 DIAGNOSIS — I4892 Unspecified atrial flutter: Secondary | ICD-10-CM

## 2014-05-05 DIAGNOSIS — Z5181 Encounter for therapeutic drug level monitoring: Secondary | ICD-10-CM

## 2014-05-05 DIAGNOSIS — Z7901 Long term (current) use of anticoagulants: Secondary | ICD-10-CM

## 2014-05-05 LAB — POCT INR: INR: 1.7

## 2014-05-31 ENCOUNTER — Ambulatory Visit (INDEPENDENT_AMBULATORY_CARE_PROVIDER_SITE_OTHER): Payer: Medicare Other | Admitting: *Deleted

## 2014-05-31 DIAGNOSIS — I4892 Unspecified atrial flutter: Secondary | ICD-10-CM

## 2014-05-31 DIAGNOSIS — Z7901 Long term (current) use of anticoagulants: Secondary | ICD-10-CM | POA: Diagnosis not present

## 2014-05-31 DIAGNOSIS — Z5181 Encounter for therapeutic drug level monitoring: Secondary | ICD-10-CM | POA: Diagnosis not present

## 2014-05-31 LAB — POCT INR: INR: 3.7

## 2014-06-09 ENCOUNTER — Ambulatory Visit (INDEPENDENT_AMBULATORY_CARE_PROVIDER_SITE_OTHER): Payer: Medicare Other | Admitting: Internal Medicine

## 2014-06-09 ENCOUNTER — Encounter: Payer: Self-pay | Admitting: Internal Medicine

## 2014-06-09 VITALS — BP 126/74 | HR 68 | Ht 70.0 in | Wt 222.0 lb

## 2014-06-09 DIAGNOSIS — I10 Essential (primary) hypertension: Secondary | ICD-10-CM | POA: Diagnosis not present

## 2014-06-09 DIAGNOSIS — R0600 Dyspnea, unspecified: Secondary | ICD-10-CM

## 2014-06-09 DIAGNOSIS — I2510 Atherosclerotic heart disease of native coronary artery without angina pectoris: Secondary | ICD-10-CM

## 2014-06-09 NOTE — Progress Notes (Signed)
Subjective:    Patient ID: [name] [name], male    DOB: [birth year],   MRN: [id number]  HPI  Brief patient profile:  [age] yowm never smoker with h/o MI 2004  and c/o sob ever since cabg not unable to blow enough to activate a ignition lock  In 2012 and  Referred for spirometry that did not show significant obstruction but restriction ? Wt  based   was not allowed to drive s interlock since which he says he still can't use.  History of Present Illness  01/03/2011 Initial pulmonary office eval in EMR era  Cc sob x one flight  Ok flat surface but limited by feet from walking more than a grocery store and no handicap parking needed.  Sensation that can't take a breath in. No cough. Sleeping ok without nocturnal  or early am exacerbation  of respiratory  C/o's - symptoms not variable  -  denies any obvious fluctuation of symptoms with weather or environmental changes or other aggravating or alleviating factors except as outlined above. >>rec change ACE to Benicar   01/29/2011 NP note  Returns for follow up and PFT.   PFT shows FEV1 1.71l/m (53%), no significant obst/ Change s/p SABA, ratio of 77, DLCO 58% He has paperwork to be completed for DUI for a ignition interlock waiver form.  Last ov pt was changed off ACE to Benicar HCT due to possible upper airway inflammation .  Pt was unable to stay off ACE inhibitor last ov, due to flare of GOUT (combination Benicar HCT) .  We discussed today taking Benicar alone- this is acceptable to the pt   06/09/2014  Seen as new pt/ re-establish/ Pulmonary office visit/ [name] [name]  / back on ACEi Chief Complaint  Patient presents with  . Pulmonary Consult    Pt here per the request of DME- he states unable to operate ingition interlock system.    has not been allowed to drive since last eval  Able to walk to mailbox and back limited by neuropathy > sob   No obvious other patterns in day to day or daytime variabilty or assoc chronic cough or cp or chest tightness,  subjective wheeze overt sinus or hb symptoms. No unusual exp hx or h/o childhood pna/ asthma or knowledge of premature birth.  Sleeping ok without nocturnal  or early am exacerbation  of respiratory  c/o's or need for noct saba. Also denies any obvious fluctuation of symptoms with weather or environmental changes or other aggravating or alleviating factors except as outlined above   Current Medications, Allergies, Complete Past Medical History, Past Surgical History, Family History, and Social History were reviewed in [facility] electronic [facility] record.             Review of Systems  Constitutional: Negative for fever, chills, activity change, appetite change and unexpected weight change.  HENT: Negative for congestion, dental problem, postnasal drip, rhinorrhea, sneezing, sore throat, trouble swallowing and voice change.   Eyes: Negative for visual disturbance.  Respiratory: Negative for cough, choking and shortness of breath.   Cardiovascular: Negative for chest pain and leg swelling.  Gastrointestinal: Negative for nausea, vomiting and abdominal pain.  Genitourinary: Negative for difficulty urinating.  Musculoskeletal: Negative for arthralgias.  Skin: Negative for rash.  Psychiatric/Behavioral: Negative for behavioral problems and confusion.       Objective:   Physical Exam  amb wm nad moderately hoarse   Wt Readings from Last 3 Encounters:  06/09/14 222 lb (100.699  kg)  05/02/14 229 lb (103.874 kg)  04/19/14 229 lb 9.6 oz (104.146 kg)    Vital signs reviewed  HEENT: nl dentition, turbinates, and orophanx. Nl external ear canals without cough reflex   NECK :  without JVD/Nodes/TM/ nl carotid upstrokes bilaterally   LUNGS: no acc muscle use, clear to A and P bilaterally without cough on insp or exp maneuvers   CV:  RRR  no s3 or murmur or increase in P2, no edema   ABD:  soft and nontender with nl excursion in the supine position. No bruits or organomegaly,  bowel sounds nl  MS:  warm without deformities, calf tenderness, cyanosis or clubbing  SKIN: warm and dry without lesions    NEURO:  alert, approp, no deficits     I personally reviewed images and agree with radiology impression as follows:  CXR:  05/30/14 1. There is mild stable prominence of the pulmonary interstitial markings in the setting of enlargement of the cardiac silhouette. The possibility of low-grade compensated CHF is raised. 2. There is no evidence of pneumonia nor pleural effusion nor pneumothorax.     Assessment & Plan:

## 2014-06-09 NOTE — Patient Instructions (Addendum)
I strongly recommend you be provided with a substitute to the lisinopril for a period of 3 months and not satisfied with your breathing or coughing and Dr [name] agrees it's not your heart then you need to return here for re-evaluation  [facility]

## 2014-06-10 ENCOUNTER — Encounter: Payer: Self-pay | Admitting: Internal Medicine

## 2014-06-10 NOTE — Assessment & Plan Note (Addendum)
-   PFTs 01/29/11  FEV1  1.85(57%) ratio 80,  ERV 35 dlco 58 corrrects to 168    Symptoms are markedly disproportionate to objective findings and not clear this is a lung problem but pt does appear to have difficult airway management issues. DDX of  difficult airways management all start with A and  include Adherence, Ace Inhibitors, Acid Reflux, Active Sinus Disease, Alpha 1 Antitripsin deficiency, Anxiety masquerading as Airways dz,  ABPA,  allergy(esp in young), Aspiration (esp in elderly), Adverse effects of DPI,  Active smokers, plus two Bs  = Bronchiectasis and Beta blocker use..and one C= CHF   ACEi at top of usual list of suspects, needs to be off x 6 weeks to see what if any difference this makes  If not better, ? Acid (or non-acid) GERD > always difficult to exclude as up to 75% of pts in some series report no assoc GI/ Heartburn symptoms > would rx this next  ? Anxiety, usually dx of exclusion.  Also strongly suspect depression/ obesity/ deconditioning but don't see a primary pulmonary problem here so pulmonary f/u is prn

## 2014-06-10 NOTE — Assessment & Plan Note (Signed)
See dyspnea a/p same date  ACE inhibitors are problematic in  pts with airway complaints because  even experienced pulmonologists can't always distinguish ace effects from copd/asthma.  By themselves they don't actually cause a problem, much like oxygen can't by itself start a fire, but they certainly serve as a powerful catalyst or enhancer for any "fire"  or inflammatory process in the upper airway, be it caused by an ET  tube or more commonly reflux (especially in the obese or pts with known GERD or who are on biphoshonates).    In the era of ARB near equivalency until we have a better handle on the reversibility of the airway problem, it just makes sense to avoid ACEI  Entirely for 6 weeks then return to pulmonary clinic to regroup if not better to his satisfaction   Defer rx to Dr [name]

## 2014-06-28 ENCOUNTER — Ambulatory Visit (INDEPENDENT_AMBULATORY_CARE_PROVIDER_SITE_OTHER): Payer: Medicare Other | Admitting: *Deleted

## 2014-06-28 ENCOUNTER — Telehealth: Payer: Self-pay | Admitting: Cardiology

## 2014-06-28 ENCOUNTER — Other Ambulatory Visit: Payer: Self-pay | Admitting: *Deleted

## 2014-06-28 DIAGNOSIS — I4892 Unspecified atrial flutter: Secondary | ICD-10-CM

## 2014-06-28 DIAGNOSIS — Z7901 Long term (current) use of anticoagulants: Secondary | ICD-10-CM

## 2014-06-28 DIAGNOSIS — Z5181 Encounter for therapeutic drug level monitoring: Secondary | ICD-10-CM | POA: Diagnosis not present

## 2014-06-28 LAB — POCT INR: INR: 5.7

## 2014-06-28 MED ORDER — LISINOPRIL 40 MG PO TABS: 40.0000 mg | ORAL_TABLET | Freq: Every day | ORAL | Status: DC

## 2014-06-28 NOTE — Telephone Encounter (Signed)
Patient called and states [facility] told him that his Lisinopril will not be ready until 4/22. Nurse called [facility] to check on status of order and notified them that the patient only has a day supply left. [facility] assured nurse that [name]'s med's would arrive on time.

## 2014-06-28 NOTE — Telephone Encounter (Signed)
Please call patient regarding prescriptions / tg  °

## 2014-06-30 ENCOUNTER — Telehealth: Payer: Self-pay | Admitting: *Deleted

## 2014-06-30 NOTE — Telephone Encounter (Signed)
Spoke with [facility],they already processed lisinopril order

## 2014-07-07 ENCOUNTER — Encounter (HOSPITAL_COMMUNITY): Payer: Medicare Other

## 2014-07-12 ENCOUNTER — Ambulatory Visit (INDEPENDENT_AMBULATORY_CARE_PROVIDER_SITE_OTHER): Payer: Medicare Other | Admitting: *Deleted

## 2014-07-12 DIAGNOSIS — Z5181 Encounter for therapeutic drug level monitoring: Secondary | ICD-10-CM

## 2014-07-12 DIAGNOSIS — Z7901 Long term (current) use of anticoagulants: Secondary | ICD-10-CM | POA: Diagnosis not present

## 2014-07-12 DIAGNOSIS — I4892 Unspecified atrial flutter: Secondary | ICD-10-CM

## 2014-07-12 LAB — POCT INR: INR: 5

## 2014-09-08 ENCOUNTER — Ambulatory Visit (INDEPENDENT_AMBULATORY_CARE_PROVIDER_SITE_OTHER): Payer: Medicare Other | Admitting: *Deleted

## 2014-09-08 DIAGNOSIS — Z7901 Long term (current) use of anticoagulants: Secondary | ICD-10-CM

## 2014-09-08 DIAGNOSIS — Z5181 Encounter for therapeutic drug level monitoring: Secondary | ICD-10-CM | POA: Diagnosis not present

## 2014-09-08 DIAGNOSIS — I4892 Unspecified atrial flutter: Secondary | ICD-10-CM

## 2014-09-08 LAB — POCT INR: INR: 2

## 2014-10-12 ENCOUNTER — Telehealth: Payer: Self-pay | Admitting: Cardiology

## 2014-10-12 NOTE — Telephone Encounter (Signed)
Called spoke with pt, pt inquiring about Adult nurse physicians in [place], [place].  Advised pt we do not have a clinic in [place], will need to seek out a local primary care MD or cardiologist in [place] to manage pt's Coumadin.  Advised pt once he finds MD to manage, to call us back and notify us of MD change.  If records needed can request those to be faxed once finds new MD.  Pt verbalized understanding.

## 2014-10-12 NOTE — Telephone Encounter (Signed)
Needs advise on who can check his coumadin. He has moved to [place]

## 2014-11-05 ENCOUNTER — Ambulatory Visit: Payer: Medicare Other | Admitting: Cardiology

## 2015-04-09 ENCOUNTER — Other Ambulatory Visit: Payer: Self-pay | Admitting: Cardiology

## 2015-04-12 ENCOUNTER — Ambulatory Visit: Payer: Self-pay | Admitting: *Deleted

## 2015-04-12 DIAGNOSIS — Z5181 Encounter for therapeutic drug level monitoring: Secondary | ICD-10-CM

## 2015-04-12 DIAGNOSIS — I4892 Unspecified atrial flutter: Secondary | ICD-10-CM

## 2015-04-19 IMAGING — CR DG CHEST 2V
2 series · 2 of 2 positions shown · non-contrast
Comparison: DG CHEST 2 VIEW dated 11/25/2012

CLINICAL DATA: Tachycardia, history of hypertension

EXAM:
CHEST  2 VIEW

[w chest pa]
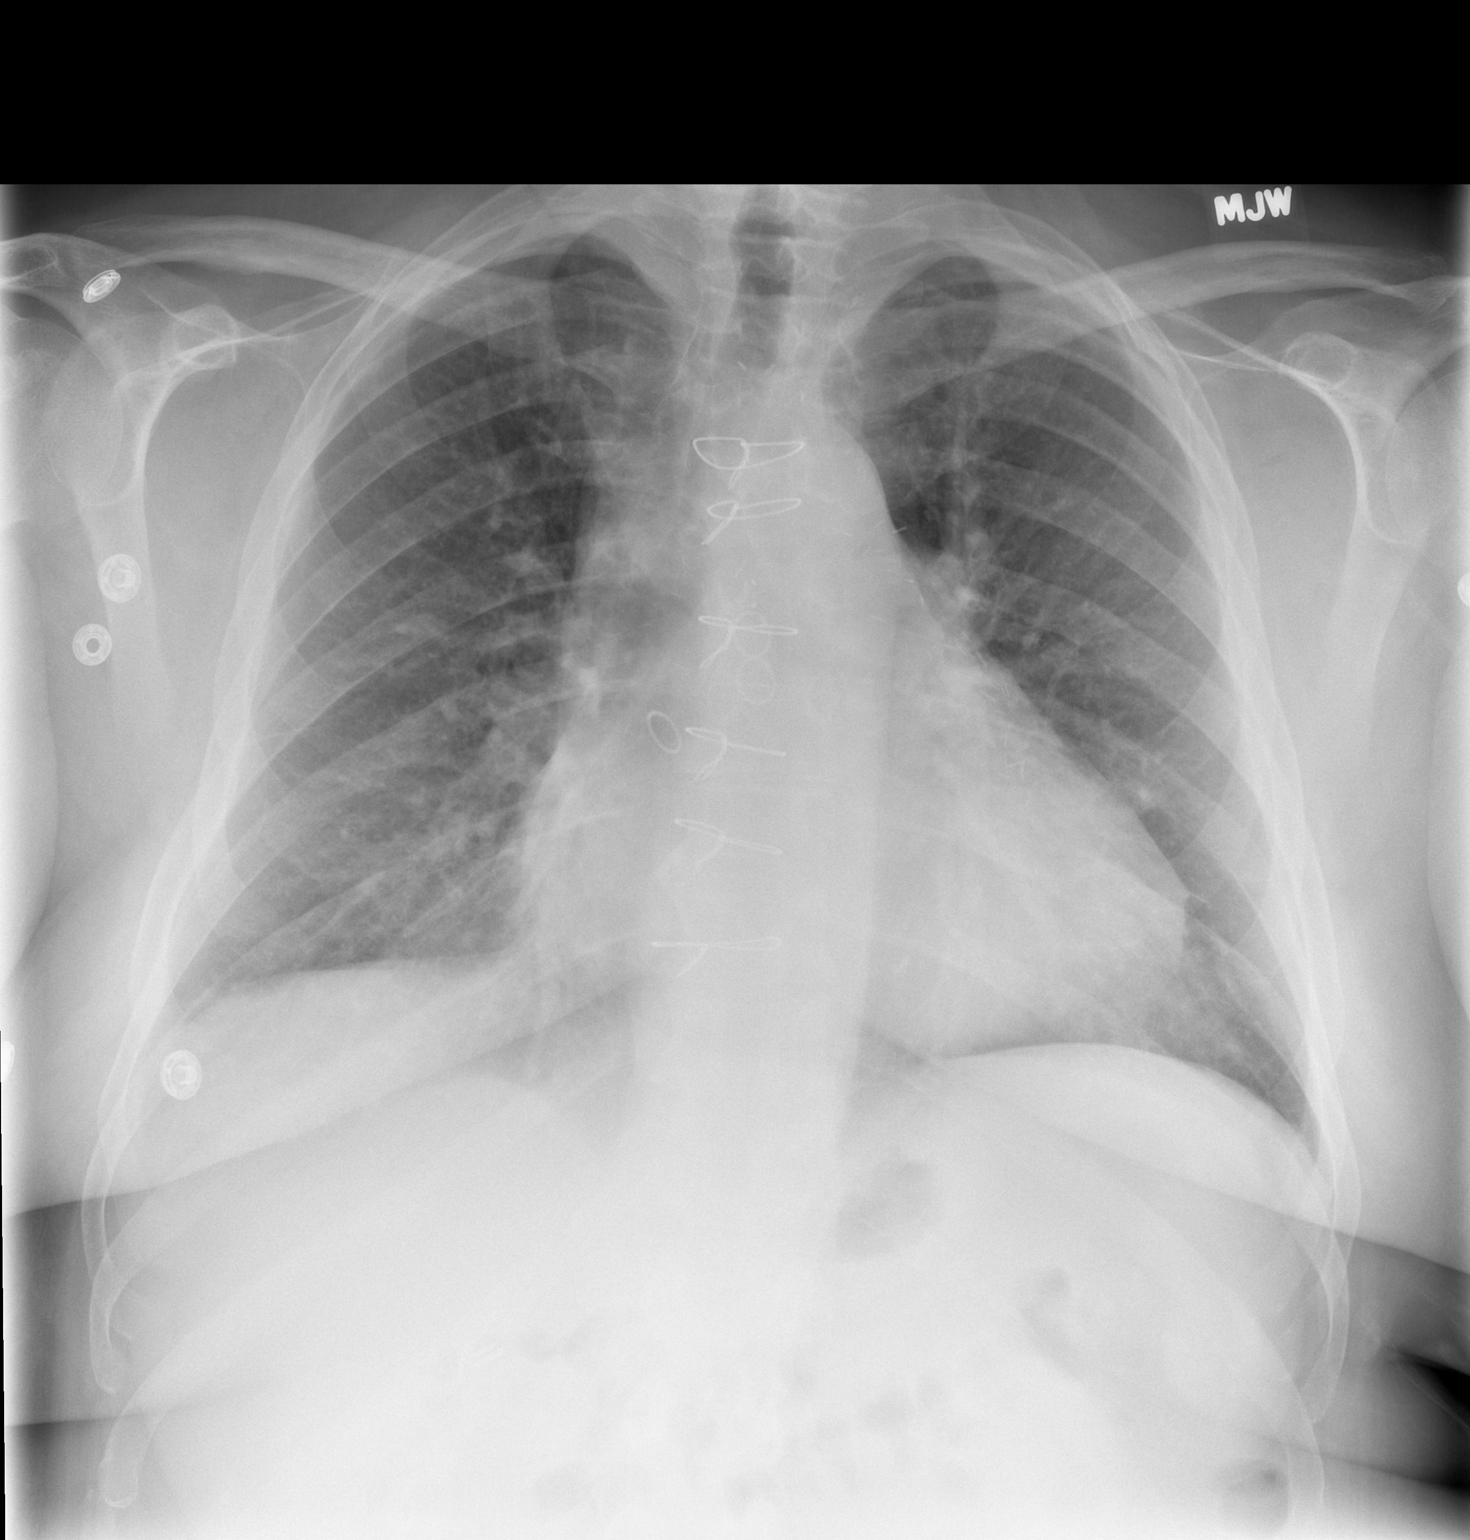

[w chest lat]
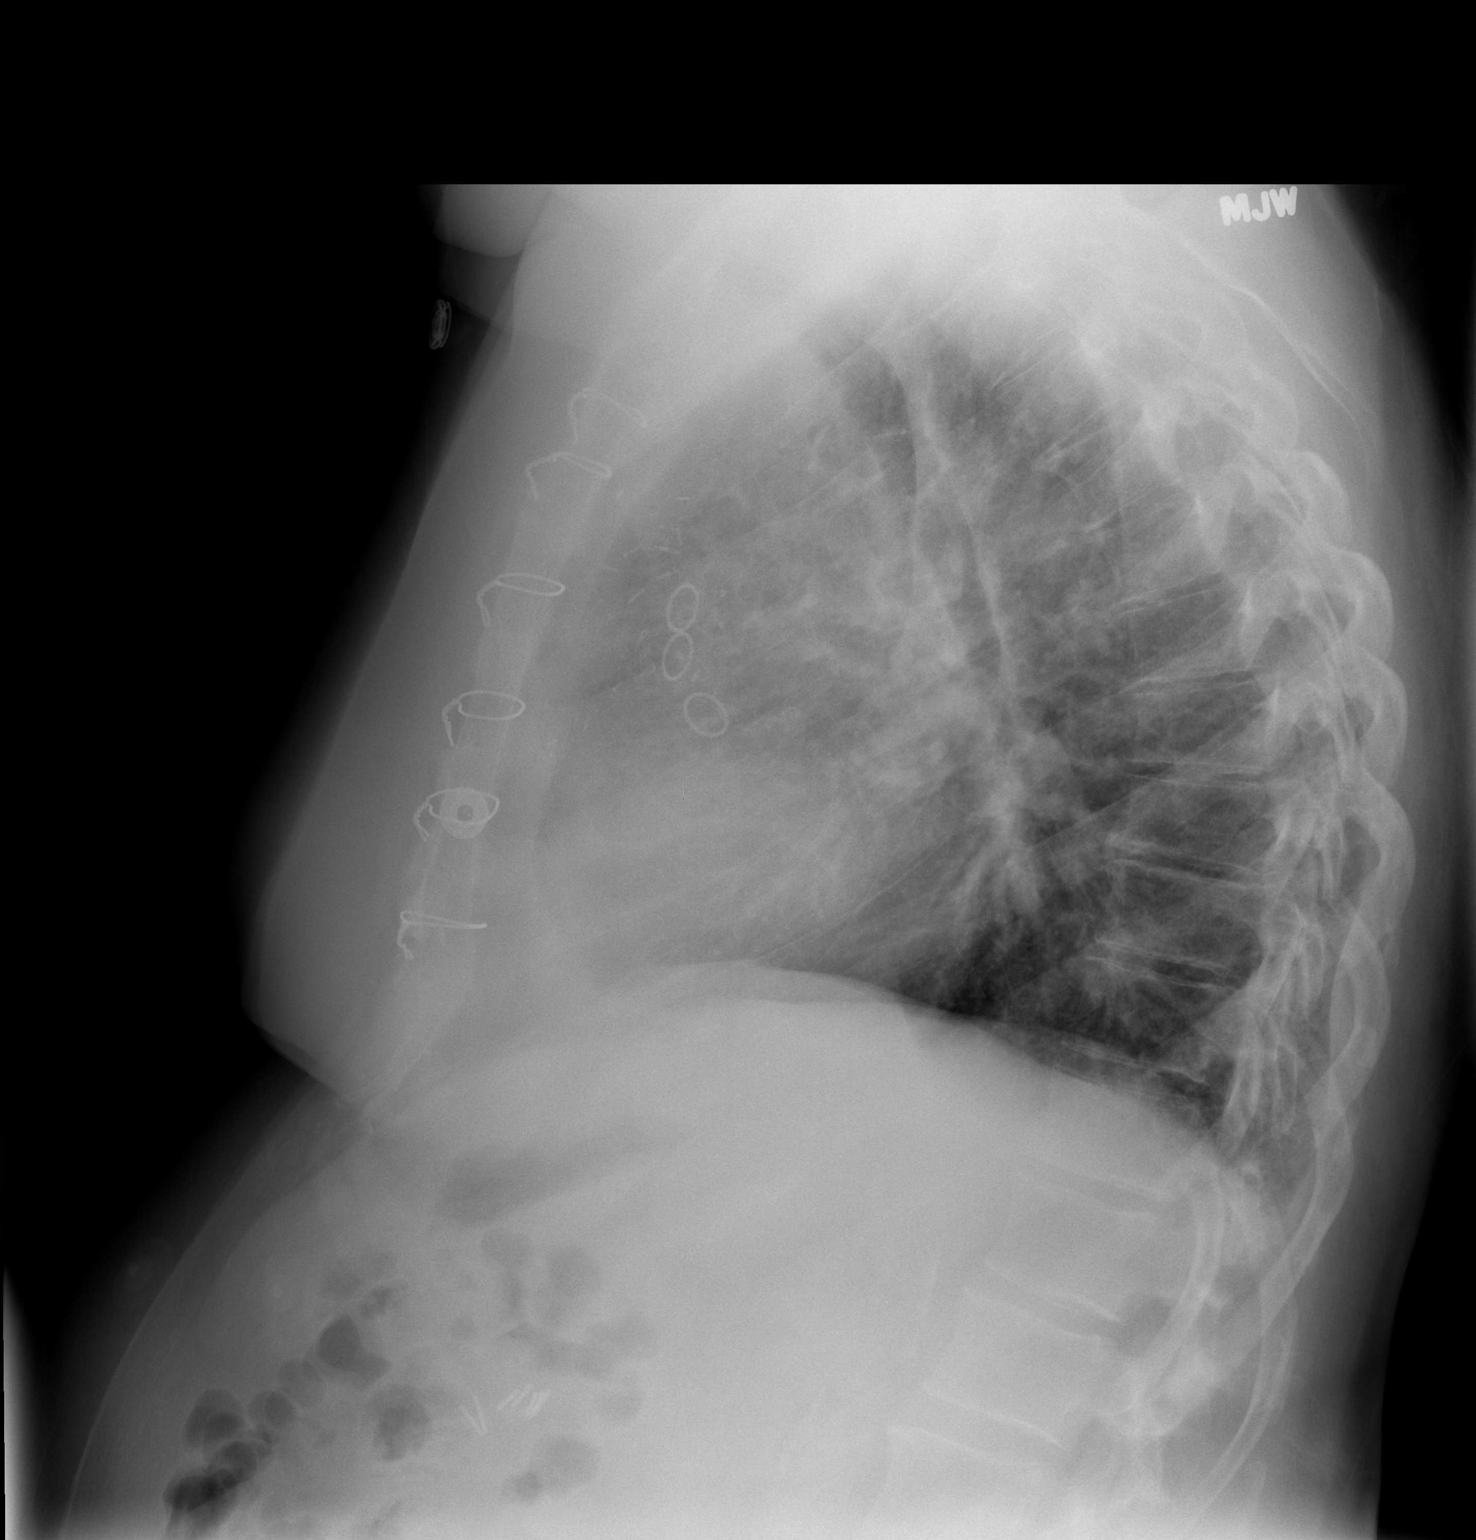

[2 of 2 positions shown; findings below may reference images not displayed]

FINDINGS: The lungs are adequately inflated. There is no focal infiltrate. The
interstitial markings are mildly prominent bilaterally but stable.
The cardiopericardial silhouette is normal in size. The pulmonary
vascularity is not engorged. The patient has undergone previous
CABG. There is no pleural effusion or pneumothorax. The trachea is
midline. The observed portions of the bony thorax exhibit no acute
abnormalities.
IMPRESSION: 1. There is mild stable prominence of the pulmonary interstitial
markings in the setting of enlargement of the cardiac silhouette.
The possibility of low-grade compensated CHF is raised.
2. There is no evidence of pneumonia nor pleural effusion nor
pneumothorax.

## 2015-04-24 IMAGING — CR DG CHEST 1V PORT
1 series · 1 of 1 positions shown · non-contrast
Comparison: PA and lateral chest 05/29/2013 and 11/25/2012.

CLINICAL DATA: Chest discomfort.

EXAM:
PORTABLE CHEST - 1 VIEW

[AP]
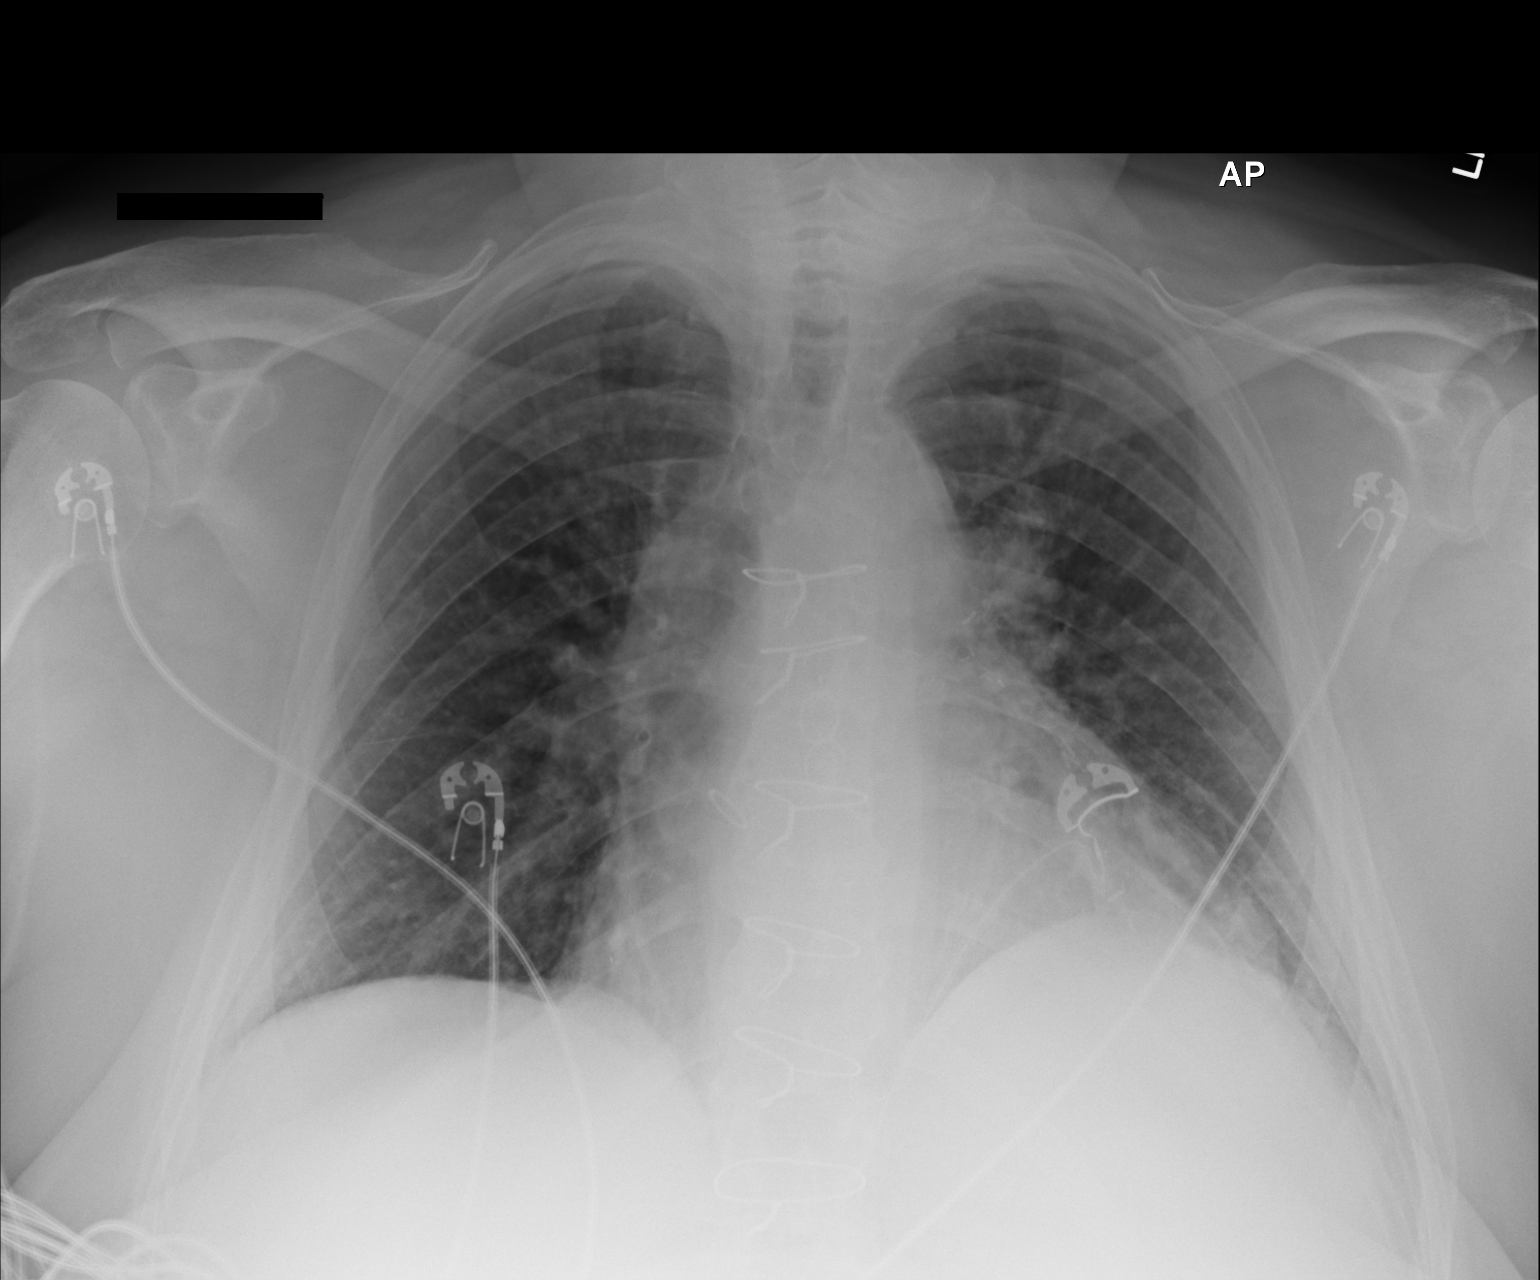

[1 of 1 positions shown; findings below may reference images not displayed]

FINDINGS: The patient is status post CABG. There is cardiomegaly without
edema. Minimal atelectasis left lung base noted. The lungs are
otherwise clear. No pneumothorax or pleural effusion.
IMPRESSION: Cardiomegaly without acute disease.

## 2015-04-28 ENCOUNTER — Other Ambulatory Visit: Payer: Self-pay | Admitting: Cardiology

## 2015-12-18 DEATH — deceased
